# Patient Record
Sex: Female | Born: 1965 | Race: Black or African American | Hispanic: No | Marital: Married | Smoking: Never smoker
Health system: Southern US, Community
[De-identification: ages and names within clinical notes are randomized; demographics above are authoritative.]

## PROBLEM LIST (undated history)

## (undated) DIAGNOSIS — E669 Obesity, unspecified: Secondary | ICD-10-CM

## (undated) DIAGNOSIS — N83209 Unspecified ovarian cyst, unspecified side: Secondary | ICD-10-CM

## (undated) DIAGNOSIS — G4700 Insomnia, unspecified: Secondary | ICD-10-CM

## (undated) DIAGNOSIS — K922 Gastrointestinal hemorrhage, unspecified: Secondary | ICD-10-CM

## (undated) DIAGNOSIS — D649 Anemia, unspecified: Secondary | ICD-10-CM

## (undated) HISTORY — PX: TUBAL LIGATION: SHX77

## (undated) HISTORY — DX: Gastrointestinal hemorrhage, unspecified: K92.2

## (undated) HISTORY — DX: Insomnia, unspecified: G47.00

## (undated) HISTORY — DX: Unspecified ovarian cyst, unspecified side: N83.209

## (undated) HISTORY — DX: Obesity, unspecified: E66.9

## (undated) HISTORY — PX: TONSILLECTOMY: SUR1361

## (undated) HISTORY — PX: GASTRIC BYPASS: SHX52

## (undated) HISTORY — DX: Anemia, unspecified: D64.9

## (undated) HISTORY — PX: UPPER GI ENDOSCOPY: SHX6162

---

## 2004-04-28 ENCOUNTER — Ambulatory Visit: Payer: Self-pay | Admitting: Family Medicine

## 2005-02-19 ENCOUNTER — Ambulatory Visit: Payer: Self-pay

## 2006-12-24 ENCOUNTER — Ambulatory Visit: Payer: Self-pay | Admitting: Family Medicine

## 2006-12-27 ENCOUNTER — Ambulatory Visit: Payer: Self-pay | Admitting: Family Medicine

## 2007-01-31 ENCOUNTER — Ambulatory Visit: Payer: Self-pay | Admitting: Surgery

## 2007-07-10 HISTORY — PX: CHOLECYSTECTOMY: SHX55

## 2007-09-12 ENCOUNTER — Ambulatory Visit: Payer: Self-pay | Admitting: Obstetrics and Gynecology

## 2008-09-17 ENCOUNTER — Ambulatory Visit: Payer: Self-pay | Admitting: Obstetrics and Gynecology

## 2009-09-23 ENCOUNTER — Ambulatory Visit: Payer: Self-pay | Admitting: Obstetrics and Gynecology

## 2010-07-07 ENCOUNTER — Ambulatory Visit: Payer: Self-pay | Admitting: Unknown Physician Specialty

## 2010-07-14 ENCOUNTER — Ambulatory Visit: Payer: Self-pay | Admitting: Unknown Physician Specialty

## 2010-09-29 ENCOUNTER — Ambulatory Visit: Payer: Self-pay | Admitting: Obstetrics and Gynecology

## 2011-01-03 ENCOUNTER — Ambulatory Visit: Payer: Self-pay | Admitting: Family Medicine

## 2011-07-13 ENCOUNTER — Ambulatory Visit: Payer: Self-pay | Admitting: Internal Medicine

## 2011-08-17 ENCOUNTER — Ambulatory Visit: Payer: Self-pay | Admitting: Internal Medicine

## 2011-08-17 LAB — CANCER CENTER HEMOGLOBIN: HGB: 10 g/dL — ABNORMAL LOW (ref 12.0–16.0)

## 2011-09-07 ENCOUNTER — Ambulatory Visit: Payer: Self-pay | Admitting: Internal Medicine

## 2011-10-08 ENCOUNTER — Ambulatory Visit: Payer: Self-pay | Admitting: Internal Medicine

## 2011-10-26 ENCOUNTER — Ambulatory Visit: Payer: Self-pay | Admitting: Obstetrics and Gynecology

## 2011-10-31 ENCOUNTER — Ambulatory Visit: Payer: Self-pay | Admitting: Obstetrics and Gynecology

## 2011-11-09 ENCOUNTER — Ambulatory Visit: Payer: Self-pay | Admitting: Internal Medicine

## 2011-11-09 LAB — CBC CANCER CENTER
HCT: 31.3 % — ABNORMAL LOW (ref 35.0–47.0)
HGB: 9.9 g/dL — ABNORMAL LOW (ref 12.0–16.0)
Lymphocyte %: 28.5 %
MCH: 26.3 pg (ref 26.0–34.0)
MCHC: 31.8 g/dL — ABNORMAL LOW (ref 32.0–36.0)
MCV: 83 fL (ref 80–100)
Monocyte %: 10.8 %
Platelet: 219 x10 3/mm (ref 150–440)
RBC: 3.77 10*6/uL — ABNORMAL LOW (ref 3.80–5.20)
RDW: 16.1 % — ABNORMAL HIGH (ref 11.5–14.5)
WBC: 3.2 x10 3/mm — ABNORMAL LOW (ref 3.6–11.0)

## 2011-12-08 ENCOUNTER — Ambulatory Visit: Payer: Self-pay | Admitting: Internal Medicine

## 2011-12-21 LAB — CANCER CENTER HEMOGLOBIN: HGB: 11.7 g/dL — ABNORMAL LOW (ref 12.0–16.0)

## 2012-01-07 ENCOUNTER — Ambulatory Visit: Payer: Self-pay | Admitting: Internal Medicine

## 2012-02-01 LAB — CBC CANCER CENTER
Basophil #: 0 x10 3/mm (ref 0.0–0.1)
HCT: 38.3 % (ref 35.0–47.0)
Lymphocyte #: 0.9 x10 3/mm — ABNORMAL LOW (ref 1.0–3.6)
MCH: 30 pg (ref 26.0–34.0)
MCHC: 32.9 g/dL (ref 32.0–36.0)
MCV: 91 fL (ref 80–100)
Monocyte #: 0.3 x10 3/mm (ref 0.2–0.9)
Neutrophil %: 51.7 %
Platelet: 209 x10 3/mm (ref 150–440)
RBC: 4.2 10*6/uL (ref 3.80–5.20)
RDW: 16.4 % — ABNORMAL HIGH (ref 11.5–14.5)
WBC: 2.6 x10 3/mm — ABNORMAL LOW (ref 3.6–11.0)

## 2012-02-01 LAB — FERRITIN: Ferritin (ARMC): 19 ng/mL (ref 8–388)

## 2012-02-07 ENCOUNTER — Ambulatory Visit: Payer: Self-pay

## 2012-02-07 ENCOUNTER — Ambulatory Visit: Payer: Self-pay | Admitting: Internal Medicine

## 2012-03-09 ENCOUNTER — Ambulatory Visit: Payer: Self-pay | Admitting: Internal Medicine

## 2012-03-28 LAB — FERRITIN: Ferritin (ARMC): 22 ng/mL (ref 8–388)

## 2012-03-28 LAB — CANCER CENTER HEMOGLOBIN: HGB: 12.1 g/dL (ref 12.0–16.0)

## 2012-04-08 ENCOUNTER — Ambulatory Visit: Payer: Self-pay | Admitting: Internal Medicine

## 2012-04-25 LAB — FERRITIN: Ferritin (ARMC): 15 ng/mL (ref 8–388)

## 2012-04-25 LAB — CANCER CENTER HEMOGLOBIN: HGB: 12.1 g/dL (ref 12.0–16.0)

## 2012-05-09 ENCOUNTER — Ambulatory Visit: Payer: Self-pay | Admitting: Internal Medicine

## 2012-05-30 LAB — CBC CANCER CENTER
Basophil %: 0.6 %
Eosinophil #: 0.1 x10 3/mm (ref 0.0–0.7)
Eosinophil %: 3.3 %
HGB: 12.6 g/dL (ref 12.0–16.0)
Lymphocyte %: 17.9 %
MCH: 30.2 pg (ref 26.0–34.0)
MCV: 94 fL (ref 80–100)
Monocyte #: 0.4 x10 3/mm (ref 0.2–0.9)
Monocyte %: 9.8 %
Neutrophil %: 68.4 %
RBC: 4.15 10*6/uL (ref 3.80–5.20)
RDW: 13 % (ref 11.5–14.5)
WBC: 4.4 x10 3/mm (ref 3.6–11.0)

## 2012-05-30 LAB — FERRITIN: Ferritin (ARMC): 13 ng/mL (ref 8–388)

## 2012-05-30 LAB — APTT: Activated PTT: 26.4 secs (ref 23.6–35.9)

## 2012-06-08 ENCOUNTER — Ambulatory Visit: Payer: Self-pay | Admitting: Internal Medicine

## 2012-06-20 LAB — CANCER CENTER HEMOGLOBIN: HGB: 12 g/dL (ref 12.0–16.0)

## 2012-07-09 ENCOUNTER — Ambulatory Visit: Payer: Self-pay | Admitting: Internal Medicine

## 2012-07-09 HISTORY — PX: COLONOSCOPY: SHX174

## 2012-07-18 LAB — CANCER CENTER HEMOGLOBIN: HGB: 12.7 g/dL (ref 12.0–16.0)

## 2012-08-09 ENCOUNTER — Ambulatory Visit: Payer: Self-pay | Admitting: Internal Medicine

## 2012-08-15 LAB — CANCER CENTER HEMOGLOBIN: HGB: 12.5 g/dL (ref 12.0–16.0)

## 2012-09-06 ENCOUNTER — Ambulatory Visit: Payer: Self-pay | Admitting: Internal Medicine

## 2012-10-07 ENCOUNTER — Ambulatory Visit: Payer: Self-pay | Admitting: Internal Medicine

## 2012-10-10 LAB — FERRITIN: Ferritin (ARMC): 7 ng/mL — ABNORMAL LOW (ref 8–388)

## 2012-10-10 LAB — CANCER CENTER HEMOGLOBIN: HGB: 11.8 g/dL — ABNORMAL LOW (ref 12.0–16.0)

## 2012-11-06 ENCOUNTER — Ambulatory Visit: Payer: Self-pay | Admitting: Internal Medicine

## 2012-12-05 ENCOUNTER — Ambulatory Visit: Payer: Self-pay | Admitting: Obstetrics and Gynecology

## 2012-12-07 ENCOUNTER — Ambulatory Visit: Payer: Self-pay | Admitting: Internal Medicine

## 2012-12-16 ENCOUNTER — Ambulatory Visit: Payer: Self-pay | Admitting: Obstetrics and Gynecology

## 2013-01-06 ENCOUNTER — Ambulatory Visit: Payer: Self-pay | Admitting: Internal Medicine

## 2013-02-06 ENCOUNTER — Ambulatory Visit: Payer: Self-pay | Admitting: Internal Medicine

## 2013-03-05 ENCOUNTER — Ambulatory Visit: Payer: Self-pay | Admitting: Internal Medicine

## 2013-03-06 LAB — CREATININE, SERUM: Creatinine: 0.7 mg/dL (ref 0.60–1.30)

## 2013-03-06 LAB — CANCER CENTER HEMOGLOBIN: HGB: 10.8 g/dL — ABNORMAL LOW (ref 12.0–16.0)

## 2013-03-09 ENCOUNTER — Ambulatory Visit: Payer: Self-pay | Admitting: Internal Medicine

## 2013-03-16 ENCOUNTER — Other Ambulatory Visit (HOSPITAL_COMMUNITY): Payer: Self-pay | Admitting: Internal Medicine

## 2013-03-16 DIAGNOSIS — R922 Inconclusive mammogram: Secondary | ICD-10-CM

## 2013-03-20 ENCOUNTER — Inpatient Hospital Stay: Payer: Self-pay | Admitting: Internal Medicine

## 2013-03-20 LAB — FERRITIN: Ferritin (ARMC): 132 ng/mL (ref 8–388)

## 2013-03-20 LAB — COMPREHENSIVE METABOLIC PANEL
Albumin: 3 g/dL — ABNORMAL LOW (ref 3.4–5.0)
Anion Gap: 6 — ABNORMAL LOW (ref 7–16)
BUN: 20 mg/dL — ABNORMAL HIGH (ref 7–18)
Bilirubin,Total: 0.1 mg/dL — ABNORMAL LOW (ref 0.2–1.0)
Calcium, Total: 8.4 mg/dL — ABNORMAL LOW (ref 8.5–10.1)
Co2: 25 mmol/L (ref 21–32)
EGFR (African American): >60
EGFR (Non-African Amer.): >60
Glucose: 94 mg/dL (ref 65–99)
Potassium: 3.6 mmol/L (ref 3.5–5.1)
Sodium: 138 mmol/L (ref 136–145)

## 2013-03-20 LAB — CBC CANCER CENTER
Basophil #: 0 x10 3/mm (ref 0.0–0.1)
Basophil %: 0.7 %
Eosinophil #: 0.1 x10 3/mm (ref 0.0–0.7)
Eosinophil %: 2.9 %
HGB: 7.6 g/dL — ABNORMAL LOW (ref 12.0–16.0)
Lymphocyte #: 1.1 x10 3/mm (ref 1.0–3.6)
MCV: 89 fL (ref 80–100)
Monocyte %: 7.6 %
Neutrophil #: 3.3 x10 3/mm (ref 1.4–6.5)
Neutrophil %: 66.1 %
Platelet: 183 x10 3/mm (ref 150–440)
RBC: 2.56 10*6/uL — ABNORMAL LOW (ref 3.80–5.20)
RDW: 17.3 % — ABNORMAL HIGH (ref 11.5–14.5)
WBC: 4.9 x10 3/mm (ref 3.6–11.0)

## 2013-03-20 LAB — PROTIME-INR
INR: 0.9
Prothrombin Time: 12.8 secs (ref 11.5–14.7)

## 2013-03-20 LAB — IRON AND TIBC
Iron Bind.Cap.(Total): 357 ug/dL (ref 250–450)
Iron Saturation: 20 %
Iron: 72 ug/dL (ref 50–170)
Unbound Iron-Bind.Cap.: 285 ug/dL

## 2013-03-20 LAB — HEMOGLOBIN: HGB: 7.2 g/dL — ABNORMAL LOW (ref 12.0–16.0)

## 2013-03-21 LAB — OCCULT BLOOD X 1 CARD TO LAB, STOOL: Occult Blood, Feces: POSITIVE

## 2013-03-21 LAB — HEMOGLOBIN
HGB: 6.4 g/dL — ABNORMAL LOW (ref 12.0–16.0)
HGB: 8.3 g/dL — ABNORMAL LOW (ref 12.0–16.0)

## 2013-03-22 LAB — CBC WITH DIFFERENTIAL/PLATELET
Basophil #: 0 10*3/uL (ref 0.0–0.1)
Basophil %: 0.3 %
Eosinophil #: 0.1 10*3/uL (ref 0.0–0.7)
Eosinophil %: 2.1 %
HCT: 21.6 % — ABNORMAL LOW (ref 35.0–47.0)
HGB: 7.4 g/dL — ABNORMAL LOW (ref 12.0–16.0)
MCHC: 34.1 g/dL (ref 32.0–36.0)
Monocyte %: 8.2 %
Neutrophil #: 4.2 10*3/uL (ref 1.4–6.5)
RBC: 2.52 10*6/uL — ABNORMAL LOW (ref 3.80–5.20)
WBC: 5.8 10*3/uL (ref 3.6–11.0)

## 2013-03-22 LAB — BASIC METABOLIC PANEL
Anion Gap: 4 — ABNORMAL LOW (ref 7–16)
BUN: 13 mg/dL (ref 7–18)
Calcium, Total: 7.3 mg/dL — ABNORMAL LOW (ref 8.5–10.1)
Chloride: 113 mmol/L — ABNORMAL HIGH (ref 98–107)
Co2: 25 mmol/L (ref 21–32)
Creatinine: 0.66 mg/dL (ref 0.60–1.30)
EGFR (African American): >60
EGFR (Non-African Amer.): >60
Potassium: 3.6 mmol/L (ref 3.5–5.1)

## 2013-03-22 LAB — HEMOGLOBIN: HGB: 8.7 g/dL — ABNORMAL LOW (ref 12.0–16.0)

## 2013-03-23 LAB — CBC WITH DIFFERENTIAL/PLATELET
Eosinophil %: 3.3 %
HGB: 9.1 g/dL — ABNORMAL LOW (ref 12.0–16.0)
Lymphocyte %: 22 %
MCHC: 34.7 g/dL (ref 32.0–36.0)
MCV: 87 fL (ref 80–100)
Monocyte #: 0.4 x10 3/mm (ref 0.2–0.9)
Neutrophil #: 3.5 10*3/uL (ref 1.4–6.5)
Neutrophil %: 67.1 %
RBC: 3.03 10*6/uL — ABNORMAL LOW (ref 3.80–5.20)
RDW: 17.7 % — ABNORMAL HIGH (ref 11.5–14.5)

## 2013-03-23 LAB — BASIC METABOLIC PANEL
Anion Gap: 6 — ABNORMAL LOW (ref 7–16)
BUN: 5 mg/dL — ABNORMAL LOW (ref 7–18)
Calcium, Total: 7.7 mg/dL — ABNORMAL LOW (ref 8.5–10.1)
Chloride: 112 mmol/L — ABNORMAL HIGH (ref 98–107)
Co2: 24 mmol/L (ref 21–32)
EGFR (African American): >60
EGFR (Non-African Amer.): >60
Glucose: 90 mg/dL (ref 65–99)
Potassium: 3.1 mmol/L — ABNORMAL LOW (ref 3.5–5.1)

## 2013-03-23 LAB — HEMOGLOBIN: HGB: 9.4 g/dL — ABNORMAL LOW (ref 12.0–16.0)

## 2013-03-23 LAB — PREGNANCY, URINE: Pregnancy Test, Urine: NEGATIVE m[IU]/mL

## 2013-03-24 LAB — CBC WITH DIFFERENTIAL/PLATELET
Basophil #: 0 10*3/uL (ref 0.0–0.1)
Basophil %: 0.5 %
Eosinophil #: 0.2 10*3/uL (ref 0.0–0.7)
Eosinophil %: 2 %
HCT: 27.2 % — ABNORMAL LOW (ref 35.0–47.0)
Lymphocyte #: 0.8 10*3/uL — ABNORMAL LOW (ref 1.0–3.6)
Lymphocyte %: 10.6 %
MCH: 30.3 pg (ref 26.0–34.0)
MCHC: 34.5 g/dL (ref 32.0–36.0)
MCV: 88 fL (ref 80–100)
Monocyte #: 0.4 x10 3/mm (ref 0.2–0.9)
Monocyte %: 5.5 %
Neutrophil #: 6.3 10*3/uL (ref 1.4–6.5)
Neutrophil %: 81.4 %
Platelet: 189 10*3/uL (ref 150–440)
WBC: 7.7 10*3/uL (ref 3.6–11.0)

## 2013-03-26 LAB — CANCER CENTER HEMOGLOBIN: HGB: 9.9 g/dL — ABNORMAL LOW (ref 12.0–16.0)

## 2013-03-26 LAB — POTASSIUM: Potassium: 3.5 mmol/L (ref 3.5–5.1)

## 2013-03-26 LAB — MAGNESIUM: Magnesium: 2.1 mg/dL

## 2013-03-30 LAB — CANCER CENTER HEMOGLOBIN: HGB: 10.3 g/dL — ABNORMAL LOW (ref 12.0–16.0)

## 2013-03-30 LAB — POTASSIUM: Potassium: 3.9 mmol/L (ref 3.5–5.1)

## 2013-04-03 LAB — CANCER CENTER HEMOGLOBIN: HGB: 10.4 g/dL — ABNORMAL LOW (ref 12.0–16.0)

## 2013-04-03 LAB — FERRITIN: Ferritin (ARMC): 107 ng/mL (ref 8–388)

## 2013-04-08 ENCOUNTER — Ambulatory Visit: Payer: Self-pay | Admitting: Internal Medicine

## 2013-04-10 LAB — CANCER CENTER HEMOGLOBIN: HGB: 9.8 g/dL — ABNORMAL LOW (ref 12.0–16.0)

## 2013-04-10 LAB — POTASSIUM: Potassium: 3.7 mmol/L (ref 3.5–5.1)

## 2013-04-24 LAB — CANCER CENTER HEMOGLOBIN: HGB: 10.7 g/dL — ABNORMAL LOW (ref 12.0–16.0)

## 2013-04-24 LAB — POTASSIUM: Potassium: 4.1 mmol/L (ref 3.5–5.1)

## 2013-05-09 ENCOUNTER — Ambulatory Visit: Payer: Self-pay | Admitting: Internal Medicine

## 2013-05-15 LAB — FERRITIN: Ferritin (ARMC): 6 ng/mL — ABNORMAL LOW

## 2013-05-15 LAB — POTASSIUM: Potassium: 3.5 mmol/L (ref 3.5–5.1)

## 2013-05-29 LAB — POTASSIUM: Potassium: 3.5 mmol/L (ref 3.5–5.1)

## 2013-06-08 ENCOUNTER — Ambulatory Visit: Payer: Self-pay | Admitting: Internal Medicine

## 2013-06-09 ENCOUNTER — Other Ambulatory Visit: Payer: Self-pay | Admitting: Unknown Physician Specialty

## 2013-06-09 DIAGNOSIS — D509 Iron deficiency anemia, unspecified: Secondary | ICD-10-CM

## 2013-06-09 DIAGNOSIS — K639 Disease of intestine, unspecified: Secondary | ICD-10-CM

## 2013-06-12 LAB — CREATININE, SERUM
Creatinine: 0.75 mg/dL (ref 0.60–1.30)
EGFR (African American): >60

## 2013-06-12 LAB — FERRITIN: Ferritin (ARMC): 92 ng/mL (ref 8–388)

## 2013-06-12 LAB — POTASSIUM: Potassium: 4.1 mmol/L (ref 3.5–5.1)

## 2013-06-29 LAB — CANCER CENTER HEMOGLOBIN: HGB: 12 g/dL (ref 12.0–16.0)

## 2013-07-09 ENCOUNTER — Ambulatory Visit: Payer: Self-pay | Admitting: Internal Medicine

## 2013-07-09 DIAGNOSIS — K922 Gastrointestinal hemorrhage, unspecified: Secondary | ICD-10-CM

## 2013-07-09 HISTORY — PX: GASTRIC RESECTION: SHX5248

## 2013-07-09 HISTORY — DX: Gastrointestinal hemorrhage, unspecified: K92.2

## 2013-08-07 LAB — FERRITIN: FERRITIN (ARMC): 24 ng/mL (ref 8–388)

## 2013-08-07 LAB — CANCER CENTER HEMOGLOBIN: HGB: 12.7 g/dL (ref 12.0–16.0)

## 2013-08-09 ENCOUNTER — Ambulatory Visit: Payer: Self-pay | Admitting: Internal Medicine

## 2013-08-21 ENCOUNTER — Ambulatory Visit: Payer: Self-pay | Admitting: Unknown Physician Specialty

## 2013-08-21 LAB — HM COLONOSCOPY

## 2013-08-26 LAB — PATHOLOGY REPORT

## 2013-08-28 LAB — FERRITIN: FERRITIN (ARMC): 22 ng/mL (ref 8–388)

## 2013-08-28 LAB — CANCER CENTER HEMOGLOBIN: HGB: 12.5 g/dL (ref 12.0–16.0)

## 2013-09-06 ENCOUNTER — Ambulatory Visit: Payer: Self-pay | Admitting: Family Medicine

## 2013-09-06 ENCOUNTER — Ambulatory Visit: Payer: Self-pay | Admitting: Internal Medicine

## 2013-10-08 ENCOUNTER — Ambulatory Visit: Payer: Self-pay | Admitting: Internal Medicine

## 2013-10-09 LAB — FERRITIN: Ferritin (ARMC): 153 ng/mL (ref 8–388)

## 2013-10-09 LAB — CANCER CENTER HEMOGLOBIN: HGB: 12.8 g/dL (ref 12.0–16.0)

## 2013-10-16 LAB — LIPID PANEL
Cholesterol: 133 mg/dL (ref 0–200)
HDL: 59 mg/dL (ref 35–70)
LDL Cholesterol: 65 mg/dL
Triglycerides: 47 mg/dL (ref 40–160)

## 2013-10-16 LAB — HEPATIC FUNCTION PANEL
ALT: 20 U/L (ref 7–35)
AST: 22 U/L (ref 13–35)

## 2013-10-16 LAB — BASIC METABOLIC PANEL
BUN: 13 mg/dL (ref 4–21)
Creatinine: 0.8 mg/dL (ref <=1.1)
GLUCOSE: 82 mg/dL
POTASSIUM: 3.9 mmol/L (ref 3.4–5.3)
Sodium: 140 mmol/L (ref 137–147)

## 2013-11-06 ENCOUNTER — Ambulatory Visit: Payer: Self-pay | Admitting: Internal Medicine

## 2013-11-27 LAB — FERRITIN: Ferritin (ARMC): 65 ng/mL (ref 8–388)

## 2013-11-27 LAB — CANCER CENTER HEMOGLOBIN: HGB: 12.9 g/dL (ref 12.0–16.0)

## 2013-12-07 ENCOUNTER — Ambulatory Visit: Payer: Self-pay | Admitting: Internal Medicine

## 2013-12-11 ENCOUNTER — Ambulatory Visit: Payer: Self-pay | Admitting: Obstetrics and Gynecology

## 2013-12-18 ENCOUNTER — Ambulatory Visit: Payer: Self-pay | Admitting: Obstetrics and Gynecology

## 2014-01-15 ENCOUNTER — Ambulatory Visit: Payer: Self-pay | Admitting: Internal Medicine

## 2014-01-15 LAB — CANCER CENTER HEMOGLOBIN: HGB: 12.9 g/dL (ref 12.0–16.0)

## 2014-01-15 LAB — FERRITIN: Ferritin (ARMC): 61 ng/mL (ref 8–388)

## 2014-01-20 ENCOUNTER — Encounter: Payer: Self-pay | Admitting: *Deleted

## 2014-02-02 ENCOUNTER — Other Ambulatory Visit (INDEPENDENT_AMBULATORY_CARE_PROVIDER_SITE_OTHER): Payer: BC Managed Care – PPO

## 2014-02-02 ENCOUNTER — Ambulatory Visit (INDEPENDENT_AMBULATORY_CARE_PROVIDER_SITE_OTHER): Payer: BC Managed Care – PPO | Admitting: General Surgery

## 2014-02-02 ENCOUNTER — Encounter: Payer: Self-pay | Admitting: General Surgery

## 2014-02-02 ENCOUNTER — Other Ambulatory Visit: Payer: BC Managed Care – PPO

## 2014-02-02 VITALS — BP 120/80 | HR 74 | Resp 12 | Ht 64.0 in | Wt 152.0 lb

## 2014-02-02 DIAGNOSIS — N63 Unspecified lump in unspecified breast: Secondary | ICD-10-CM

## 2014-02-02 DIAGNOSIS — N6002 Solitary cyst of left breast: Secondary | ICD-10-CM

## 2014-02-02 HISTORY — PX: BREAST CYST ASPIRATION: SHX578

## 2014-02-02 NOTE — Progress Notes (Signed)
Patient ID: Laura Murray, female   DOB: 04/27/1966, 48 y.o.   MRN: [id number]  Chief Complaint  Patient presents with  . Other    New Patient evaluation of left breast mass    HPI Laura RiddleJacqueline C Bourne is a 48 y.o. female who presents for a breast evaluation. The most recent mammogram was done on 12/18/13 as well as a left breast ultrasound on 12/18/13 Patient does perform regular self breast checks and gets regular mammograms done. She was aware of any abnormalities on her on self-exam. She reports no history of trauma.  Assessment was requested by her Laura Murray M.D. Who follows the patient for anemia. The anemia is thought secondary to her previously completed the gastric bypass.     HPI  Past Medical History  Diagnosis Date  . Anemia   . Upper GI bleeding 2015    gastric ulcer    Past Surgical History  Procedure Laterality Date  . Tubal ligation    . Tonsillectomy    . Cholecystectomy  2009  . Gastric bypass  2006  . Upper gi endoscopy    . Colonoscopy  2014    Family History  Problem Relation Age of Onset  . Breast cancer Maternal Aunt     [id number]  . Colon cancer Maternal Uncle   . Lung cancer Maternal Grandfather     Social History History  Substance Use Topics  . Smoking status: Never Smoker   . Smokeless tobacco: Never Used  . Alcohol Use: No    No Known Allergies  Current Outpatient Prescriptions  Medication Sig Dispense Refill  . Vitamin D, Ergocalciferol, (DRISDOL) 50000 UNITS CAPS capsule Take 50,000 Units by mouth every 7 (seven) days.       No current facility-administered medications for this visit.    Review of Systems Review of Systems  Constitutional: Negative.   Respiratory: Negative.   Cardiovascular: Negative.     Blood pressure 120/80, pulse 74, resp. rate 12, height 5\' 4"  (1.626 m), weight 152 lb (68.947 kg), last menstrual period 12/21/2013.  Physical Exam Physical Exam  Constitutional: She is oriented to person, place,  and time. She appears well-developed and well-nourished.  Eyes: Conjunctivae are normal. No scleral icterus.  Neck: Thyromegaly present.  Cardiovascular: Regular rhythm.   Murmur heard.  Systolic murmur is present with a grade of 2/6  Pulmonary/Chest: Effort normal and breath sounds normal. Right breast exhibits no inverted nipple, no mass, no nipple discharge, no skin change and no tenderness. Left breast exhibits no inverted nipple, no mass, no nipple discharge, no skin change and no tenderness.  Lymphadenopathy:    She has no cervical adenopathy.    She has no axillary adenopathy.  Neurological: She is alert and oriented to person, place, and time.  Skin: Skin is warm and dry.    Data Reviewed Screening mammograms dated December 11, 2013 suggested a mass in the left breast. BI-RAD-0.  Focal spot compression views and ultrasound of the left breast dated December 18, 2013 showed persistent density in the lower outer quadrant of the breast with poorly defined margins. Ultrasound examination showed a 5 x 9 x 10 cm oval horizontally oriented circumscribed near anechoic masses at 3:30 o'clock position 2 cm and the nipple this was thought to account for the mammographic finding. BI-RAD-2.  Because of concern on the patient's part having been called back and the question raised by her treating hematologist she desired to have aspiration to confirm that the lesion was  indeed a cyst.  Ultrasound examination of the left breast was notable for multiple lesions. At the 2:30 o'clock position 3 cm in the nipple 2 side-by-side lesions were identified. The dominantlesion measuring 0.35 x 0.7 x 0.81 cm. The smaller cyst measured 0.15 x 0.19 x 0.29 cm. The lesion at 2:00 was abutting the pectoralis fascia and it was difficult to confirm that this was a simple cyst. Aspiration was subsequently undertaken using 1 cc of 1% plain Xylocaine with complete resolution of return of a small lime of clear fluid which was  discarded.   At the 4 o'clock position a 0.5 x 0.9 x 1.0 cm lesion 3 cm from the nipple correlated with the mammographic abnormality.  BI-RAD-2.  Assessment    Asymptomatic breast cysts, result of aspiration.     Plan    The patient will resume annual screening mammograms in summer 2016.     PCP. Dr. Unk Pinto, Merrily Pew 02/03/2014, 10:22 PM   aa

## 2014-02-03 ENCOUNTER — Encounter: Payer: Self-pay | Admitting: General Surgery

## 2014-02-03 DIAGNOSIS — N6009 Solitary cyst of unspecified breast: Secondary | ICD-10-CM | POA: Insufficient documentation

## 2014-02-06 ENCOUNTER — Ambulatory Visit: Payer: Self-pay | Admitting: Internal Medicine

## 2014-03-11 ENCOUNTER — Ambulatory Visit: Payer: Self-pay | Admitting: Internal Medicine

## 2014-03-12 LAB — APTT: ACTIVATED PTT: 28.9 s (ref 23.6–35.9)

## 2014-03-12 LAB — PLATELET FUNCTION ASSAY
COL/ADP PLT FXN SCRN: 63 s (ref 0–100)
COL/EPI PLT FXN SCRN: 75 s

## 2014-03-12 LAB — PROTIME-INR
INR: 0.9
Prothrombin Time: 12 secs (ref 11.5–14.7)

## 2014-03-12 LAB — FERRITIN: Ferritin (ARMC): 36 ng/mL (ref 8–388)

## 2014-03-12 LAB — CANCER CENTER HEMOGLOBIN: HGB: 12.3 g/dL (ref 12.0–16.0)

## 2014-04-08 ENCOUNTER — Ambulatory Visit: Payer: Self-pay | Admitting: Internal Medicine

## 2014-04-23 LAB — CBC AND DIFFERENTIAL
HCT: 37 % (ref 36–46)
HEMOGLOBIN: 12.8 g/dL (ref 12.0–16.0)
Platelets: 220 10*3/uL (ref 150–399)
WBC: 3.4 10^3/mL

## 2014-05-03 LAB — CANCER CENTER HEMOGLOBIN: HGB: 12 g/dL (ref 12.0–16.0)

## 2014-05-03 LAB — FERRITIN: FERRITIN (ARMC): 146 ng/mL (ref 8–388)

## 2014-05-09 ENCOUNTER — Ambulatory Visit: Payer: Self-pay | Admitting: Internal Medicine

## 2014-05-10 ENCOUNTER — Encounter: Payer: Self-pay | Admitting: General Surgery

## 2014-05-14 LAB — FERRITIN: FERRITIN (ARMC): 82 ng/mL (ref 8–388)

## 2014-05-14 LAB — CANCER CENTER HEMOGLOBIN: HGB: 10.7 g/dL — ABNORMAL LOW (ref 12.0–16.0)

## 2014-05-21 LAB — CANCER CENTER HEMOGLOBIN: HGB: 11.4 g/dL — AB (ref 12.0–16.0)

## 2014-05-21 LAB — FERRITIN: FERRITIN (ARMC): 508 ng/mL — AB (ref 8–388)

## 2014-06-08 ENCOUNTER — Ambulatory Visit: Payer: Self-pay | Admitting: Internal Medicine

## 2014-06-11 LAB — FERRITIN: FERRITIN (ARMC): 182 ng/mL (ref 8–388)

## 2014-06-11 LAB — CANCER CENTER HEMOGLOBIN: HGB: 11.9 g/dL — ABNORMAL LOW (ref 12.0–16.0)

## 2014-07-09 ENCOUNTER — Ambulatory Visit: Payer: Self-pay | Admitting: Internal Medicine

## 2014-08-13 ENCOUNTER — Ambulatory Visit: Payer: Self-pay | Admitting: Internal Medicine

## 2014-08-13 LAB — FERRITIN: Ferritin (ARMC): 25 ng/mL (ref 8–388)

## 2014-08-13 LAB — CANCER CENTER HEMOGLOBIN: HGB: 12.2 g/dL (ref 12.0–16.0)

## 2014-09-07 ENCOUNTER — Ambulatory Visit
Admit: 2014-09-07 | Disposition: A | Discharge status: Home / Self Care | Payer: Self-pay | Attending: Internal Medicine | Admitting: Internal Medicine

## 2014-10-08 ENCOUNTER — Ambulatory Visit
Admit: 2014-10-08 | Disposition: A | Discharge status: Home / Self Care | Payer: Self-pay | Attending: Internal Medicine | Admitting: Internal Medicine

## 2014-10-08 LAB — CANCER CENTER HEMOGLOBIN: HGB: 12.5 g/dL (ref 12.0–16.0)

## 2014-10-08 LAB — FERRITIN: FERRITIN (ARMC): 106 ng/mL

## 2014-10-29 NOTE — Consult Note (Signed)
Case discussed with Dr, Egbert GaribaldiBird and review of endo pictures.  Anastamotic erosions and ulcerations likely the problems here and the purple areas are not varices but more like erosions.  Her stools are very dark, she did have some fresh blood in anastamotic area on EGD.  Will hold off on colonoscopy and CT for now.  Agree with further transfusions if needed.  Electronic Signatures: Scot JunElliott, Annia Gomm T (MD)  (Signed on 14-Sep-14 11:42)  Authored  Last Updated: 14-Sep-14 11:42 by Scot JunElliott, Elvert Cumpton T (MD)

## 2014-10-29 NOTE — H&P (Signed)
PATIENT NAME:  Laura Murray, Jilian C MR#:  [id number] DATE OF BIRTH:  08/19/1965  HISTORY OF PRESENT ILLNESS: Ms. Laura Murray is a 49 year old patient who is known to me and who was admitted after being seen in the cancer center with signs and symptoms of GI bleed. Ms. Laura Murray has a history of iron deficiency and has been receiving intravenous iron in the past, and her iron deficiency has been attributed to menstrual blood losses and also to status post gastric bypass surgery.   Today she gave me a history that for about 8 to 10 days, her stools had been dark even though she just rarely takes an oral iron pill, and that they were really thick. Her self-description was the stool had a chocolate fudge quality, 3 bowel movements a day, and this was a change in her regular bowel habit.   Ms. Laura Murray admitted to some increased fatigue recently but she had not had or having a dizziness or orthostasis. No shortness of breath or chest pain. No palpitations. No abdominal pain. Not short of breath. See also below, her hemoglobin also was 7.6 compared to 10.8 on August 26.   REVIEW OF SYSTEMS: Denies fever, chills, or sweats. No visual disturbance. No ear or jaw pain. No headache or dizziness. No cough or wheezing. No chest or abdominal pain. No focal weakness. No numbness or tingling of extremities. No back or bone pain. No edema. No dysuria or hematuria. No hot or cold intolerance. No rash or pruritus. She remembers a bruise on the leg about a week ago but has not had other bleeding or bruising.   PAST SURGICAL AND MEDICAL HISTORY: Gastric bypass surgery, also tonsillectomy.  She has had anxiety and insomnia. [facility] The patient has been seen by GI, Dr. Mechele CollinElliott, in the past. The patient is primary care with Dr. Sherrie MustacheFisher, and gynecology is Dr. Logan BoresEvans.   ALLERGIES: No listed.   HOME MEDICATIONS: B12 injections every 8 weeks, a vitamin D3 orally, Ferrex tablets taken rarely,  calcium and vitamin D twice a day, Herbalife supplements at times. The patient has had intravenous ferriheme in the past. The last one was August 26.   FAMILY HISTORY: Noncontributory. A brother had anemia.   SOCIAL HISTORY: No alcohol or tobacco.   PHYSICAL EXAMINATION:  GENERAL: Alert, cooperative, in no distress.  HEENT: No jaundice of the sclerae. No thrush.  LYMPH: No palpable lymph nodes in the neck, supraclavicular, submandibular, axilla.  LUNGS: Clear. No wheezing or rales.  HEART: Regular.  ABDOMEN: Nontender. No palpable mass or organomegaly.  EXTREMITIES: No extremity edema.  NEUROLOGIC: Grossly nonfocal. Cranial nerves intact. There is a fading bruise on the left  thigh.   LABORATORIES: Hemoglobin was 7.6. Platelets were normal. Pro time was pending. Chemistries and stool guaiacs and iron studies are pending.   IMPRESSION AND PLAN: Signs or symptoms of gastrointestinal bleed. Not confirmed yet with stools but, with the description of stools and the hemoglobin down over 3 grams compared to August 26 even with intravenous ferriheme, no suspicion of other causes of anemia. We will guaiac the stools. We will check iron and ferritin. I have consulted Gastroenterology. Will  document the pro time. Will give normal saline and keep n.p.o. Will give Protonix. Give IV fluids, put on telemetry. We will recheck the hemoglobin later tonight and then in the morning.   Will not automatically transfuse with normal pulse, normal blood pressure, no significant symptoms and young  healthy patient, but would transfuse p.r.n. if any clinical changes.   We will also re-evaluate if additional ferriheme should be given this hospitalization. I have discussed transfusion with the patient and obtained the consent in the event that transfusion is to be given. Dr. Mechele Collin will follow up in the morning.     ____________________________ Knute Neu. Lorre Nick, MD rgg:np D: 03/20/2013 18:58:44 ET T: 03/20/2013  21:21:22 ET JOB#: [id number]  cc: Knute Neu. Lorre Nick, MD, <Dictator> Marin Roberts MD ELECTRONICALLY SIGNED 04/08/2013 11:25

## 2014-10-29 NOTE — Consult Note (Signed)
Pt CC GI bleeding.  Pt had brown stool then black stool.  Hgb up to 9.1. Will get CT to see if has cirrhosis and varices around anastamosis in case I have to do EGD for cauterization.    Electronic Signatures: Scot JunElliott, Robert T (MD)  (Signed on 15-Sep-14 12:58)  Authored  Last Updated: 15-Sep-14 12:58 by Scot JunElliott, Robert T (MD)

## 2014-10-29 NOTE — Consult Note (Signed)
CC: GI bleeding.  Pt had drop in hgb over night and went to bathroom and had syncopal spell.  Hgb 7.4, plt ct, 144,  BP low at 98/50 now.  Will likely get CT scan of abd with contrast and consider colonoscopy versus referral to med center.  Electronic Signatures: Scot JunElliott, Robert T (MD)  (Signed on 14-Sep-14 10:01)  Authored  Last Updated: 14-Sep-14 10:01 by Scot JunElliott, Robert T (MD)

## 2014-10-29 NOTE — Consult Note (Signed)
PATIENT NAME:  Laura Murray, Laura Murray MR#:  [id number] DATE OF BIRTH:  10-Jun-1966  DATE OF CONSULTATION:  03/21/2013  CONSULTING PHYSICIAN:  Scot Jun, MD  PRIMARY DOCTOR: Demetrios Isaacs. Sherrie Mustache, MD  GYNECOLOGY DOCTOR: Ricky L. Logan Bores, MD   HISTORY OF PRESENT ILLNESS: The patient is a 49 year old black female who was admitted to the hospital because of fall in hemoglobin and GI bleeding. I was asked to see her in consultation.   The patient been passing dark chocolate fudge-quality stools for the last 8 to 10 days, probably 3 bowel movements a day. She had a fall in hemoglobin. Her hemoglobin was 10.8 on August 26th, and yesterday it was 7.6. Dr. Lorre Nick decided to admit her to the hospital and consulted me.   PAST MEDICAL HISTORY:  1. Gallbladder surgery with Dr. Renda Rolls.  2. Previous endoscopy showing small dilated veins in the anastomosis in 2011. She had a colonoscopy also at [facility] that was negative at that time.  3. She also has had a tonsillectomy. 4. She has had gastric bypass surgery.   Since the bypass surgery, she has lost 100 pounds.   REVIEW OF SYSTEMS: GENERAL: She denies any fever. He has felt weak.  RESPIRATORY: No asthma, wheezing, emphysema, chronic cough or shortness of breath.  CARDIAC: No history of heart disease.  GASTROINTESTINAL: No dysphagia. No vomiting. She does move her bowels, maybe 3 times a day. She is on Herbalife smoothies, and before she started these, she was going 7 or 8 times a day, so the Herbalife smoothies have helped her diarrhea.   ALLERGIES: No known drug allergies.   MEDICATIONS:  1. B12 injection every 8 weeks.  2. Vitamin D3 3. Iron tablets, which she rarely takes.  4. Calcium and vitamin D.  5. She had an intravenous iron treatment, the last one 3 weeks ago.   FAMILY HISTORY: Brother with anemia.   PHYSICAL EXAMINATION:  GENERAL: A pleasant black female in no acute distress, lying in bed getting transfusion. Relatives  are around.  VITAL SIGNS: Blood pressure 117/74, pulse 72, temperature 98.7.  HEENT: Sclerae nonicteric. Conjunctivae slightly pale. Tongue slightly pale. The head is atraumatic.  CHEST: Clear in anterolateral fields.  HEART: Shows no murmurs or gallops I can hear.  ABDOMEN: No hepatosplenomegaly. No masses. No bruits. Bowel sounds present. No significant tenderness.  SKIN: Warm and dry.  PSYCHIATRIC: Mood and affect are appropriate.   LABORATORY DATA: When she was admitted, her hemoglobin was 7.6. It has fallen to 6.4 overnight. She had begun on transfusion. White count 4.9, platelet count 183. Total protein 6.1, albumin 3.0, total bilirubin 0.1, alkaline phosphatase 84, SGOT 25, SGPT 49, calcium of 8.4, BUN 20, creatinine 0.68. Pro time 12.8, INR 0.9. She has A positive blood with a negative antibody screen.   ASSESSMENT: Gastrointestinal bleeding with dark chocolate stools and a fall in hemoglobin of 3 units in 3 weeks. She likely has bleeding from her upper gastrointestinal tract, giving her the elevated BUN. 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 It could be that she is bleeding from one of these or some of these. She could have ulcerations at the anastomotic junction, which sometimes occur in patients with bypass surgery.   PLAN: To finish her unit of blood and do an upper endoscopy and then proceed from there. The patient will get at least 2 units of blood today.   ____________________________ Scot Jun, MD rte:OSi D: 03/21/2013 08:54:40 ET T:  03/21/2013 09:18:39 ET JOB#: [id number]  cc: Scot Junobert T. Kern Gingras, MD, <Dictator> Knute Neuobert G. Lorre NickGittin, MD Demetrios Isaacsonald E. Sherrie MustacheFisher, MD Ricky L. Logan BoresEvans, MD   Scot JunOBERT T Flemon Kelty MD ELECTRONICALLY SIGNED 04/11/2013 15:26

## 2014-10-29 NOTE — Consult Note (Signed)
CC: GI bleed from stomach.  hgb steady at 9.2, ate soft food without problems. Should be able to go home, avoid all NSAID meds, eat very soft diet, take carafate ( I gave her a prescription for it) and a PPI.  Follow up if needed.  Electronic Signatures: Scot JunElliott, Tayten Heber T (MD)  (Signed on 16-Sep-14 18:08)  Authored  Last Updated: 16-Sep-14 18:08 by Scot JunElliott, Kelie Gainey T (MD)

## 2014-10-29 NOTE — Discharge Summary (Signed)
PATIENT NAME:  Laura RiddleGRAVES, Kiffany C MR#:  [id number] DATE OF BIRTH:  1966/02/24  DATE OF ADMISSION:  03/20/2013 DATE OF DISCHARGE:  03/24/2013  FINAL DIAGNOSES: 1.  Upper gastrointestinal bleed.  2.  Hypokalemia.  3.  Underlying severe anemia due to gastrointestinal blood loss and iron deficiency.   HISTORY AND PHYSICAL:  Dictated on admission.   PROCEDURES: 1.  Packed red blood cells transfused. 2.  Endoscopy performed by Dr. Mechele CollinElliott.   HOSPITAL COURSE:  The patient was hospitalized, given IV fluids, given IV Protonix and was made n.p.o. Stools were guaiac. GI was consulted. The patient received transfusion secondary to symptomatic anemia and active bleed and later felt better with hemoglobin 9.4. EGD performed and there were scattered areas of mucosal inflammation, small amounts of scattered blood without a single obvious bleeding source, but there are also multiple areas of dilated submucosal veins around the anastomosis.   Patient diet was advanced to a mechanical soft diet. Potassium was replaced with banana, avoiding oral potassium.  Oral iron was also to be avoided. At the time of discharge, the patient was alert and cooperative and recovered from symptomatic anemia. The lungs were clear.  Heart was regular.  Abdomen was nontender.  There was no edema.  Neuro exam was grossly nonfocal.  The heart was regular.  There was no rash.  DISCHARGE MEDICATIONS:  Include Vitamin D 3000 units once a day and calcium plus vitamin D twice a day and multivitamin daily, as well as B12 injections every 8 weeks and Carafate was added at 1 gram suspension orally to take 4 times a day. The patient was also recommended for a banana twice a day to replace potassium.  Prior to discharge on the day of discharge, 1 dose of 40 mEq oral potassium was given. The patient had an appointment within 2 days to be seen in the Cancer Center for monitoring of hemoglobin and of potassium and to watch for possible requirement  for intravenous iron support p.r.n.   Follow up with Dr. Mechele CollinElliott or primary bypass surgeon was to be arranged.     ____________________________ Knute Neuobert G. Lorre NickGittin, MD rgg:ce D: 04/08/2013 11:46:40 ET T: 04/08/2013 12:14:48 ET JOB#: [id number]  cc: Knute Neuobert G. Lorre NickGittin, MD, <Dictator> Marin RobertsOBERT G Michial Disney MD ELECTRONICALLY SIGNED 05/03/2013 19:58

## 2014-11-25 ENCOUNTER — Other Ambulatory Visit: Payer: Self-pay | Admitting: Obstetrics and Gynecology

## 2014-11-25 DIAGNOSIS — Z1231 Encounter for screening mammogram for malignant neoplasm of breast: Secondary | ICD-10-CM

## 2014-12-17 ENCOUNTER — Ambulatory Visit
Admission: RE | Admit: 2014-12-17 | Discharge: 2014-12-17 | Disposition: A | Discharge status: Home / Self Care | Payer: BLUE CROSS/BLUE SHIELD | Source: Ambulatory Visit | Attending: Obstetrics and Gynecology | Admitting: Obstetrics and Gynecology

## 2014-12-17 DIAGNOSIS — Z1231 Encounter for screening mammogram for malignant neoplasm of breast: Secondary | ICD-10-CM | POA: Diagnosis present

## 2014-12-20 DIAGNOSIS — E669 Obesity, unspecified: Secondary | ICD-10-CM | POA: Insufficient documentation

## 2014-12-20 DIAGNOSIS — N83209 Unspecified ovarian cyst, unspecified side: Secondary | ICD-10-CM | POA: Insufficient documentation

## 2014-12-20 DIAGNOSIS — G4700 Insomnia, unspecified: Secondary | ICD-10-CM | POA: Insufficient documentation

## 2014-12-20 DIAGNOSIS — E559 Vitamin D deficiency, unspecified: Secondary | ICD-10-CM | POA: Insufficient documentation

## 2014-12-20 DIAGNOSIS — E668 Other obesity: Secondary | ICD-10-CM | POA: Insufficient documentation

## 2014-12-20 DIAGNOSIS — D500 Iron deficiency anemia secondary to blood loss (chronic): Secondary | ICD-10-CM | POA: Insufficient documentation

## 2014-12-20 DIAGNOSIS — K529 Noninfective gastroenteritis and colitis, unspecified: Secondary | ICD-10-CM | POA: Insufficient documentation

## 2014-12-20 DIAGNOSIS — N920 Excessive and frequent menstruation with regular cycle: Secondary | ICD-10-CM | POA: Insufficient documentation

## 2014-12-20 DIAGNOSIS — I864 Gastric varices: Secondary | ICD-10-CM | POA: Insufficient documentation

## 2014-12-20 DIAGNOSIS — E538 Deficiency of other specified B group vitamins: Secondary | ICD-10-CM | POA: Insufficient documentation

## 2014-12-20 DIAGNOSIS — Z8639 Personal history of other endocrine, nutritional and metabolic disease: Secondary | ICD-10-CM | POA: Insufficient documentation

## 2014-12-24 ENCOUNTER — Ambulatory Visit (INDEPENDENT_AMBULATORY_CARE_PROVIDER_SITE_OTHER): Payer: BLUE CROSS/BLUE SHIELD

## 2014-12-24 DIAGNOSIS — E538 Deficiency of other specified B group vitamins: Secondary | ICD-10-CM | POA: Diagnosis not present

## 2014-12-24 MED ORDER — CYANOCOBALAMIN 1000 MCG/ML IJ SOLN
1000.0000 ug | Freq: Once | INTRAMUSCULAR | Status: AC
  Administered 2014-12-24: 1000 ug via INTRAMUSCULAR

## 2014-12-24 NOTE — Progress Notes (Signed)
b12 injection at office today. Tolerated well. Will come back in 2 months per MD's order.,

## 2014-12-30 ENCOUNTER — Other Ambulatory Visit: Payer: Self-pay

## 2014-12-30 DIAGNOSIS — D509 Iron deficiency anemia, unspecified: Secondary | ICD-10-CM

## 2014-12-31 ENCOUNTER — Inpatient Hospital Stay (HOSPITAL_BASED_OUTPATIENT_CLINIC_OR_DEPARTMENT_OTHER): Payer: BLUE CROSS/BLUE SHIELD | Admitting: Family Medicine

## 2014-12-31 ENCOUNTER — Inpatient Hospital Stay: Payer: BLUE CROSS/BLUE SHIELD

## 2014-12-31 ENCOUNTER — Inpatient Hospital Stay: Payer: BLUE CROSS/BLUE SHIELD | Attending: Family Medicine

## 2014-12-31 VITALS — BP 115/75 | HR 61 | Temp 96.4°F | Wt 166.7 lb

## 2014-12-31 DIAGNOSIS — Z79899 Other long term (current) drug therapy: Secondary | ICD-10-CM

## 2014-12-31 DIAGNOSIS — D509 Iron deficiency anemia, unspecified: Secondary | ICD-10-CM | POA: Diagnosis present

## 2014-12-31 DIAGNOSIS — Z9884 Bariatric surgery status: Secondary | ICD-10-CM

## 2014-12-31 DIAGNOSIS — N920 Excessive and frequent menstruation with regular cycle: Secondary | ICD-10-CM | POA: Diagnosis not present

## 2014-12-31 LAB — HEMOGLOBIN: Hemoglobin: 12.3 g/dL (ref 12.0–16.0)

## 2014-12-31 LAB — FERRITIN: FERRITIN: 63 ng/mL (ref 11–307)

## 2014-12-31 NOTE — Progress Notes (Signed)
[facility]  Telephone:([phone]) [phone]  [phone]     Laura Murray DOB: September 21, 1965  MR#: [id number]  QKS#:[id number]  Patient Care Team: Malva Limes, MD as PCP - General (Family Medicine) Marin Roberts, MD as Referring Physician (Internal Medicine) Earline Mayotte, MD as Consulting Physician (General Surgery)  CHIEF COMPLAINT:  Chief Complaint  Patient presents with  . Follow-up   Call patient with significant history of iron deficiency anemia, previously attributed to regular menstrual loss and gastric 5 past history. Chest that has history of upper GI bleeding, lesion seen on endoscopy, last follow-up in 2 with EGD and cauterization by Dr. Landis Gandy in December 2015. Patient also has history of benign cyclic neutropenia.  INTERVAL HISTORY:  Patient is here for continued evaluation regarding IDA. She has received Feraheme in the past for symptomatic anemia. She has a history of anemia related to menstrual cycles, as well as she is status post bypass surgery, and has a known history of GI bleeding due to small ulcerated areas stomach. Her plan of Dr. Paula Compton advised to keep ferritin greater than 50. She overall feels well today and denies any complaints.  REVIEW OF SYSTEMS:   Review of Systems  Constitutional: Negative for fever, chills, weight loss, malaise/fatigue and diaphoresis.  HENT: Negative for congestion, ear discharge, ear pain, hearing loss, nosebleeds, sore throat and tinnitus.   Eyes: Negative for blurred vision, double vision, photophobia, pain, discharge and redness.  Respiratory: Negative for cough, hemoptysis, sputum production, shortness of breath, wheezing and stridor.   Cardiovascular: Negative for chest pain, palpitations, orthopnea, claudication, leg swelling and PND.  Gastrointestinal: Negative for heartburn, nausea, vomiting, abdominal pain, diarrhea, constipation, blood in stool and melena.  Genitourinary: Negative.     Musculoskeletal: Negative.   Skin: Negative.   Neurological: Negative for dizziness, tingling, focal weakness, seizures, weakness and headaches.  Endo/Heme/Allergies: Does not bruise/bleed easily.  Psychiatric/Behavioral: Negative for depression. The patient is not nervous/anxious and does not have insomnia.     As per HPI. Otherwise, a complete review of systems is negatve.  ONCOLOGY HISTORY:  No history exists.    PAST MEDICAL HISTORY: Past Medical History  Diagnosis Date  . Anemia   . Upper GI bleeding 2015    gastric ulcer  . Vitamin B12 deficiency   . Vitamin D deficiency   . Insomnia   . Ovarian cyst   . Obesity     PAST SURGICAL HISTORY: Past Surgical History  Procedure Laterality Date  . Tubal ligation    . Tonsillectomy    . Cholecystectomy  2009  . Gastric bypass  2006  . Upper gi endoscopy    . Colonoscopy  2014    FAMILY HISTORY Family History  Problem Relation Age of Onset  . Breast cancer Maternal Aunt     [id number]  . Colon cancer Maternal Uncle   . Lung cancer Maternal Grandfather   . Cancer Maternal Grandfather   . Diabetes Mother   . Hypertension Mother   . Diabetes Father   . Hypertension Father   . Crohn's disease Son   . Rheum arthritis Son   . Asthma Son   . Cancer Maternal Grandmother     GYNECOLOGIC HISTORY:  Patient's last menstrual period was 12/01/2014.     ADVANCED DIRECTIVES:    HEALTH MAINTENANCE: History  Substance Use Topics  . Smoking status: Never Smoker   . Smokeless tobacco: Never Used  . Alcohol Use: No  Colonoscopy:  PAP:  Bone density:  Lipid panel:  No Known Allergies  Current Outpatient Prescriptions  Medication Sig Dispense Refill  . amitriptyline (ELAVIL) 25 MG tablet Take 12.5 mg by mouth at bedtime as needed for sleep.    . cyanocobalamin (,VITAMIN B-12,) 1000 MCG/ML injection Inject into the muscle.    . MULTIPLE VITAMIN PO Take by mouth.    . pantoprazole (PROTONIX) 20 MG tablet Take  by mouth.    . Vitamin D, Ergocalciferol, (DRISDOL) 50000 UNITS CAPS capsule Take 50,000 Units by mouth every 7 (seven) days.    . calcium carbonate (OS-CAL) 600 MG TABS tablet Take by mouth.    . nystatin-triamcinolone ointment (MYCOLOG) Apply topically.     No current facility-administered medications for this visit.    OBJECTIVE: BP 115/75 mmHg  Pulse 61  Temp(Src) 96.4 F (35.8 C) (Tympanic)  Wt 166 lb 10.7 oz (75.6 kg)  LMP 12/01/2014   Body mass index is 28.59 kg/(m^2).    ECOG FS:0 - Asymptomatic  General: Well-developed, well-nourished, no acute distress. Eyes: Pink conjunctiva, anicteric sclera. HEENT: Normocephalic, moist mucous membranes, clear oropharnyx. Lungs: Clear to auscultation bilaterally. Heart: Regular rate and rhythm. No rubs, murmurs, or gallops. Abdomen: Soft, nontender, nondistended. No organomegaly noted, normoactive bowel sounds. Musculoskeletal: No edema, cyanosis, or clubbing. Neuro: Alert, answering all questions appropriately. Cranial nerves grossly intact. Skin: No rashes or petechiae noted. Psych: Normal affect. Lymphatics: No cervical, calvicular, axillary or inguinal LAD.   LAB RESULTS:  Appointment on 12/31/2014  Component Date Value Ref Range Status  . Hemoglobin 12/31/2014 12.3  12.0 - 16.0 g/dL Final    STUDIES: No results found.  ASSESSMENT:  IDA  PLAN:   1. IDA. Secondary to menstrual loss as well as gastric bypass surgery. Patient has had complete GI workup in regards to previous GI bleeds. She does receive B12 injections with primary care provider. Hemoglobin today is 12.3 and ferritin level is 63, patient denies any signs or symptoms. We will schedule for routine follow-up in 4 months. Patient is to contact us with any new signs or symptoms or if she feels that her iron and ferritin need to be rechecked.  Patient expressed understanding and was in agreement with this plan. She also understands that She can call clinic at any  time with any questions, concerns, or complaints.   Dr. Doylene Canning was available for consultation and review of plan of care for this patient.   Loann Quill, NP   12/31/2014 9:21 AM

## 2015-01-21 ENCOUNTER — Ambulatory Visit: Payer: BLUE CROSS/BLUE SHIELD

## 2015-02-18 ENCOUNTER — Ambulatory Visit (INDEPENDENT_AMBULATORY_CARE_PROVIDER_SITE_OTHER): Payer: BLUE CROSS/BLUE SHIELD

## 2015-02-18 DIAGNOSIS — E538 Deficiency of other specified B group vitamins: Secondary | ICD-10-CM

## 2015-02-18 MED ORDER — CYANOCOBALAMIN 1000 MCG/ML IJ SOLN
1000.0000 ug | Freq: Once | INTRAMUSCULAR | Status: AC
  Administered 2015-02-18: 1000 ug via INTRAMUSCULAR

## 2015-02-18 NOTE — Progress Notes (Signed)
Vitamin B12 injection today at office. Tolerated well. Will come back in 2 months per MD's order.

## 2015-04-22 ENCOUNTER — Ambulatory Visit: Payer: BLUE CROSS/BLUE SHIELD

## 2015-04-22 ENCOUNTER — Ambulatory Visit: Payer: BLUE CROSS/BLUE SHIELD | Admitting: Family Medicine

## 2015-05-02 ENCOUNTER — Other Ambulatory Visit: Payer: BLUE CROSS/BLUE SHIELD

## 2015-05-02 ENCOUNTER — Ambulatory Visit: Payer: BLUE CROSS/BLUE SHIELD | Admitting: Internal Medicine

## 2015-05-06 ENCOUNTER — Inpatient Hospital Stay (HOSPITAL_BASED_OUTPATIENT_CLINIC_OR_DEPARTMENT_OTHER): Payer: BLUE CROSS/BLUE SHIELD | Admitting: Internal Medicine

## 2015-05-06 ENCOUNTER — Inpatient Hospital Stay: Payer: BLUE CROSS/BLUE SHIELD | Attending: Internal Medicine

## 2015-05-06 VITALS — BP 134/80 | HR 64 | Temp 94.8°F | Ht 64.0 in | Wt 157.0 lb

## 2015-05-06 DIAGNOSIS — E669 Obesity, unspecified: Secondary | ICD-10-CM | POA: Diagnosis not present

## 2015-05-06 DIAGNOSIS — D509 Iron deficiency anemia, unspecified: Secondary | ICD-10-CM

## 2015-05-06 DIAGNOSIS — N920 Excessive and frequent menstruation with regular cycle: Secondary | ICD-10-CM

## 2015-05-06 DIAGNOSIS — E559 Vitamin D deficiency, unspecified: Secondary | ICD-10-CM | POA: Insufficient documentation

## 2015-05-06 DIAGNOSIS — Z9884 Bariatric surgery status: Secondary | ICD-10-CM | POA: Diagnosis not present

## 2015-05-06 DIAGNOSIS — Z79899 Other long term (current) drug therapy: Secondary | ICD-10-CM | POA: Diagnosis not present

## 2015-05-06 LAB — IRON AND TIBC
Iron: 58 ug/dL (ref 28–170)
SATURATION RATIOS: 18 % (ref 10.4–31.8)
TIBC: 329 ug/dL (ref 250–450)
UIBC: 271 ug/dL

## 2015-05-06 LAB — FERRITIN: FERRITIN: 68 ng/mL (ref 11–307)

## 2015-05-06 LAB — HEMOGLOBIN: Hemoglobin: 12.2 g/dL (ref 12.0–16.0)

## 2015-05-06 NOTE — Progress Notes (Signed)
[facility] OFFICE PROGRESS NOTE  Patient Care Team: Malva Limesonald E [name], MD as PCP - General (Family Medicine) Marin Robertsobert G Gittin, MD as Referring Physician (Internal Medicine) Earline MayotteJeffrey W Byrnett, MD as Consulting Physician (General Surgery)   SUMMARY OF ONCOLOGIC HISTORY:  # IDA [place] Breeding[sec to gastric Bypass 2002/ menstrual blood loss]  INTERVAL HISTORY:  A very pleasant 49 year old female patient with above history of 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 Patient's last IV iron was approximately 6 months ago.  Patient denies any fatigue. Denies any blood in stools black stools. She does not take by mouth iron because of poor tolerance. No nausea no vomiting no weight loss.   REVIEW OF SYSTEMS:  A complete 10 point review of system is done which is negative except mentioned above/history of present illness.   PAST MEDICAL HISTORY :  Past Medical History  Diagnosis Date  . Anemia   . Upper GI bleeding 2015    gastric ulcer  . Vitamin B12 deficiency   . Vitamin D deficiency   . Insomnia   . Ovarian cyst   . Obesity     PAST SURGICAL HISTORY :   Past Surgical History  Procedure Laterality Date  . Tubal ligation    . Tonsillectomy    . Cholecystectomy  2009  . Gastric bypass  2006  . Upper gi endoscopy    . Colonoscopy  2014    FAMILY HISTORY :   Family History  Problem Relation Age of Onset  . Breast cancer Maternal Aunt     [id number]  . Colon cancer Maternal Uncle   . Lung cancer Maternal Grandfather   . Cancer Maternal Grandfather   . Diabetes Mother   . Hypertension Mother   . Diabetes Father   . Hypertension Father   . Crohn's disease Son   . Rheum arthritis Son   . Asthma Son   . Cancer Maternal Grandmother     SOCIAL HISTORY:   Social History  Substance Use Topics  . Smoking status: Never Smoker   . Smokeless tobacco: Never Used  . Alcohol Use: No    ALLERGIES:  has No Known  Allergies.  MEDICATIONS:  Current Outpatient Prescriptions  Medication Sig Dispense Refill  . cyanocobalamin (,VITAMIN B-12,) 1000 MCG/ML injection Inject into the muscle.    . Vitamin D, Ergocalciferol, (DRISDOL) 50000 UNITS CAPS capsule Take 50,000 Units by mouth every 7 (seven) days.     No current facility-administered medications for this visit.    PHYSICAL EXAMINATION: ECOG PERFORMANCE STATUS: 0 - Asymptomatic  BP 134/80 mmHg  Pulse 64  Temp(Src) 94.8 F (34.9 C) (Tympanic)  Ht 5\' 4"  (1.626 m)  Wt 156 lb 15.5 oz (71.2 kg)  BMI 26.93 kg/m2  Filed Weights   05/06/15 0931 05/06/15 0937  Weight: 156 lb 15.5 oz (71.2 kg) 156 lb 15.5 oz (71.2 kg)    GENERAL: Well-nourished well-developed; Alert, no distress and comfortable.   Alone. EYES: no pallor or icterus OROPHARYNX: no thrush or ulceration; good dentition  NECK: supple, no masses felt LYMPH:  no palpable lymphadenopathy in the cervical, axillary or inguinal regions LUNGS: clear to auscultation and  No wheeze or crackles HEART/CVS: regular rate & rhythm and no murmurs; No lower extremity edema ABDOMEN:abdomen soft, non-tender and normal bowel sounds Musculoskeletal:no cyanosis of digits and no clubbing  PSYCH: alert & oriented x 3 with fluent speech NEURO: no focal motor/sensory deficits SKIN:  no rashes or significant  lesions  LABORATORY DATA:  I have reviewed the data as listed    Component Value Date/Time   NA 140 10/16/2013   NA 142 03/23/2013 0428   K 3.9 10/16/2013   K 4.1 06/29/2013 0902   CL 112* 03/23/2013 0428   CO2 24 03/23/2013 0428   GLUCOSE 90 03/23/2013 0428   BUN 13 10/16/2013   BUN 5* 03/23/2013 0428   CREATININE 0.8 10/16/2013   CREATININE 0.75 06/12/2013 0842   CALCIUM 7.7* 03/23/2013 0428   PROT 6.1* 03/20/2013 1843   ALBUMIN 3.0* 03/20/2013 1843   AST 22 10/16/2013   AST 25 03/20/2013 1843   ALT 20 10/16/2013   ALT 49 03/20/2013 1843   ALKPHOS 84 03/20/2013 1843   BILITOT 0.1*  03/20/2013 1843   GFRNONAA >60 06/12/2013 0842   GFRAA >60 06/12/2013 0842    No results found for: SPEP, UPEP  Lab Results  Component Value Date   WBC 3.4 04/23/2014   NEUTROABS 6.3 03/24/2013   HGB 12.2 05/06/2015   HCT 37 04/23/2014   MCV 88 03/24/2013   PLT 220 04/23/2014      Chemistry      Component Value Date/Time   NA 140 10/16/2013   NA 142 03/23/2013 0428   K 3.9 10/16/2013   K 4.1 06/29/2013 0902   CL 112* 03/23/2013 0428   CO2 24 03/23/2013 0428   BUN 13 10/16/2013   BUN 5* 03/23/2013 0428   CREATININE 0.8 10/16/2013   CREATININE 0.75 06/12/2013 0842   GLU 82 10/16/2013      Component Value Date/Time   CALCIUM 7.7* 03/23/2013 0428   ALKPHOS 84 03/20/2013 1843   AST 22 10/16/2013   AST 25 03/20/2013 1843   ALT 20 10/16/2013   ALT 49 03/20/2013 1843   BILITOT 0.1* 03/20/2013 1843       RADIOGRAPHIC STUDIES: I have personally reviewed the radiological images as listed and agreed with the findings in the report. No results found.   ASSESSMENT & PLAN:   # 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 Hemoglobin is 12.3 today. Ferritin pending. Discussed with the patient that if the ferritin is low; recommend IV iron. Poor tolerance to by mouth iron secondary to constipation.  # Menorrhagia- recommend reevaluation with gynecology.  # Recommend follow-up otherwise in 6 months/labs 1 week prior to next visit; IV iron infusion again if needed.  No orders of the defined types were placed in this encounter.   All questions were answered. The patient knows to call the clinic with any problems, questions or concerns. No barriers to learning was detected.  I spent 15 minutes counseling the patient face to face. The total time spent in the appointment was 30 minutes and more than 50% was on counseling and review of test results     Earna Coder, MD 05/06/2015 9:46 AM

## 2015-05-20 ENCOUNTER — Ambulatory Visit (INDEPENDENT_AMBULATORY_CARE_PROVIDER_SITE_OTHER): Payer: BLUE CROSS/BLUE SHIELD

## 2015-05-20 DIAGNOSIS — E538 Deficiency of other specified B group vitamins: Secondary | ICD-10-CM

## 2015-05-20 MED ORDER — CYANOCOBALAMIN 1000 MCG/ML IJ SOLN
1000.0000 ug | Freq: Once | INTRAMUSCULAR | Status: AC
  Administered 2015-05-20: 1000 ug via INTRAMUSCULAR

## 2015-06-10 ENCOUNTER — Ambulatory Visit (INDEPENDENT_AMBULATORY_CARE_PROVIDER_SITE_OTHER): Payer: BLUE CROSS/BLUE SHIELD | Admitting: Family Medicine

## 2015-06-10 ENCOUNTER — Encounter: Payer: Self-pay | Admitting: Family Medicine

## 2015-06-10 VITALS — BP 120/64 | HR 64 | Temp 98.6°F | Resp 16 | Ht 64.0 in | Wt 157.0 lb

## 2015-06-10 DIAGNOSIS — E538 Deficiency of other specified B group vitamins: Secondary | ICD-10-CM

## 2015-06-10 DIAGNOSIS — R5383 Other fatigue: Secondary | ICD-10-CM

## 2015-06-10 DIAGNOSIS — E559 Vitamin D deficiency, unspecified: Secondary | ICD-10-CM | POA: Diagnosis not present

## 2015-06-10 DIAGNOSIS — G4700 Insomnia, unspecified: Secondary | ICD-10-CM | POA: Diagnosis not present

## 2015-06-10 DIAGNOSIS — Z13220 Encounter for screening for lipoid disorders: Secondary | ICD-10-CM

## 2015-06-10 MED ORDER — AMITRIPTYLINE HCL 25 MG PO TABS: 25.0000 mg | ORAL_TABLET | Freq: Every day | ORAL | Status: DC

## 2015-06-10 MED ORDER — AMITRIPTYLINE HCL 25 MG PO TABS: 50.0000 mg | ORAL_TABLET | Freq: Every day | ORAL | Status: DC

## 2015-06-10 NOTE — Progress Notes (Signed)
Patient: Laura Murray Female    DOB: April 29, 1966   49 y.o.   MRN: [id number] Visit Date: 06/10/2015  Today's Provider: Mila Merry, MD   Chief Complaint  Patient presents with  . Follow-up   Subjective:    HPI  B12 deficiency She continues QOM b12 injections with history of gastric bypass surgery. She had normal hbg at hematology in October. Is tolerating injections, but feels fatigued, which she attributes to difficulty sleeping  Iron deficiency She continues regular follow up with hematology and was last seen in October with normal iron studies. She occasionally requires parenteral iron infusions.  Insomnia She states she was prescribed amitriptyline by Dr. Tommi Rumps last year which initially worked well, but has not been nearly as effective the last several months. She only takes it a couple of days each week. It does help her fall asleep, but she wakes up after 3-4 hours and can't get back to sleep.   Lab Results  Component Value Date   FERRITIN 68 05/06/2015   Lab Results  Component Value Date   HGB 12.2 05/06/2015    No Known Allergies Previous Medications   AMITRIPTYLINE (ELAVIL) 25 MG TABLET    Take 25 mg by mouth at bedtime.   CYANOCOBALAMIN (,VITAMIN B-12,) 1000 MCG/ML INJECTION    Inject into the muscle.   VITAMIN D, ERGOCALCIFEROL, (DRISDOL) 50000 UNITS CAPS CAPSULE    Take 50,000 Units by mouth every 7 (seven) days.    Review of Systems  Constitutional: Positive for fatigue. Negative for fever, chills and appetite change.  Respiratory: Negative for chest tightness and shortness of breath.   Cardiovascular: Negative for chest pain and palpitations.  Gastrointestinal: Negative for nausea, vomiting and abdominal pain.  Neurological: Negative for dizziness and weakness.  Psychiatric/Behavioral: Positive for sleep disturbance.    Social History  Substance Use Topics  . Smoking status: Never Smoker   . Smokeless tobacco: Never Used  .  Alcohol Use: No   Objective:   BP 120/64 mmHg  Pulse 64  Temp(Src) 98.6 F (37 C) (Oral)  Resp 16  Ht  (1.626 m)  Wt 157 lb (71.215 kg)  BMI 26.94 kg/m2  SpO2 99%  LMP 05/30/2015   Depression screen PHQ 2/9 06/10/2015  Decreased Interest 0  Down, Depressed, Hopeless 0  PHQ - 2 Score 0  Altered sleeping 3  Tired, decreased energy 2  Change in appetite 1  Feeling bad or failure about yourself  1  Trouble concentrating 0  Moving slowly or fidgety/restless 0  Suicidal thoughts 0  PHQ-9 Score 7  Difficult doing work/chores Not difficult at all      Physical Exam  General appearance: alert, well developed, well nourished, cooperative and in no distress Head: Normocephalic, without obvious abnormality, atraumatic Lungs: Respirations even and unlabored Extremities: No gross deformities Skin: Skin color, texture, turgor normal. No rashes seen  Psych: Appropriate mood and affect. Neurologic: Mental status: Alert, oriented to person, place, and time, thought content appropriate.     Assessment & Plan:     1. Insomnia amitriptlyine helping to fall asleep but only helping for a few hours. She does not want to take anything habit forming. Will try increase to 2 at bedtime. If not effective consider trazodone and melatonin.  - amitriptyline (ELAVIL) 25 MG tablet; Take 2 tablet (25 mg total) by mouth at bedtime.  Dispense: 1 tablet; Refill: 0  2. B12 deficiency  - Vitamin B12  3.  Avitaminosis D  - VITAMIN D 25 Hydroxy (Vit-D Deficiency, Fractures)  4. Other fatigue  - TSH - Comprehensive metabolic panel  5. Lipid screening  - Lipid panel   She states she had her flu shot at work.       Mila Merryonald Madailein Londo, MD  [facility]

## 2015-06-11 LAB — COMPREHENSIVE METABOLIC PANEL
ALT: 28 IU/L (ref 0–32)
AST: 36 IU/L (ref 0–40)
Albumin/Globulin Ratio: 1.4 (ref 1.1–2.5)
Albumin: 3.9 g/dL (ref 3.5–5.5)
Alkaline Phosphatase: 78 IU/L (ref 39–117)
BILIRUBIN TOTAL: 0.3 mg/dL (ref 0.0–1.2)
BUN/Creatinine Ratio: 14 (ref 9–23)
BUN: 11 mg/dL (ref 6–24)
CHLORIDE: 103 mmol/L (ref 97–106)
CO2: 24 mmol/L (ref 18–29)
Calcium: 9.1 mg/dL (ref 8.7–10.2)
Creatinine, Ser: 0.76 mg/dL (ref 0.57–1.00)
GFR calc Af Amer: 108 mL/min/{1.73_m2} (ref >59)
GFR calc non Af Amer: 94 mL/min/{1.73_m2} (ref >59)
GLUCOSE: 86 mg/dL (ref 65–99)
Globulin, Total: 2.7 g/dL (ref 1.5–4.5)
POTASSIUM: 4.5 mmol/L (ref 3.5–5.2)
Sodium: 141 mmol/L (ref 136–144)
TOTAL PROTEIN: 6.6 g/dL (ref 6.0–8.5)

## 2015-06-11 LAB — LIPID PANEL
CHOL/HDL RATIO: 2.1 ratio (ref 0.0–4.4)
Cholesterol, Total: 142 mg/dL (ref 100–199)
HDL: 69 mg/dL (ref >39)
LDL Calculated: 65 mg/dL (ref 0–99)
Triglycerides: 39 mg/dL (ref 0–149)
VLDL Cholesterol Cal: 8 mg/dL (ref 5–40)

## 2015-06-11 LAB — VITAMIN D 25 HYDROXY (VIT D DEFICIENCY, FRACTURES): Vit D, 25-Hydroxy: 29.8 ng/mL — ABNORMAL LOW (ref 30.0–100.0)

## 2015-06-11 LAB — TSH: TSH: 1.44 u[IU]/mL (ref 0.450–4.500)

## 2015-06-11 LAB — VITAMIN B12: VITAMIN B 12: 392 pg/mL (ref 211–946)

## 2015-07-05 ENCOUNTER — Other Ambulatory Visit: Payer: Self-pay | Admitting: Family Medicine

## 2015-07-05 NOTE — Telephone Encounter (Signed)
See refill message

## 2015-08-08 ENCOUNTER — Other Ambulatory Visit: Payer: Self-pay | Admitting: Family Medicine

## 2015-08-08 DIAGNOSIS — G4700 Insomnia, unspecified: Secondary | ICD-10-CM

## 2015-08-08 NOTE — Telephone Encounter (Signed)
Does this mean she wants to change to  once a day? If so can send in 90 with 3 refills.

## 2015-08-08 NOTE — Telephone Encounter (Signed)
Pt contacted office for refill request on the following medications: amitriptyline (ELAVIL) 25 MG tablet to [facility]. Pt stated that when she was in the office on 06/10/15 she was advised to increase the medication to 2 pills at bedtime. Pt would like the RX to be increased to that dosage. Thanks TNP

## 2015-08-12 MED ORDER — AMITRIPTYLINE HCL 50 MG PO TABS: 50.0000 mg | ORAL_TABLET | Freq: Every day | ORAL | Status: DC

## 2015-09-23 ENCOUNTER — Ambulatory Visit (INDEPENDENT_AMBULATORY_CARE_PROVIDER_SITE_OTHER): Payer: BLUE CROSS/BLUE SHIELD

## 2015-09-23 DIAGNOSIS — E538 Deficiency of other specified B group vitamins: Secondary | ICD-10-CM | POA: Diagnosis not present

## 2015-09-23 MED ORDER — CYANOCOBALAMIN 1000 MCG/ML IJ SOLN
1000.0000 ug | Freq: Once | INTRAMUSCULAR | Status: AC
  Administered 2015-09-23: 1000 ug via INTRAMUSCULAR

## 2015-10-21 ENCOUNTER — Ambulatory Visit (INDEPENDENT_AMBULATORY_CARE_PROVIDER_SITE_OTHER): Payer: BLUE CROSS/BLUE SHIELD

## 2015-10-21 DIAGNOSIS — E538 Deficiency of other specified B group vitamins: Secondary | ICD-10-CM | POA: Diagnosis not present

## 2015-10-21 MED ORDER — CYANOCOBALAMIN 1000 MCG/ML IJ SOLN
1000.0000 ug | Freq: Once | INTRAMUSCULAR | Status: AC
  Administered 2015-10-21: 1000 ug via INTRAMUSCULAR

## 2015-11-04 ENCOUNTER — Inpatient Hospital Stay: Payer: BLUE CROSS/BLUE SHIELD | Attending: Internal Medicine

## 2015-11-04 ENCOUNTER — Other Ambulatory Visit: Payer: Self-pay | Admitting: Internal Medicine

## 2015-11-04 DIAGNOSIS — Z79899 Other long term (current) drug therapy: Secondary | ICD-10-CM | POA: Diagnosis not present

## 2015-11-04 DIAGNOSIS — Z9884 Bariatric surgery status: Secondary | ICD-10-CM | POA: Insufficient documentation

## 2015-11-04 DIAGNOSIS — D509 Iron deficiency anemia, unspecified: Secondary | ICD-10-CM | POA: Diagnosis not present

## 2015-11-04 DIAGNOSIS — N920 Excessive and frequent menstruation with regular cycle: Secondary | ICD-10-CM | POA: Insufficient documentation

## 2015-11-04 LAB — FERRITIN: Ferritin: 17 ng/mL (ref 11–307)

## 2015-11-04 LAB — CBC WITH DIFFERENTIAL/PLATELET
BASOS ABS: 0 10*3/uL (ref 0–0.1)
BASOS PCT: 1 %
Eosinophils Absolute: 0.1 10*3/uL (ref 0–0.7)
Eosinophils Relative: 3 %
HEMATOCRIT: 35.4 % (ref 35.0–47.0)
Hemoglobin: 12.1 g/dL (ref 12.0–16.0)
LYMPHS PCT: 26 %
Lymphs Abs: 0.8 10*3/uL — ABNORMAL LOW (ref 1.0–3.6)
MCH: 31.5 pg (ref 26.0–34.0)
MCHC: 34.3 g/dL (ref 32.0–36.0)
MCV: 91.9 fL (ref 80.0–100.0)
MONO ABS: 0.3 10*3/uL (ref 0.2–0.9)
Monocytes Relative: 10 %
NEUTROS ABS: 1.8 10*3/uL (ref 1.4–6.5)
NEUTROS PCT: 60 %
Platelets: 211 10*3/uL (ref 150–440)
RBC: 3.86 MIL/uL (ref 3.80–5.20)
RDW: 13.8 % (ref 11.5–14.5)
WBC: 3 10*3/uL — AB (ref 3.6–11.0)

## 2015-11-10 ENCOUNTER — Telehealth: Payer: Self-pay | Admitting: *Deleted

## 2015-11-10 NOTE — Telephone Encounter (Signed)
Patient called to inquire why she needs to come on Friday for md/infusion.  I called pt back and left a vm for patient. This was a scheduled apt for MD and IV iron.  Her iron stores were lower than last labs in October.  Ferritin is 16.  Pt will require IV iron on Friday.

## 2015-11-11 ENCOUNTER — Inpatient Hospital Stay: Payer: BLUE CROSS/BLUE SHIELD | Attending: Internal Medicine | Admitting: Internal Medicine

## 2015-11-11 ENCOUNTER — Other Ambulatory Visit: Payer: Self-pay | Admitting: Internal Medicine

## 2015-11-11 ENCOUNTER — Inpatient Hospital Stay: Payer: BLUE CROSS/BLUE SHIELD

## 2015-11-11 VITALS — BP 101/60 | HR 68 | Resp 18

## 2015-11-11 VITALS — BP 121/59 | HR 57 | Temp 96.7°F | Resp 18 | Wt 149.3 lb

## 2015-11-11 DIAGNOSIS — Z79899 Other long term (current) drug therapy: Secondary | ICD-10-CM | POA: Insufficient documentation

## 2015-11-11 DIAGNOSIS — E669 Obesity, unspecified: Secondary | ICD-10-CM | POA: Insufficient documentation

## 2015-11-11 DIAGNOSIS — Z9884 Bariatric surgery status: Secondary | ICD-10-CM | POA: Diagnosis not present

## 2015-11-11 DIAGNOSIS — D509 Iron deficiency anemia, unspecified: Secondary | ICD-10-CM

## 2015-11-11 DIAGNOSIS — N83209 Unspecified ovarian cyst, unspecified side: Secondary | ICD-10-CM | POA: Insufficient documentation

## 2015-11-11 DIAGNOSIS — G4700 Insomnia, unspecified: Secondary | ICD-10-CM | POA: Insufficient documentation

## 2015-11-11 DIAGNOSIS — E559 Vitamin D deficiency, unspecified: Secondary | ICD-10-CM | POA: Diagnosis not present

## 2015-11-11 MED ORDER — SODIUM CHLORIDE 0.9 % IV SOLN
510.0000 mg | Freq: Once | INTRAVENOUS | Status: AC
  Administered 2015-11-11: 510 mg via INTRAVENOUS
  Filled 2015-11-11: qty 17

## 2015-11-11 MED ORDER — SODIUM CHLORIDE 0.9 % IV SOLN
Freq: Once | INTRAVENOUS | Status: AC
  Administered 2015-11-11: 10:00:00 via INTRAVENOUS
  Filled 2015-11-11: qty 1000

## 2015-11-11 NOTE — Progress Notes (Signed)
[facility] OFFICE PROGRESS NOTE  Patient Care Team: Malva Limes, MD as PCP - General (Family Medicine) Marin Roberts, MD as Referring Physician (Internal Medicine) Earline Mayotte, MD as Consulting Physician (General Surgery)   SUMMARY OF ONCOLOGIC HISTORY:  # IDA [place] Breeding to gastric Bypass 2002/ menstrual blood loss]; IV iron q 6 M/ B12 IM [thru PCP]  INTERVAL HISTORY:  A very pleasant 50 year old female patient with above history of 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 Patient's last IV iron was approximately 1 year ago.  Patient completes of chronic fatigue. Also complains of difficulty sleeping at night.  She continues to complain of heavy menstrual periods especially for the first 2 days. She does not take iron because of poor tolerance.   Denies any blood in stools black stools.  No nausea no vomiting no weight loss.   REVIEW OF SYSTEMS:  A complete 10 point review of system is done which is negative except mentioned above/history of present illness.   PAST MEDICAL HISTORY :  Past Medical History  Diagnosis Date  . Anemia   . Upper GI bleeding 2015    gastric ulcer  . Vitamin B12 deficiency   . Vitamin D deficiency   . Insomnia   . Ovarian cyst   . Obesity     PAST SURGICAL HISTORY :   Past Surgical History  Procedure Laterality Date  . Tubal ligation    . Tonsillectomy    . Cholecystectomy  2009  . Gastric bypass  2006  . Upper gi endoscopy    . Colonoscopy  2014    FAMILY HISTORY :   Family History  Problem Relation Age of Onset  . Breast cancer Maternal Aunt     [id number]  . Colon cancer Maternal Uncle   . Lung cancer Maternal Grandfather   . Cancer Maternal Grandfather   . Diabetes Mother   . Hypertension Mother   . Diabetes Father   . Hypertension Father   . Crohn's disease Son   . Rheum arthritis Son   . Asthma Son   . Cancer Maternal Grandmother     SOCIAL HISTORY:    Social History  Substance Use Topics  . Smoking status: Never Smoker   . Smokeless tobacco: Never Used  . Alcohol Use: No    ALLERGIES:  has No Known Allergies.  MEDICATIONS:  Current Outpatient Prescriptions  Medication Sig Dispense Refill  . amitriptyline (ELAVIL) 50 MG tablet Take 1 tablet (50 mg total) by mouth at bedtime. 90 tablet 1  . cyanocobalamin (,VITAMIN B-12,) 1000 MCG/ML injection Inject into the muscle.    . nystatin-triamcinolone ointment (MYCOLOG) Apply topically.    . Vitamin D, Ergocalciferol, (DRISDOL) 50000 UNITS CAPS capsule TAKE 1 CAPSULE WEEKLY 12 capsule 4  . ferumoxytol (FERAHEME) 510 MG/17ML SOLN injection Inject into the vein.     No current facility-administered medications for this visit.    PHYSICAL EXAMINATION: ECOG PERFORMANCE STATUS: 0 - Asymptomatic  BP 121/59 mmHg  Pulse 57  Temp(Src) 96.7 F (35.9 C) (Tympanic)  Resp 18  Wt 149 lb 4 oz (67.7 kg)  Filed Weights   11/11/15 0926  Weight: 149 lb 4 oz (67.7 kg)    GENERAL: Well-nourished well-developed; Alert, no distress and comfortable.   Alone. EYES: no pallor or icterus OROPHARYNX: no thrush or ulceration; good dentition  NECK: supple, no masses felt LYMPH:  no palpable lymphadenopathy in the cervical, axillary or inguinal regions  LUNGS: clear to auscultation and  No wheeze or crackles HEART/CVS: regular rate & rhythm and no murmurs; No lower extremity edema ABDOMEN:abdomen soft, non-tender and normal bowel sounds Musculoskeletal:no cyanosis of digits and no clubbing  PSYCH: alert & oriented x 3 with fluent speech NEURO: no focal motor/sensory deficits SKIN:  no rashes or significant lesions  LABORATORY DATA:  I have reviewed the data as listed    Component Value Date/Time   NA 141 06/10/2015 0929   NA 142 03/23/2013 0428   K 4.5 06/10/2015 0929   K 4.1 06/29/2013 0902   CL 103 06/10/2015 0929   CL 112* 03/23/2013 0428   CO2 24 06/10/2015 0929   CO2 24 03/23/2013 0428    GLUCOSE 86 06/10/2015 0929   GLUCOSE 90 03/23/2013 0428   BUN 11 06/10/2015 0929   BUN 5* 03/23/2013 0428   CREATININE 0.76 06/10/2015 0929   CREATININE 0.8 10/16/2013   CREATININE 0.75 06/12/2013 0842   CALCIUM 9.1 06/10/2015 0929   CALCIUM 7.7* 03/23/2013 0428   PROT 6.6 06/10/2015 0929   PROT 6.1* 03/20/2013 1843   ALBUMIN 3.9 06/10/2015 0929   ALBUMIN 3.0* 03/20/2013 1843   AST 36 06/10/2015 0929   AST 25 03/20/2013 1843   ALT 28 06/10/2015 0929   ALT 49 03/20/2013 1843   ALKPHOS 78 06/10/2015 0929   ALKPHOS 84 03/20/2013 1843   BILITOT 0.3 06/10/2015 0929   BILITOT 0.1* 03/20/2013 1843   GFRNONAA 94 06/10/2015 0929   GFRNONAA >60 06/12/2013 0842   GFRAA 108 06/10/2015 0929   GFRAA >60 06/12/2013 0842    No results found for: SPEP, UPEP  Lab Results  Component Value Date   WBC 3.0* 11/04/2015   NEUTROABS 1.8 11/04/2015   HGB 12.1 11/04/2015   HCT 35.4 11/04/2015   MCV 91.9 11/04/2015   PLT 211 11/04/2015      Chemistry      Component Value Date/Time   NA 141 06/10/2015 0929   NA 142 03/23/2013 0428   K 4.5 06/10/2015 0929   K 4.1 06/29/2013 0902   CL 103 06/10/2015 0929   CL 112* 03/23/2013 0428   CO2 24 06/10/2015 0929   CO2 24 03/23/2013 0428   BUN 11 06/10/2015 0929   BUN 5* 03/23/2013 0428   CREATININE 0.76 06/10/2015 0929   CREATININE 0.8 10/16/2013   CREATININE 0.75 06/12/2013 0842   GLU 82 10/16/2013      Component Value Date/Time   CALCIUM 9.1 06/10/2015 0929   CALCIUM 7.7* 03/23/2013 0428   ALKPHOS 78 06/10/2015 0929   ALKPHOS 84 03/20/2013 1843   AST 36 06/10/2015 0929   AST 25 03/20/2013 1843   ALT 28 06/10/2015 0929   ALT 49 03/20/2013 1843   BILITOT 0.3 06/10/2015 0929   BILITOT 0.1* 03/20/2013 1843        ASSESSMENT & PLAN:   # 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 Hemoglobin is 12.3 today; ferritin 17. This has trended down from 60 previously. As patient is symptomatic with fatigue/insomnia- I would  recommend IV Feraheme. Patient will need every 6 months IV Feraheme maintenance.  # Menorrhagia- she is recommended endometrial ablation/she's reluctant.  # Recommend follow-up otherwise in 6 months/labs 1 week prior to next visit; IV iron infusion again if needed.     Earna CoderGovinda R Meilin Brosh, MD 11/11/2015 10:00 AM

## 2015-11-18 ENCOUNTER — Ambulatory Visit (INDEPENDENT_AMBULATORY_CARE_PROVIDER_SITE_OTHER): Payer: BLUE CROSS/BLUE SHIELD

## 2015-11-18 ENCOUNTER — Inpatient Hospital Stay: Payer: BLUE CROSS/BLUE SHIELD

## 2015-11-18 DIAGNOSIS — E538 Deficiency of other specified B group vitamins: Secondary | ICD-10-CM | POA: Diagnosis not present

## 2015-11-18 MED ORDER — CYANOCOBALAMIN 1000 MCG/ML IJ SOLN
1000.0000 ug | Freq: Once | INTRAMUSCULAR | Status: AC
  Administered 2015-11-18: 1000 ug via INTRAMUSCULAR

## 2015-12-16 ENCOUNTER — Ambulatory Visit: Payer: Self-pay

## 2015-12-23 ENCOUNTER — Ambulatory Visit (INDEPENDENT_AMBULATORY_CARE_PROVIDER_SITE_OTHER): Payer: BLUE CROSS/BLUE SHIELD

## 2015-12-23 ENCOUNTER — Ambulatory Visit: Payer: BLUE CROSS/BLUE SHIELD | Admitting: Family Medicine

## 2015-12-23 DIAGNOSIS — E538 Deficiency of other specified B group vitamins: Secondary | ICD-10-CM

## 2015-12-23 MED ORDER — CYANOCOBALAMIN 1000 MCG/ML IJ SOLN
1000.0000 ug | Freq: Once | INTRAMUSCULAR | Status: AC
  Administered 2015-12-23: 1000 ug via INTRAMUSCULAR

## 2015-12-27 ENCOUNTER — Other Ambulatory Visit: Payer: Self-pay | Admitting: Obstetrics and Gynecology

## 2015-12-27 ENCOUNTER — Ambulatory Visit
Admission: RE | Admit: 2015-12-27 | Discharge: 2015-12-27 | Disposition: A | Discharge status: Home / Self Care | Payer: BLUE CROSS/BLUE SHIELD | Source: Ambulatory Visit | Attending: Obstetrics and Gynecology | Admitting: Obstetrics and Gynecology

## 2015-12-27 DIAGNOSIS — Z1231 Encounter for screening mammogram for malignant neoplasm of breast: Secondary | ICD-10-CM | POA: Insufficient documentation

## 2015-12-27 DIAGNOSIS — N6002 Solitary cyst of left breast: Secondary | ICD-10-CM | POA: Insufficient documentation

## 2016-01-20 ENCOUNTER — Ambulatory Visit (INDEPENDENT_AMBULATORY_CARE_PROVIDER_SITE_OTHER): Payer: BLUE CROSS/BLUE SHIELD

## 2016-01-20 DIAGNOSIS — E538 Deficiency of other specified B group vitamins: Secondary | ICD-10-CM

## 2016-01-20 MED ORDER — CYANOCOBALAMIN 1000 MCG/ML IJ SOLN
1000.0000 ug | Freq: Once | INTRAMUSCULAR | Status: AC
  Administered 2016-01-20: 1000 ug via INTRAMUSCULAR

## 2016-02-17 ENCOUNTER — Ambulatory Visit: Payer: Self-pay

## 2016-03-16 ENCOUNTER — Ambulatory Visit (INDEPENDENT_AMBULATORY_CARE_PROVIDER_SITE_OTHER): Payer: BLUE CROSS/BLUE SHIELD

## 2016-03-16 DIAGNOSIS — E538 Deficiency of other specified B group vitamins: Secondary | ICD-10-CM

## 2016-03-16 MED ORDER — CYANOCOBALAMIN 1000 MCG/ML IJ SOLN
1000.0000 ug | Freq: Once | INTRAMUSCULAR | Status: AC
  Administered 2016-03-16: 1000 ug via INTRAMUSCULAR

## 2016-04-13 ENCOUNTER — Ambulatory Visit (INDEPENDENT_AMBULATORY_CARE_PROVIDER_SITE_OTHER): Payer: BLUE CROSS/BLUE SHIELD

## 2016-04-13 DIAGNOSIS — E538 Deficiency of other specified B group vitamins: Secondary | ICD-10-CM | POA: Diagnosis not present

## 2016-04-13 MED ORDER — CYANOCOBALAMIN 1000 MCG/ML IJ SOLN
1000.0000 ug | Freq: Once | INTRAMUSCULAR | Status: AC
  Administered 2016-04-13: 1000 ug via INTRAMUSCULAR

## 2016-05-11 ENCOUNTER — Other Ambulatory Visit: Payer: Self-pay

## 2016-05-11 ENCOUNTER — Ambulatory Visit (INDEPENDENT_AMBULATORY_CARE_PROVIDER_SITE_OTHER): Payer: BLUE CROSS/BLUE SHIELD

## 2016-05-11 ENCOUNTER — Inpatient Hospital Stay: Payer: BLUE CROSS/BLUE SHIELD | Attending: Internal Medicine

## 2016-05-11 DIAGNOSIS — Z79899 Other long term (current) drug therapy: Secondary | ICD-10-CM | POA: Insufficient documentation

## 2016-05-11 DIAGNOSIS — E538 Deficiency of other specified B group vitamins: Secondary | ICD-10-CM | POA: Diagnosis not present

## 2016-05-11 DIAGNOSIS — Z9884 Bariatric surgery status: Secondary | ICD-10-CM | POA: Insufficient documentation

## 2016-05-11 DIAGNOSIS — N83209 Unspecified ovarian cyst, unspecified side: Secondary | ICD-10-CM | POA: Diagnosis not present

## 2016-05-11 DIAGNOSIS — E559 Vitamin D deficiency, unspecified: Secondary | ICD-10-CM | POA: Diagnosis not present

## 2016-05-11 DIAGNOSIS — D500 Iron deficiency anemia secondary to blood loss (chronic): Secondary | ICD-10-CM | POA: Insufficient documentation

## 2016-05-11 DIAGNOSIS — D509 Iron deficiency anemia, unspecified: Secondary | ICD-10-CM

## 2016-05-11 DIAGNOSIS — E669 Obesity, unspecified: Secondary | ICD-10-CM | POA: Diagnosis not present

## 2016-05-11 DIAGNOSIS — Z8719 Personal history of other diseases of the digestive system: Secondary | ICD-10-CM | POA: Diagnosis not present

## 2016-05-11 DIAGNOSIS — N920 Excessive and frequent menstruation with regular cycle: Secondary | ICD-10-CM | POA: Diagnosis not present

## 2016-05-11 LAB — COMPREHENSIVE METABOLIC PANEL
ALBUMIN: 3.7 g/dL (ref 3.5–5.0)
ALK PHOS: 56 U/L (ref 38–126)
ALT: 33 U/L (ref 14–54)
AST: 35 U/L (ref 15–41)
Anion gap: 5 (ref 5–15)
BILIRUBIN TOTAL: 0.2 mg/dL — AB (ref 0.3–1.2)
BUN: 13 mg/dL (ref 6–20)
CALCIUM: 8.7 mg/dL — AB (ref 8.9–10.3)
CO2: 27 mmol/L (ref 22–32)
Chloride: 106 mmol/L (ref 101–111)
Creatinine, Ser: 0.7 mg/dL (ref 0.44–1.00)
GFR calc Af Amer: >60 mL/min (ref >60)
GFR calc non Af Amer: >60 mL/min (ref >60)
GLUCOSE: 87 mg/dL (ref 65–99)
Potassium: 3.7 mmol/L (ref 3.5–5.1)
Sodium: 138 mmol/L (ref 135–145)
TOTAL PROTEIN: 6.8 g/dL (ref 6.5–8.1)

## 2016-05-11 LAB — CBC WITH DIFFERENTIAL/PLATELET
BASOS ABS: 0 10*3/uL (ref 0–0.1)
Basophils Relative: 1 %
EOS PCT: 4 %
Eosinophils Absolute: 0.1 10*3/uL (ref 0–0.7)
HCT: 35.2 % (ref 35.0–47.0)
Hemoglobin: 12 g/dL (ref 12.0–16.0)
LYMPHS ABS: 0.8 10*3/uL — AB (ref 1.0–3.6)
LYMPHS PCT: 27 %
MCH: 31.4 pg (ref 26.0–34.0)
MCHC: 34.2 g/dL (ref 32.0–36.0)
MCV: 91.6 fL (ref 80.0–100.0)
MONO ABS: 0.3 10*3/uL (ref 0.2–0.9)
Monocytes Relative: 11 %
Neutro Abs: 1.7 10*3/uL (ref 1.4–6.5)
Neutrophils Relative %: 57 %
PLATELETS: 183 10*3/uL (ref 150–440)
RBC: 3.84 MIL/uL (ref 3.80–5.20)
RDW: 13.3 % (ref 11.5–14.5)
WBC: 3 10*3/uL — ABNORMAL LOW (ref 3.6–11.0)

## 2016-05-11 LAB — IRON AND TIBC
Iron: 69 ug/dL (ref 28–170)
Saturation Ratios: 20 % (ref 10.4–31.8)
TIBC: 338 ug/dL (ref 250–450)
UIBC: 269 ug/dL

## 2016-05-11 LAB — FERRITIN: Ferritin: 60 ng/mL (ref 11–307)

## 2016-05-11 MED ORDER — CYANOCOBALAMIN 1000 MCG/ML IJ SOLN
1000.0000 ug | Freq: Once | INTRAMUSCULAR | Status: AC
  Administered 2016-05-11: 1000 ug via INTRAMUSCULAR

## 2016-05-18 ENCOUNTER — Inpatient Hospital Stay: Payer: BLUE CROSS/BLUE SHIELD

## 2016-05-18 ENCOUNTER — Inpatient Hospital Stay (HOSPITAL_BASED_OUTPATIENT_CLINIC_OR_DEPARTMENT_OTHER): Payer: BLUE CROSS/BLUE SHIELD | Admitting: Internal Medicine

## 2016-05-18 VITALS — BP 96/59 | HR 66 | Temp 97.6°F | Resp 18 | Wt 144.8 lb

## 2016-05-18 DIAGNOSIS — Z9884 Bariatric surgery status: Secondary | ICD-10-CM | POA: Diagnosis not present

## 2016-05-18 DIAGNOSIS — Z79899 Other long term (current) drug therapy: Secondary | ICD-10-CM

## 2016-05-18 DIAGNOSIS — D500 Iron deficiency anemia secondary to blood loss (chronic): Secondary | ICD-10-CM

## 2016-05-18 DIAGNOSIS — N920 Excessive and frequent menstruation with regular cycle: Secondary | ICD-10-CM | POA: Diagnosis not present

## 2016-05-18 DIAGNOSIS — D508 Other iron deficiency anemias: Secondary | ICD-10-CM

## 2016-05-18 MED ORDER — SODIUM CHLORIDE 0.9 % IV SOLN
Freq: Once | INTRAVENOUS | Status: AC
  Administered 2016-05-18: 10:00:00 via INTRAVENOUS
  Filled 2016-05-18: qty 1000

## 2016-05-18 MED ORDER — FERUMOXYTOL INJECTION 510 MG/17 ML
510.0000 mg | Freq: Once | INTRAVENOUS | Status: AC
  Administered 2016-05-18: 510 mg via INTRAVENOUS
  Filled 2016-05-18: qty 17

## 2016-05-18 NOTE — Assessment & Plan Note (Signed)
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 Hemoglobin is 12 today; ferritin -60. I would recommend IV Feraheme. Patient will need every 6 months IV Feraheme maintenance.  # Menorrhagia- she is recommended endometrial ablation/she's reluctant.  # ? raynauds's- defer to PCP.   # Recommend follow-up otherwise in 6 months/labs 1 week prior to next visit; IV iron infusion again if needed.

## 2016-05-18 NOTE — Progress Notes (Signed)
Patient is here for follow up, she is doing ok she eats bananas to help with the cramps.   Took patient blood pressure  Right arm was 94/60 pulse 68  Left arm 96/59 pulse 66 She denies any dizziness

## 2016-05-18 NOTE — Progress Notes (Signed)
[facility] OFFICE PROGRESS NOTE  Patient Care Team: Malva Limesonald E [name], MD as PCP - General (Family Medicine) Marin Robertsobert G Gittin, MD as Referring Physician (Internal Medicine) Earline MayotteJeffrey W Byrnett, MD as Consulting Physician (General Surgery)   SUMMARY OF ONCOLOGIC HISTORY:  # IDA Laura Murray[sec to gastric Bypass 2002/ menstrual blood loss]; IV iron q 6 M/ B12 IM [thru PCP]  INTERVAL HISTORY:  A very pleasant 50 year old female patient with above history of 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 Patient's last IV iron was approximately 6 months ago.   Patient denies any significant fatigue or chest pain or shortness of breath on exertion. However she continues to complain of heavy menstrual periods especially for the first 2 days. She does not take iron because of poor tolerance.   Denies any blood in stools black stools.  No nausea no vomiting no weight loss. Patient complains of numbness in her fingertips- intermittently. No association with cold.   REVIEW OF SYSTEMS:  A complete 10 point review of system is done which is negative except mentioned above/history of present illness.   PAST MEDICAL HISTORY :  Past Medical History:  Diagnosis Date  . Anemia   . Insomnia   . Obesity   . Ovarian cyst   . Upper GI bleeding 2015   gastric ulcer  . Vitamin B12 deficiency   . Vitamin D deficiency     PAST SURGICAL HISTORY :   Past Surgical History:  Procedure Laterality Date  . CHOLECYSTECTOMY  2009  . COLONOSCOPY  2014  . GASTRIC BYPASS  2006  . TONSILLECTOMY    . TUBAL LIGATION    . UPPER GI ENDOSCOPY      FAMILY HISTORY :   Family History  Problem Relation Age of Onset  . Breast cancer Maternal Aunt     [id number],2010  . Colon cancer Maternal Uncle   . Lung cancer Maternal Grandfather   . Cancer Maternal Grandfather   . Diabetes Mother   . Hypertension Mother   . Diabetes Father   . Hypertension Father   . Crohn's disease  Son   . Rheum arthritis Son   . Asthma Son   . Cancer Maternal Grandmother     SOCIAL HISTORY:   Social History  Substance Use Topics  . Smoking status: Never Smoker  . Smokeless tobacco: Never Used  . Alcohol use No    ALLERGIES:  has No Known Allergies.  MEDICATIONS:  Current Outpatient Prescriptions  Medication Sig Dispense Refill  . amitriptyline (ELAVIL) 50 MG tablet Take 1 tablet (50 mg total) by mouth at bedtime. 90 tablet 1  . cyanocobalamin (,VITAMIN B-12,) 1000 MCG/ML injection Inject into the muscle.    . ferumoxytol (FERAHEME) 510 MG/17ML SOLN injection Inject into the vein.    . Vitamin D, Ergocalciferol, (DRISDOL) 50000 UNITS CAPS capsule TAKE 1 CAPSULE WEEKLY 12 capsule 4   No current facility-administered medications for this visit.     PHYSICAL EXAMINATION: ECOG PERFORMANCE STATUS: 0 - Asymptomatic  BP (!) 96/59 (BP Location: Right Arm, Patient Position: Sitting)   Pulse 66   Temp 97.6 F (36.4 C) (Tympanic)   Resp 18   Wt 144 lb 12.8 oz (65.7 kg)   BMI 24.85 kg/m   Filed Weights   05/18/16 0907  Weight: 144 lb 12.8 oz (65.7 kg)    GENERAL: Well-nourished well-developed; Alert, no distress and comfortable.   Alone. EYES: no pallor or icterus OROPHARYNX: no thrush  or ulceration; good dentition  NECK: supple, no masses felt LYMPH:  no palpable lymphadenopathy in the cervical, axillary or inguinal regions LUNGS: clear to auscultation and  No wheeze or crackles HEART/CVS: regular rate & rhythm and no murmurs; No lower extremity edema ABDOMEN:abdomen soft, non-tender and normal bowel sounds Musculoskeletal:no cyanosis of digits and no clubbing  PSYCH: alert & oriented x 3 with fluent speech NEURO: no focal motor/sensory deficits SKIN:  no rashes or significant lesions  LABORATORY DATA:  I have reviewed the data as listed    Component Value Date/Time   NA 138 05/11/2016 1501   NA 141 06/10/2015 0929   NA 142 03/23/2013 0428   K 3.7 05/11/2016  1501   K 4.1 06/29/2013 0902   CL 106 05/11/2016 1501   CL 112 (H) 03/23/2013 0428   CO2 27 05/11/2016 1501   CO2 24 03/23/2013 0428   GLUCOSE 87 05/11/2016 1501   GLUCOSE 90 03/23/2013 0428   BUN 13 05/11/2016 1501   BUN 11 06/10/2015 0929   BUN 5 (L) 03/23/2013 0428   CREATININE 0.70 05/11/2016 1501   CREATININE 0.75 06/12/2013 0842   CALCIUM 8.7 (L) 05/11/2016 1501   CALCIUM 7.7 (L) 03/23/2013 0428   PROT 6.8 05/11/2016 1501   PROT 6.6 06/10/2015 0929   PROT 6.1 (L) 03/20/2013 1843   ALBUMIN 3.7 05/11/2016 1501   ALBUMIN 3.9 06/10/2015 0929   ALBUMIN 3.0 (L) 03/20/2013 1843   AST 35 05/11/2016 1501   AST 25 03/20/2013 1843   ALT 33 05/11/2016 1501   ALT 49 03/20/2013 1843   ALKPHOS 56 05/11/2016 1501   ALKPHOS 84 03/20/2013 1843   BILITOT 0.2 (L) 05/11/2016 1501   BILITOT 0.3 06/10/2015 0929   BILITOT 0.1 (L) 03/20/2013 1843   GFRNONAA >60 05/11/2016 1501   GFRNONAA >60 06/12/2013 0842   GFRAA >60 05/11/2016 1501   GFRAA >60 06/12/2013 0842    No results found for: SPEP, UPEP  Lab Results  Component Value Date   WBC 3.0 (L) 05/11/2016   NEUTROABS 1.7 05/11/2016   HGB 12.0 05/11/2016   HCT 35.2 05/11/2016   MCV 91.6 05/11/2016   PLT 183 05/11/2016      Chemistry      Component Value Date/Time   NA 138 05/11/2016 1501   NA 141 06/10/2015 0929   NA 142 03/23/2013 0428   K 3.7 05/11/2016 1501   K 4.1 06/29/2013 0902   CL 106 05/11/2016 1501   CL 112 (H) 03/23/2013 0428   CO2 27 05/11/2016 1501   CO2 24 03/23/2013 0428   BUN 13 05/11/2016 1501   BUN 11 06/10/2015 0929   BUN 5 (L) 03/23/2013 0428   CREATININE 0.70 05/11/2016 1501   CREATININE 0.75 06/12/2013 0842   GLU 82 10/16/2013      Component Value Date/Time   CALCIUM 8.7 (L) 05/11/2016 1501   CALCIUM 7.7 (L) 03/23/2013 0428   ALKPHOS 56 05/11/2016 1501   ALKPHOS 84 03/20/2013 1843   AST 35 05/11/2016 1501   AST 25 03/20/2013 1843   ALT 33 05/11/2016 1501   ALT 49 03/20/2013 1843   BILITOT  0.2 (L) 05/11/2016 1501   BILITOT 0.3 06/10/2015 0929   BILITOT 0.1 (L) 03/20/2013 1843        ASSESSMENT & PLAN:  Iron deficiency anemia due to chronic blood loss  # 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 Hemoglobin is 12 today; ferritin -60. I would recommend IV Feraheme. Patient will  need every 6 months IV Feraheme maintenance.  # Menorrhagia- she is recommended endometrial ablation/she's reluctant.  # ? raynauds's- defer to PCP.   # Recommend follow-up otherwise in 6 months/labs 1 week prior to next visit; IV iron infusion again if needed.     Earna Coder, MD 05/18/2016 9:31 AM

## 2016-06-08 ENCOUNTER — Ambulatory Visit (INDEPENDENT_AMBULATORY_CARE_PROVIDER_SITE_OTHER): Payer: BLUE CROSS/BLUE SHIELD

## 2016-06-08 DIAGNOSIS — E538 Deficiency of other specified B group vitamins: Secondary | ICD-10-CM | POA: Diagnosis not present

## 2016-06-08 MED ORDER — CYANOCOBALAMIN 1000 MCG/ML IJ SOLN
1000.0000 ug | Freq: Once | INTRAMUSCULAR | Status: AC
  Administered 2016-06-08: 1000 ug via INTRAMUSCULAR

## 2016-07-06 ENCOUNTER — Ambulatory Visit (INDEPENDENT_AMBULATORY_CARE_PROVIDER_SITE_OTHER): Payer: BLUE CROSS/BLUE SHIELD

## 2016-07-06 DIAGNOSIS — E538 Deficiency of other specified B group vitamins: Secondary | ICD-10-CM

## 2016-07-06 MED ORDER — CYANOCOBALAMIN 1000 MCG/ML IJ SOLN
1000.0000 ug | Freq: Once | INTRAMUSCULAR | Status: AC
  Administered 2016-07-06: 1000 ug via INTRAMUSCULAR

## 2016-08-03 ENCOUNTER — Ambulatory Visit (INDEPENDENT_AMBULATORY_CARE_PROVIDER_SITE_OTHER): Payer: BLUE CROSS/BLUE SHIELD | Admitting: Family Medicine

## 2016-08-03 ENCOUNTER — Encounter: Payer: Self-pay | Admitting: Family Medicine

## 2016-08-03 VITALS — BP 94/60 | HR 65 | Temp 98.2°F | Resp 16 | Ht 64.0 in | Wt 146.0 lb

## 2016-08-03 DIAGNOSIS — E538 Deficiency of other specified B group vitamins: Secondary | ICD-10-CM

## 2016-08-03 DIAGNOSIS — E559 Vitamin D deficiency, unspecified: Secondary | ICD-10-CM

## 2016-08-03 MED ORDER — CYANOCOBALAMIN 1000 MCG/ML IJ SOLN
1000.0000 ug | Freq: Once | INTRAMUSCULAR | Status: AC
  Administered 2016-08-03: 1000 ug via INTRAMUSCULAR

## 2016-08-03 NOTE — Progress Notes (Signed)
       Patient: Laura RiddleJacqueline C Yardley Female    DOB: July 24, 1965   51 y.o.   MRN: [id number] Visit Date: 08/03/2016  Today's Provider: Mila Merryonald Shubham Thackston, MD   Chief Complaint  Patient presents with  . Follow-up   Subjective:    HPI  Insomnia From 06/10/2015- increased amitriptyline 2 tablets at bedtime. Stats she takes these just a few times a month, they are well tolerated and remain effective with no adverse reactions.   B12 deficiency From 06/10/2015-labs checked, no changes. Is getting monthly B12 injections. Energy levels are good. Is being followed by cancer center for iron deficiency and has had a couple iron infusions over the last year.   Avitaminosis D From 06/10/2015-labs checked, no changes. Continues on weekly vitamin d and is tolerating well.  Lab Results  Component Value Date   VD25OH 29.8 (L) 06/10/2015     No Known Allergies   Current Outpatient Prescriptions:  .  amitriptyline (ELAVIL) 50 MG tablet, Take 1 tablet (50 mg total) by mouth at bedtime., Disp: 90 tablet, Rfl: 1 .  cyanocobalamin (,VITAMIN B-12,) 1000 MCG/ML injection, Inject into the muscle., Disp: , Rfl:  .  ferumoxytol (FERAHEME) 510 MG/17ML SOLN injection, Inject into the vein., Disp: , Rfl:  .  Vitamin D, Ergocalciferol, (DRISDOL) 50000 UNITS CAPS capsule, TAKE 1 CAPSULE WEEKLY, Disp: 12 capsule, Rfl: 4  Review of Systems  Constitutional: Negative for appetite change, chills, fatigue and fever.  Respiratory: Negative for chest tightness and shortness of breath.   Cardiovascular: Negative for chest pain and palpitations.  Gastrointestinal: Negative for abdominal pain, nausea and vomiting.  Neurological: Negative for dizziness and weakness.    Social History  Substance Use Topics  . Smoking status: Never Smoker  . Smokeless tobacco: Never Used  . Alcohol use No   Objective:   BP 94/60 (BP Location: Right Arm, Patient Position: Sitting, Cuff Size: Normal)   Pulse 65   Temp 98.2 F (36.8 C)  (Oral)   Resp 16   Ht 5\' 4"  (1.626 m)   Wt 146 lb (66.2 kg)   SpO2 99%   BMI 25.06 kg/m   Physical Exam   General Appearance:    Alert, cooperative, no distress  Eyes:    PERRL, conjunctiva/corneas clear, EOM's intact       Lungs:     Clear to auscultation bilaterally, respirations unlabored  Heart:    Regular rate and rhythm  Neurologic:   Awake, alert, oriented x 3. No apparent focal neurological           defect.           Assessment & Plan:      1. B12 deficiency Doing well on monthly injectins.  - Vitamin B12 - cyanocobalamin ((VITAMIN B-12)) injection 1,000 mcg; Inject 1 mL (1,000 mcg total) into the muscle once.  2. Vitamin D deficiency Doing well on weekly oral vitamin d supplements.  - VITAMIN D 25 Hydroxy (Vit-D Deficiency, Fractures)  Had flu shot at work       Mila Merryonald Oveta Idris, MD  [facility]

## 2016-08-04 LAB — VITAMIN D 25 HYDROXY (VIT D DEFICIENCY, FRACTURES): Vit D, 25-Hydroxy: 17.5 ng/mL — ABNORMAL LOW (ref 30.0–100.0)

## 2016-08-04 LAB — VITAMIN B12: Vitamin B-12: >2000 pg/mL — ABNORMAL HIGH (ref 232–1245)

## 2016-08-06 ENCOUNTER — Telehealth: Payer: Self-pay

## 2016-08-06 NOTE — Telephone Encounter (Signed)
Tried calling pt. VM was full and could not leave a message. Will try again later. Allene DillonEmily Drozdowski, CMA

## 2016-08-06 NOTE — Telephone Encounter (Signed)
-----   Message from Malva Limesonald E [name], MD sent at 08/04/2016  9:14 PM EST ----- Vitamin d levels are very low. Recommend she change to OTC vitamin D3 5,000 units every day. Check D levels in 3 months.

## 2016-08-08 NOTE — Telephone Encounter (Signed)
Tried calling pt. VM was full and could not leave a message.

## 2016-08-08 NOTE — Telephone Encounter (Signed)
Patient advised. 3 month follow up scheduled.  

## 2016-08-29 ENCOUNTER — Other Ambulatory Visit: Payer: Self-pay | Admitting: Family Medicine

## 2016-08-31 ENCOUNTER — Ambulatory Visit (INDEPENDENT_AMBULATORY_CARE_PROVIDER_SITE_OTHER): Payer: BLUE CROSS/BLUE SHIELD

## 2016-08-31 DIAGNOSIS — E538 Deficiency of other specified B group vitamins: Secondary | ICD-10-CM | POA: Diagnosis not present

## 2016-08-31 MED ORDER — CYANOCOBALAMIN 1000 MCG/ML IJ SOLN
1000.0000 ug | Freq: Once | INTRAMUSCULAR | Status: AC
  Administered 2016-08-31: 1000 ug via INTRAMUSCULAR

## 2016-09-14 ENCOUNTER — Encounter: Payer: Self-pay | Admitting: Family Medicine

## 2016-09-14 ENCOUNTER — Ambulatory Visit (INDEPENDENT_AMBULATORY_CARE_PROVIDER_SITE_OTHER): Payer: BLUE CROSS/BLUE SHIELD | Admitting: Family Medicine

## 2016-09-14 VITALS — BP 110/60 | HR 68 | Temp 99.1°F | Resp 16 | Wt 152.0 lb

## 2016-09-14 DIAGNOSIS — M6208 Separation of muscle (nontraumatic), other site: Secondary | ICD-10-CM | POA: Diagnosis not present

## 2016-09-14 NOTE — Progress Notes (Signed)
       Patient: Laura Murray Female    DOB: 1966/01/15   51 y.o.   MRN: [id number] Visit Date: 09/14/2016  Today's Provider: Mila Merryonald Nicolle Heward, MD   Chief Complaint  Patient presents with  . Hernia   Subjective:    HPI Hernia:  Patient presents today for further evaluation of an umbilical hernia. She [facility]. Patient reports the she has noticed bulging in midline of abdomen getting larger over the last year.     No Known Allergies   Current Outpatient Prescriptions:  .  amitriptyline (ELAVIL) 50 MG tablet, Take 1 tablet (50 mg total) by mouth at bedtime., Disp: 90 tablet, Rfl: 1 .  cyanocobalamin (,VITAMIN B-12,) 1000 MCG/ML injection, Inject into the muscle., Disp: , Rfl:  .  ferumoxytol (FERAHEME) 510 MG/17ML SOLN injection, Inject into the vein., Disp: , Rfl:  .  Vitamin D, Ergocalciferol, (DRISDOL) 50000 units CAPS capsule, TAKE 1 CAPSULE WEEKLY, Disp: 12 capsule, Rfl: 4  Review of Systems  Constitutional: Negative for appetite change, chills, fatigue and fever.  Respiratory: Negative for chest tightness and shortness of breath.   Cardiovascular: Negative for chest pain and palpitations.  Gastrointestinal: Negative for abdominal pain, nausea and vomiting.  Skin:       Hernia  Neurological: Negative for dizziness and weakness.    Social History  Substance Use Topics  . Smoking status: Never Smoker  . Smokeless tobacco: Never Used  . Alcohol use No   Objective:   BP 110/60 (BP Location: Left Arm, Patient Position: Sitting, Cuff Size: Normal)   Pulse 68   Temp 99.1 F (37.3 C) (Oral)   Resp 16   Wt 152 lb (68.9 kg)   SpO2 99% Comment: room air  BMI 26.09 kg/m  Vitals:   09/14/16 0812  BP: 110/60  Pulse: 68  Resp: 16  Temp: 99.1 F (37.3 C)  TempSrc: Oral  SpO2: 99%  Weight: 152 lb (68.9 kg)     Physical Exam   General Appearance:    Alert, cooperative, no distress  Eyes:    PERRL,  conjunctiva/corneas clear, EOM's intact       Lungs:     Clear to auscultation bilaterally, respirations unlabored  Heart:    Regular rate and rhythm  Neurologic:   Awake, alert, oriented x 3. No apparent focal neurological           defect.   Abd:   Diastasis recti noted which patient indicates is the bulging that she has noticed. Tiny umbilical hernia on exam.        Assessment & Plan:     1. Diastasis recti Is having no pain or discomfort. Reassured of benign nature of condition and that surgical intervention is not indicated at this point. Offered surgery referral for their opinion, but she prefers not to have surgery if it is not necessary. Encouraged continue abdominal exercises for tone.        Laura Merryonald Kimarie Coor, MD  [facility]

## 2016-09-28 ENCOUNTER — Ambulatory Visit: Payer: Self-pay

## 2016-11-02 ENCOUNTER — Ambulatory Visit: Payer: Self-pay | Admitting: Family Medicine

## 2016-11-08 ENCOUNTER — Other Ambulatory Visit: Payer: BLUE CROSS/BLUE SHIELD

## 2016-11-09 ENCOUNTER — Inpatient Hospital Stay: Payer: BLUE CROSS/BLUE SHIELD | Attending: Internal Medicine

## 2016-11-09 ENCOUNTER — Other Ambulatory Visit: Payer: Self-pay

## 2016-11-09 DIAGNOSIS — K921 Melena: Secondary | ICD-10-CM | POA: Insufficient documentation

## 2016-11-09 DIAGNOSIS — D500 Iron deficiency anemia secondary to blood loss (chronic): Secondary | ICD-10-CM | POA: Insufficient documentation

## 2016-11-09 DIAGNOSIS — N920 Excessive and frequent menstruation with regular cycle: Secondary | ICD-10-CM | POA: Insufficient documentation

## 2016-11-09 DIAGNOSIS — Z8711 Personal history of peptic ulcer disease: Secondary | ICD-10-CM | POA: Diagnosis not present

## 2016-11-09 DIAGNOSIS — N83209 Unspecified ovarian cyst, unspecified side: Secondary | ICD-10-CM | POA: Diagnosis not present

## 2016-11-09 DIAGNOSIS — G4700 Insomnia, unspecified: Secondary | ICD-10-CM | POA: Insufficient documentation

## 2016-11-09 DIAGNOSIS — D72819 Decreased white blood cell count, unspecified: Secondary | ICD-10-CM | POA: Insufficient documentation

## 2016-11-09 DIAGNOSIS — E559 Vitamin D deficiency, unspecified: Secondary | ICD-10-CM | POA: Diagnosis not present

## 2016-11-09 DIAGNOSIS — Z9884 Bariatric surgery status: Secondary | ICD-10-CM | POA: Insufficient documentation

## 2016-11-09 DIAGNOSIS — E669 Obesity, unspecified: Secondary | ICD-10-CM | POA: Diagnosis not present

## 2016-11-09 DIAGNOSIS — Z8719 Personal history of other diseases of the digestive system: Secondary | ICD-10-CM | POA: Diagnosis not present

## 2016-11-09 LAB — CBC WITH DIFFERENTIAL/PLATELET
BASOS PCT: 1 %
Basophils Absolute: 0 10*3/uL (ref 0–0.1)
EOS ABS: 0.1 10*3/uL (ref 0–0.7)
Eosinophils Relative: 3 %
HEMATOCRIT: 34.3 % — AB (ref 35.0–47.0)
HEMOGLOBIN: 11.8 g/dL — AB (ref 12.0–16.0)
Lymphocytes Relative: 30 %
Lymphs Abs: 0.8 10*3/uL — ABNORMAL LOW (ref 1.0–3.6)
MCH: 31.8 pg (ref 26.0–34.0)
MCHC: 34.4 g/dL (ref 32.0–36.0)
MCV: 92.5 fL (ref 80.0–100.0)
MONO ABS: 0.3 10*3/uL (ref 0.2–0.9)
Monocytes Relative: 10 %
NEUTROS ABS: 1.6 10*3/uL (ref 1.4–6.5)
NEUTROS PCT: 56 %
Platelets: 203 10*3/uL (ref 150–440)
RBC: 3.71 MIL/uL — ABNORMAL LOW (ref 3.80–5.20)
RDW: 12.8 % (ref 11.5–14.5)
WBC: 2.8 10*3/uL — ABNORMAL LOW (ref 3.6–11.0)

## 2016-11-09 LAB — COMPREHENSIVE METABOLIC PANEL
ALK PHOS: 72 U/L (ref 38–126)
ALT: 44 U/L (ref 14–54)
ANION GAP: 4 — AB (ref 5–15)
AST: 33 U/L (ref 15–41)
Albumin: 3.6 g/dL (ref 3.5–5.0)
BILIRUBIN TOTAL: 0.6 mg/dL (ref 0.3–1.2)
BUN: 15 mg/dL (ref 6–20)
CALCIUM: 8.7 mg/dL — AB (ref 8.9–10.3)
CO2: 25 mmol/L (ref 22–32)
Chloride: 107 mmol/L (ref 101–111)
Creatinine, Ser: 0.66 mg/dL (ref 0.44–1.00)
GLUCOSE: 93 mg/dL (ref 65–99)
POTASSIUM: 4.3 mmol/L (ref 3.5–5.1)
Sodium: 136 mmol/L (ref 135–145)
TOTAL PROTEIN: 7 g/dL (ref 6.5–8.1)

## 2016-11-09 LAB — IRON AND TIBC
IRON: 122 ug/dL (ref 28–170)
SATURATION RATIOS: 34 % — AB (ref 10.4–31.8)
TIBC: 362 ug/dL (ref 250–450)
UIBC: 240 ug/dL

## 2016-11-09 LAB — FERRITIN: FERRITIN: 30 ng/mL (ref 11–307)

## 2016-11-15 ENCOUNTER — Ambulatory Visit: Payer: BLUE CROSS/BLUE SHIELD

## 2016-11-15 ENCOUNTER — Ambulatory Visit: Payer: BLUE CROSS/BLUE SHIELD | Admitting: Internal Medicine

## 2016-11-16 ENCOUNTER — Inpatient Hospital Stay: Payer: BLUE CROSS/BLUE SHIELD

## 2016-11-16 ENCOUNTER — Inpatient Hospital Stay (HOSPITAL_BASED_OUTPATIENT_CLINIC_OR_DEPARTMENT_OTHER): Payer: BLUE CROSS/BLUE SHIELD | Admitting: Internal Medicine

## 2016-11-16 VITALS — BP 125/68 | HR 60 | Temp 97.8°F | Resp 18 | Ht 64.0 in | Wt 155.0 lb

## 2016-11-16 DIAGNOSIS — D72819 Decreased white blood cell count, unspecified: Secondary | ICD-10-CM

## 2016-11-16 DIAGNOSIS — D500 Iron deficiency anemia secondary to blood loss (chronic): Secondary | ICD-10-CM | POA: Diagnosis not present

## 2016-11-16 DIAGNOSIS — K921 Melena: Secondary | ICD-10-CM | POA: Diagnosis not present

## 2016-11-16 DIAGNOSIS — N920 Excessive and frequent menstruation with regular cycle: Secondary | ICD-10-CM

## 2016-11-16 DIAGNOSIS — Z9884 Bariatric surgery status: Secondary | ICD-10-CM | POA: Diagnosis not present

## 2016-11-16 NOTE — Progress Notes (Signed)
Patient reports bright red rectal bleeding that occurred 3 weeks ago which lasted 5 days. Pt hestitant to proceed with IV iron infusion due to financial costs. She has not been back to be evaluated by GI since last colonoscopy procedure 3 years ago.

## 2016-11-16 NOTE — Assessment & Plan Note (Addendum)
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 Hemoglobin is 11.8 today; ferritin -30; sat- 34%. I would NOT recommend IV Feraheme [reluctant sec to costs]. Recommend by mouth iron.  # Bright red blood in stools- question hemorrhoidal.; EGD colonoscopy 2015- if continues to be issue recommend follow up with PCP/GI. Pt agrees.   # Mild leukopenia white count 2.8 ANC 1.7 however lymphocyte count 0.8 chronic over 1 year at least- discussed with the patient. Monitor for now. If worse than recommend further workup.  # follow up in 6 months/ labs- few days prior/ferrahem infusion prior.

## 2016-11-16 NOTE — Progress Notes (Signed)
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   Denies any shortness of breath or chest pain. Denies any tingling or numbness. No nausea no vomiting. Patient has had an episode of blood in stools and bright red in the last few weeks. Currently resolved.  REVIEW OF SYSTEMS:  A complete 10 point review of system is done which is negative except mentioned above/history of present illness.   PAST MEDICAL HISTORY :  Past Medical History:  Diagnosis Date  . Anemia   . Insomnia   . Obesity   . Ovarian cyst   . Upper GI bleeding 2015   gastric ulcer  . Vitamin B12 deficiency   . Vitamin D deficiency     PAST SURGICAL HISTORY :   Past Surgical History:  Procedure Laterality Date  . CHOLECYSTECTOMY  2009  . COLONOSCOPY  2014  . GASTRIC BYPASS  2006  . TONSILLECTOMY    . TUBAL LIGATION    . UPPER GI ENDOSCOPY      FAMILY HISTORY :   Family History  Problem Relation Age of Onset  . Lung cancer Maternal Grandfather   . Cancer Maternal Grandfather   . Diabetes Mother   . Hypertension Mother   . Diabetes Father   . Hypertension Father   . Crohn's disease Son   . Rheum arthritis Son   . Asthma Son   . Cancer Maternal Grandmother   . Breast cancer Maternal Aunt        [id number]  . Colon cancer Maternal Uncle     SOCIAL HISTORY:   Social History  Substance Use Topics  . Smoking status: Never Smoker  . Smokeless tobacco: Never Used  . Alcohol use No    ALLERGIES:  has No  Known Allergies.  MEDICATIONS:  Current Outpatient Prescriptions  Medication Sig Dispense Refill  . amitriptyline (ELAVIL) 50 MG tablet Take 1 tablet (50 mg total) by mouth at bedtime. 90 tablet 1  . cyanocobalamin (,VITAMIN B-12,) 1000 MCG/ML injection Inject into the muscle.    . Vitamin D, Ergocalciferol, (DRISDOL) 50000 units CAPS capsule TAKE 1 CAPSULE WEEKLY 12 capsule 4  . ferumoxytol (FERAHEME) 510 MG/17ML SOLN injection Inject into the vein.     No current facility-administered medications for this visit.     PHYSICAL EXAMINATION: ECOG PERFORMANCE STATUS: 0 - Asymptomatic  BP 125/68 (BP Location: Left Arm, Patient Position: Sitting)   Pulse 60   Temp 97.8 F (36.6 C) (Tympanic)   Resp 18   Ht 5\' 4"  (1.626 m)   Wt 155 lb (70.3 kg)   BMI 26.61 kg/m   Filed Weights   11/16/16 1033  Weight: 155 lb (70.3 kg)    GENERAL: Well-nourished well-developed; Alert, no distress and comfortable.   Alone. EYES: no pallor or icterus OROPHARYNX: no thrush or ulceration; good dentition  NECK: supple, no masses felt LYMPH:  no palpable lymphadenopathy in the cervical, axillary or inguinal regions LUNGS: clear to auscultation and  No wheeze or crackles HEART/CVS: regular rate &  rhythm and no murmurs; No lower extremity edema ABDOMEN:abdomen soft, non-tender and normal bowel sounds Musculoskeletal:no cyanosis of digits and no clubbing  PSYCH: alert & oriented x 3 with fluent speech NEURO: no focal motor/sensory deficits SKIN:  no rashes or significant lesions  LABORATORY DATA:  I have reviewed the data as listed    Component Value Date/Time   NA 136 11/09/2016 1015   NA 141 06/10/2015 0929   NA 142 03/23/2013 0428   K 4.3 11/09/2016 1015   K 4.1 06/29/2013 0902   CL 107 11/09/2016 1015   CL 112 (H) 03/23/2013 0428   CO2 25 11/09/2016 1015   CO2 24 03/23/2013 0428   GLUCOSE 93 11/09/2016 1015   GLUCOSE 90 03/23/2013 0428   BUN 15 11/09/2016 1015   BUN 11 06/10/2015 0929    BUN 5 (L) 03/23/2013 0428   CREATININE 0.66 11/09/2016 1015   CREATININE 0.75 06/12/2013 0842   CALCIUM 8.7 (L) 11/09/2016 1015   CALCIUM 7.7 (L) 03/23/2013 0428   PROT 7.0 11/09/2016 1015   PROT 6.6 06/10/2015 0929   PROT 6.1 (L) 03/20/2013 1843   ALBUMIN 3.6 11/09/2016 1015   ALBUMIN 3.9 06/10/2015 0929   ALBUMIN 3.0 (L) 03/20/2013 1843   AST 33 11/09/2016 1015   AST 25 03/20/2013 1843   ALT 44 11/09/2016 1015   ALT 49 03/20/2013 1843   ALKPHOS 72 11/09/2016 1015   ALKPHOS 84 03/20/2013 1843   BILITOT 0.6 11/09/2016 1015   BILITOT 0.3 06/10/2015 0929   BILITOT 0.1 (L) 03/20/2013 1843   GFRNONAA >60 11/09/2016 1015   GFRNONAA >60 06/12/2013 0842   GFRAA >60 11/09/2016 1015   GFRAA >60 06/12/2013 0842    No results found for: SPEP, UPEP  Lab Results  Component Value Date   WBC 2.8 (L) 11/09/2016   NEUTROABS 1.6 11/09/2016   HGB 11.8 (L) 11/09/2016   HCT 34.3 (L) 11/09/2016   MCV 92.5 11/09/2016   PLT 203 11/09/2016      Chemistry      Component Value Date/Time   NA 136 11/09/2016 1015   NA 141 06/10/2015 0929   NA 142 03/23/2013 0428   K 4.3 11/09/2016 1015   K 4.1 06/29/2013 0902   CL 107 11/09/2016 1015   CL 112 (H) 03/23/2013 0428   CO2 25 11/09/2016 1015   CO2 24 03/23/2013 0428   BUN 15 11/09/2016 1015   BUN 11 06/10/2015 0929   BUN 5 (L) 03/23/2013 0428   CREATININE 0.66 11/09/2016 1015   CREATININE 0.75 06/12/2013 0842   GLU 82 10/16/2013      Component Value Date/Time   CALCIUM 8.7 (L) 11/09/2016 1015   CALCIUM 7.7 (L) 03/23/2013 0428   ALKPHOS 72 11/09/2016 1015   ALKPHOS 84 03/20/2013 1843   AST 33 11/09/2016 1015   AST 25 03/20/2013 1843   ALT 44 11/09/2016 1015   ALT 49 03/20/2013 1843   BILITOT 0.6 11/09/2016 1015   BILITOT 0.3 06/10/2015 0929   BILITOT 0.1 (L) 03/20/2013 1843        ASSESSMENT & PLAN:  Iron deficiency anemia due to chronic blood loss # 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 Hemoglobin is 11.8  today; ferritin -30; sat- 34%. I would NOT recommend IV Feraheme [reluctant sec to costs]. Recommend by mouth iron.  # Bright red blood in stools- question hemorrhoidal.; EGD colonoscopy 2015- if continues to be issue recommend follow up with PCP/GI. Pt agrees.   # Mild  leukopenia white count 2.8 ANC 1.7 however lymphocyte count 0.8 chronic over 1 year at least- discussed with the patient. Monitor for now. If worse than recommend further workup.  # follow up in 6 months/ labs- few days prior/ferrahem infusion prior.     Earna CoderGovinda R Chez Bulnes, MD 11/20/2016 5:20 PM

## 2017-01-08 ENCOUNTER — Telehealth: Payer: Self-pay | Admitting: Emergency Medicine

## 2017-01-08 NOTE — Telephone Encounter (Signed)
Error

## 2017-01-10 ENCOUNTER — Ambulatory Visit (INDEPENDENT_AMBULATORY_CARE_PROVIDER_SITE_OTHER): Payer: BLUE CROSS/BLUE SHIELD

## 2017-01-10 DIAGNOSIS — E538 Deficiency of other specified B group vitamins: Secondary | ICD-10-CM | POA: Diagnosis not present

## 2017-01-10 MED ORDER — CYANOCOBALAMIN 1000 MCG/ML IJ SOLN
1000.0000 ug | Freq: Once | INTRAMUSCULAR | Status: AC
  Administered 2017-01-10: 1000 ug via INTRAMUSCULAR

## 2017-01-11 ENCOUNTER — Ambulatory Visit: Payer: Self-pay

## 2017-01-11 NOTE — Progress Notes (Signed)
Unable to close chart due to wrong type of appt scheduled.

## 2017-01-18 ENCOUNTER — Other Ambulatory Visit: Payer: Self-pay | Admitting: Obstetrics and Gynecology

## 2017-01-18 DIAGNOSIS — Z1231 Encounter for screening mammogram for malignant neoplasm of breast: Secondary | ICD-10-CM

## 2017-01-18 LAB — RESULTS CONSOLE HPV: CHL HPV: NEGATIVE

## 2017-01-18 LAB — HM PAP SMEAR: HM Pap smear: NEGATIVE

## 2017-01-29 ENCOUNTER — Encounter: Payer: Self-pay | Admitting: General Surgery

## 2017-01-29 ENCOUNTER — Encounter: Payer: Self-pay | Admitting: *Deleted

## 2017-02-08 ENCOUNTER — Ambulatory Visit
Admission: RE | Admit: 2017-02-08 | Discharge: 2017-02-08 | Disposition: A | Discharge status: Home / Self Care | Payer: BLUE CROSS/BLUE SHIELD | Source: Ambulatory Visit | Attending: Obstetrics and Gynecology | Admitting: Obstetrics and Gynecology

## 2017-02-08 ENCOUNTER — Ambulatory Visit (INDEPENDENT_AMBULATORY_CARE_PROVIDER_SITE_OTHER): Payer: BLUE CROSS/BLUE SHIELD | Admitting: *Deleted

## 2017-02-08 DIAGNOSIS — Z1231 Encounter for screening mammogram for malignant neoplasm of breast: Secondary | ICD-10-CM | POA: Insufficient documentation

## 2017-02-08 DIAGNOSIS — E538 Deficiency of other specified B group vitamins: Secondary | ICD-10-CM

## 2017-02-08 MED ORDER — CYANOCOBALAMIN 1000 MCG/ML IJ SOLN
1000.0000 ug | Freq: Once | INTRAMUSCULAR | Status: AC
  Administered 2017-02-08: 1000 ug via INTRAMUSCULAR

## 2017-02-08 NOTE — Patient Instructions (Signed)
Return in 1 month for B12 injection

## 2017-02-20 ENCOUNTER — Ambulatory Visit: Payer: Self-pay | Admitting: General Surgery

## 2017-03-13 ENCOUNTER — Encounter: Payer: Self-pay | Admitting: General Surgery

## 2017-03-13 ENCOUNTER — Encounter: Payer: Self-pay | Admitting: *Deleted

## 2017-03-13 ENCOUNTER — Ambulatory Visit (INDEPENDENT_AMBULATORY_CARE_PROVIDER_SITE_OTHER): Payer: BLUE CROSS/BLUE SHIELD | Admitting: General Surgery

## 2017-03-13 VITALS — BP 98/58 | HR 62 | Resp 12 | Ht 64.0 in | Wt 148.0 lb

## 2017-03-13 DIAGNOSIS — M6208 Separation of muscle (nontraumatic), other site: Secondary | ICD-10-CM

## 2017-03-13 NOTE — Patient Instructions (Signed)
Diastasis Recti Diastasis recti is when the muscles of the abdomen (rectus abdominis muscles) become thin and separate. The result is a wider space between the right and left abdomen (abdominal) muscles. This wider space between the muscles may cause a bulge in the middle of your abdomen. You may notice this bulge when you are straining or when you sit up from a lying down position. Diastasis recti can affect men and women. It is most common among pregnant women, infants, people who are obese, and people who have had abdominal surgery. Exercise or surgical treatment may help correct it. What are the causes? Common causes of this condition include:  Pregnancy. The growing uterus puts pressure on the abdominal muscles, which causes the muscles to separate.  Obesity. Excess fat puts pressure on abdominal muscles.  Weightlifting.  Some abdomen exercises.  Advanced age.  Genetics.  Prior abdominal surgery.  What increases the risk? This condition is more likely to develop in:  Women.  Newborns, especially newborns who are born early (prematurely).  What are the signs or symptoms? Common symptoms of this condition include:  A bulge in the middle of the abdomen. You will notice it most when you sit up or strain.  Pain in the low back, pelvis, or hips.  Constipation.  Inability to control when you urinate (urinary incontinence).  Bloating.  Poor posture.  How is this diagnosed? This condition is diagnosed with a physical exam. Your health care provider will ask you to lie flat on your back and do a crunch or half sit-up. If you have diastasis recti, a vertical bulge will appear between your abdominal muscles in the center of your abdomen. Your health care provider will measure the gap between your muscles with one of the following:  A medical device used to measure the space between two objects (caliper).  A tape measure.  CT scan.  Ultrasound.  Finger spaces. Your health  care provider will measure the space using their fingers.  How is this treated? If your muscle separation is not too large, you may not need treatment. However, if you are a woman who plans to become pregnant again, you should treat this condition before your next pregnancy. Treatment may include:  Physical therapy to strengthen and tighten your abdominal muscles.  Lifestyle changes such as weight loss and exercise.  Over-the-counter pain medicines as needed.  Surgery to correct the separation.  Follow these instructions at home: Activity  Return to your normal activities as told by your health care provider. Ask your health care provider what activities are safe for you.  When lifting weights or doing exercises using your abdominal muscles or the muscles in the center of your body that give stability (core muscles), make sure you are doing your exercises and movements correctly. Proper form can help to prevent the condition from happening again. General instructions  If you are overweight, ask your health care provider for help with weight loss. Losing even a small amount of weight can help to improve your diastasis recti.  Take over-the-counter or prescription medicines only as told by your health care provider.  Do not strain. Straining can make the separation worse. Examples of straining include: ? Pushing hard to have a bowel movement, such as due to constipation. ? Lifting heavy objects, including children. ? Standing up and sitting down.  Take steps to prevent constipation: ? Drink enough fluid to keep your urine clear or pale yellow. ? Take over-the-counter or prescription medicines only as   directed. ? Eat foods that are high in fiber, such as fresh fruits and vegetables, whole grains, and beans. ? Limit foods that are high in fat and processed sugars, such as fried and sweet foods. Contact a health care provider if:  You notice a new bulge in your abdomen. Get help  right away if:  You experience severe discomfort in your abdomen.  You develop severe abdominal pain along with nausea, vomiting, or fever. Summary  Diastasis recti is when the abdomen (abdominal) muscles become thin and separate. Your abdomen will stick out because the space between your right and left abdomen muscles has widened.  The most common symptom is a bulge in your abdomen. You will notice it most when you sit up or are straining.  This condition is diagnosed during a physical exam.  If the abdomen separation is not too big, you may choose not to have treatment. Otherwise, you may need to undergo physical therapy or surgery. This information is not intended to replace advice given to you by your health care provider. Make sure you discuss any questions you have with your health care provider. Document Released: 08/20/2016 Document Revised: 08/20/2016 Document Reviewed: 08/20/2016 Elsevier Interactive Patient Education  2018 [employer] Inc[employer]  

## 2017-03-13 NOTE — Progress Notes (Signed)
Patient ID: Laura Murray, female   DOB: [birth year], 51 y.o.   MRN: [id number]  Chief Complaint  Patient presents with  . Hernia    HPI Laura RiddleJacqueline C Napoleon is a 51 y.o. female.  Patient here today for an evaluation of a possible hernia referred by Dr Feliberto GottronSchermerhorn.  She states that she has noticed it since her gastric bypass surgery 2006.  It does not seem to be causing any abdominal pain. She does feel it is getting larger over the past year. She has started going to the gym and she feels she notices it more. No nausea, vomiting, constipation or diarrhea noted. [facility]. 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  HPI  Past Medical History:  Diagnosis Date  . Anemia   . Insomnia   . Obesity   . Ovarian cyst   . Upper GI bleeding 2015   gastric ulcer  . Vitamin B12 deficiency   . Vitamin D deficiency     Past Surgical History:  Procedure Laterality Date  . BREAST CYST ASPIRATION Left 02/02/2014   Dr Lemar LivingsByrnett  . CHOLECYSTECTOMY  2009  . COLONOSCOPY  2014  . GASTRIC BYPASS  2006, 2015   2015 revision  . TONSILLECTOMY    . TUBAL LIGATION    . UPPER GI ENDOSCOPY      Family History  Problem Relation Age of Onset  . Lung cancer Maternal Grandfather   . Cancer Maternal Grandfather   . Diabetes Mother   . Hypertension Mother   . Diabetes Father   . Hypertension Father   . Crohn's disease Son   . Rheum arthritis Son   . Asthma Son   . Cancer Maternal Grandmother   . Breast cancer Maternal Aunt        [id number]  . Colon cancer Maternal Uncle     Social History Social History  Substance Use Topics  . Smoking status: Never Smoker  . Smokeless tobacco: Never Used  . Alcohol use No    No Known Allergies  Current Outpatient Prescriptions  Medication Sig Dispense Refill  . amitriptyline (ELAVIL) 50 MG tablet Take 1 tablet (50 mg total) by mouth at bedtime. 90 tablet 1  . cyanocobalamin (,VITAMIN B-12,) 1000 MCG/ML  injection Inject into the muscle.    . Vitamin D, Ergocalciferol, (DRISDOL) 50000 units CAPS capsule TAKE 1 CAPSULE WEEKLY 12 capsule 4  . ferumoxytol (FERAHEME) 510 MG/17ML SOLN injection Inject into the vein.     No current facility-administered medications for this visit.     Review of Systems Review of Systems  Constitutional: Negative.   Respiratory: Negative.   Cardiovascular: Negative.   Gastrointestinal: Negative for constipation and diarrhea.    Blood pressure (!) 98/58, pulse 62, resp. rate 12, height 5\' 4"  (1.626 m), weight 148 lb (67.1 kg), last menstrual period 03/04/2017.  Physical Exam Physical Exam  Constitutional: She is oriented to person, place, and time. She appears well-developed and well-nourished.  HENT:  Mouth/Throat: Oropharynx is clear and moist.  Eyes: Conjunctivae are normal. No scleral icterus.  Neck: Neck supple.  Cardiovascular: Normal rate, regular rhythm and normal heart sounds.   Pulmonary/Chest: Effort normal and breath sounds normal.  Abdominal: Soft. Normal appearance. There is no tenderness. No hernia.    Multiple old port sites. Diastasis recti present.  Lymphadenopathy:    She has no cervical adenopathy.  Neurological: She is alert and oriented to person, place, and time.  Skin: Skin is warm and dry.  Psychiatric: Her behavior is normal.      Assessment    Diastases recti without evidence of epigastric hernia.    Plan    No indication for surgical intervention. No contraindication to any exercise program.    Follow up as needed. The patient is aware to call back for any questions or new concerns.    HPI, Physical Exam, Assessment and Plan have been scribed under the direction and in the presence of Earline Mayotte, MD. Dorathy Daft, RN  I have completed the exam and reviewed the above documentation for accuracy and completeness.  I agree with the above.  Museum/gallery conservator has been used and any errors in dictation or  transcription are unintentional.  Donnalee Curry, M.D., F.A.C.S.    Earline Mayotte 03/14/2017, 6:43 AM

## 2017-03-14 DIAGNOSIS — M6208 Separation of muscle (nontraumatic), other site: Secondary | ICD-10-CM | POA: Insufficient documentation

## 2017-03-15 ENCOUNTER — Ambulatory Visit (INDEPENDENT_AMBULATORY_CARE_PROVIDER_SITE_OTHER): Payer: BLUE CROSS/BLUE SHIELD

## 2017-03-15 DIAGNOSIS — E538 Deficiency of other specified B group vitamins: Secondary | ICD-10-CM

## 2017-03-15 MED ORDER — CYANOCOBALAMIN 1000 MCG/ML IJ SOLN
1000.0000 ug | Freq: Once | INTRAMUSCULAR | Status: AC
  Administered 2017-03-15: 1000 ug via INTRAMUSCULAR

## 2017-04-12 ENCOUNTER — Ambulatory Visit (INDEPENDENT_AMBULATORY_CARE_PROVIDER_SITE_OTHER): Payer: BLUE CROSS/BLUE SHIELD | Admitting: Emergency Medicine

## 2017-04-12 DIAGNOSIS — E538 Deficiency of other specified B group vitamins: Secondary | ICD-10-CM | POA: Diagnosis not present

## 2017-04-12 MED ORDER — CYANOCOBALAMIN 1000 MCG/ML IJ SOLN
1000.0000 ug | Freq: Once | INTRAMUSCULAR | Status: AC
  Administered 2017-04-12: 1000 ug via INTRAMUSCULAR

## 2017-05-15 ENCOUNTER — Telehealth: Payer: Self-pay | Admitting: *Deleted

## 2017-05-15 ENCOUNTER — Inpatient Hospital Stay: Payer: BLUE CROSS/BLUE SHIELD | Attending: Internal Medicine

## 2017-05-15 DIAGNOSIS — N920 Excessive and frequent menstruation with regular cycle: Secondary | ICD-10-CM | POA: Insufficient documentation

## 2017-05-15 DIAGNOSIS — Z79899 Other long term (current) drug therapy: Secondary | ICD-10-CM | POA: Diagnosis not present

## 2017-05-15 DIAGNOSIS — N83209 Unspecified ovarian cyst, unspecified side: Secondary | ICD-10-CM | POA: Diagnosis not present

## 2017-05-15 DIAGNOSIS — E669 Obesity, unspecified: Secondary | ICD-10-CM | POA: Insufficient documentation

## 2017-05-15 DIAGNOSIS — E559 Vitamin D deficiency, unspecified: Secondary | ICD-10-CM | POA: Diagnosis not present

## 2017-05-15 DIAGNOSIS — D500 Iron deficiency anemia secondary to blood loss (chronic): Secondary | ICD-10-CM

## 2017-05-15 DIAGNOSIS — K921 Melena: Secondary | ICD-10-CM | POA: Insufficient documentation

## 2017-05-15 DIAGNOSIS — G4700 Insomnia, unspecified: Secondary | ICD-10-CM | POA: Diagnosis not present

## 2017-05-15 DIAGNOSIS — Z9884 Bariatric surgery status: Secondary | ICD-10-CM | POA: Insufficient documentation

## 2017-05-15 DIAGNOSIS — Z8711 Personal history of peptic ulcer disease: Secondary | ICD-10-CM | POA: Diagnosis not present

## 2017-05-15 DIAGNOSIS — Z8719 Personal history of other diseases of the digestive system: Secondary | ICD-10-CM | POA: Diagnosis not present

## 2017-05-15 DIAGNOSIS — D72819 Decreased white blood cell count, unspecified: Secondary | ICD-10-CM | POA: Insufficient documentation

## 2017-05-15 LAB — CBC WITH DIFFERENTIAL/PLATELET
BASOS ABS: 0 10*3/uL (ref 0–0.1)
Basophils Relative: 1 %
EOS PCT: 6 %
Eosinophils Absolute: 0.2 10*3/uL (ref 0–0.7)
HCT: 36.4 % (ref 35.0–47.0)
Hemoglobin: 12 g/dL (ref 12.0–16.0)
LYMPHS PCT: 31 %
Lymphs Abs: 1 10*3/uL (ref 1.0–3.6)
MCH: 30.8 pg (ref 26.0–34.0)
MCHC: 33 g/dL (ref 32.0–36.0)
MCV: 93.4 fL (ref 80.0–100.0)
MONO ABS: 0.3 10*3/uL (ref 0.2–0.9)
MONOS PCT: 11 %
Neutro Abs: 1.6 10*3/uL (ref 1.4–6.5)
Neutrophils Relative %: 51 %
PLATELETS: 218 10*3/uL (ref 150–440)
RBC: 3.9 MIL/uL (ref 3.80–5.20)
RDW: 13.8 % (ref 11.5–14.5)
WBC: 3.2 10*3/uL — ABNORMAL LOW (ref 3.6–11.0)

## 2017-05-15 LAB — COMPREHENSIVE METABOLIC PANEL
ALBUMIN: 3.7 g/dL (ref 3.5–5.0)
ALT: 37 U/L (ref 14–54)
AST: 31 U/L (ref 15–41)
Alkaline Phosphatase: 81 U/L (ref 38–126)
Anion gap: 5 (ref 5–15)
BILIRUBIN TOTAL: 0.5 mg/dL (ref 0.3–1.2)
BUN: 15 mg/dL (ref 6–20)
CO2: 24 mmol/L (ref 22–32)
Calcium: 8.6 mg/dL — ABNORMAL LOW (ref 8.9–10.3)
Chloride: 105 mmol/L (ref 101–111)
Creatinine, Ser: 0.71 mg/dL (ref 0.44–1.00)
GFR calc Af Amer: >60 mL/min (ref >60)
GFR calc non Af Amer: >60 mL/min (ref >60)
GLUCOSE: 98 mg/dL (ref 65–99)
POTASSIUM: 3.9 mmol/L (ref 3.5–5.1)
Sodium: 134 mmol/L — ABNORMAL LOW (ref 135–145)
TOTAL PROTEIN: 7.4 g/dL (ref 6.5–8.1)

## 2017-05-15 LAB — IRON AND TIBC
Iron: 71 ug/dL (ref 28–170)
SATURATION RATIOS: 16 % (ref 10.4–31.8)
TIBC: 451 ug/dL — ABNORMAL HIGH (ref 250–450)
UIBC: 380 ug/dL

## 2017-05-15 LAB — FERRITIN: Ferritin: 11 ng/mL (ref 11–307)

## 2017-05-15 NOTE — Telephone Encounter (Signed)
Yes, this is fine-to r/s her apts to next month.

## 2017-05-15 NOTE — Telephone Encounter (Signed)
-----   Message from Synetta Laura Murray sent at 05/15/2017 11:03 AM EST ----- Pt had lab today she is to come on 11/9 but she has another appt with mom due to stroke can she come next month if they are good.

## 2017-05-17 ENCOUNTER — Inpatient Hospital Stay: Payer: BLUE CROSS/BLUE SHIELD

## 2017-05-17 ENCOUNTER — Inpatient Hospital Stay: Payer: BLUE CROSS/BLUE SHIELD | Admitting: Internal Medicine

## 2017-05-24 ENCOUNTER — Ambulatory Visit (INDEPENDENT_AMBULATORY_CARE_PROVIDER_SITE_OTHER): Payer: BLUE CROSS/BLUE SHIELD

## 2017-05-24 DIAGNOSIS — E538 Deficiency of other specified B group vitamins: Secondary | ICD-10-CM | POA: Diagnosis not present

## 2017-05-24 MED ORDER — CYANOCOBALAMIN 1000 MCG/ML IJ SOLN
1000.0000 ug | Freq: Once | INTRAMUSCULAR | Status: AC
  Administered 2017-05-24: 1000 ug via INTRAMUSCULAR

## 2017-06-21 ENCOUNTER — Ambulatory Visit (INDEPENDENT_AMBULATORY_CARE_PROVIDER_SITE_OTHER): Payer: BLUE CROSS/BLUE SHIELD

## 2017-06-21 DIAGNOSIS — E538 Deficiency of other specified B group vitamins: Secondary | ICD-10-CM

## 2017-06-21 MED ORDER — CYANOCOBALAMIN 1000 MCG/ML IJ SOLN
1000.0000 ug | Freq: Once | INTRAMUSCULAR | Status: AC
  Administered 2017-06-21: 1000 ug via INTRAMUSCULAR

## 2017-06-28 ENCOUNTER — Inpatient Hospital Stay: Payer: BLUE CROSS/BLUE SHIELD | Attending: Internal Medicine | Admitting: Internal Medicine

## 2017-06-28 ENCOUNTER — Inpatient Hospital Stay: Payer: BLUE CROSS/BLUE SHIELD

## 2017-06-28 VITALS — BP 121/75 | HR 62 | Temp 97.3°F | Resp 16 | Wt 149.4 lb

## 2017-06-28 DIAGNOSIS — N83209 Unspecified ovarian cyst, unspecified side: Secondary | ICD-10-CM | POA: Diagnosis not present

## 2017-06-28 DIAGNOSIS — Z79899 Other long term (current) drug therapy: Secondary | ICD-10-CM | POA: Insufficient documentation

## 2017-06-28 DIAGNOSIS — G4700 Insomnia, unspecified: Secondary | ICD-10-CM | POA: Diagnosis not present

## 2017-06-28 DIAGNOSIS — E559 Vitamin D deficiency, unspecified: Secondary | ICD-10-CM | POA: Diagnosis not present

## 2017-06-28 DIAGNOSIS — R5383 Other fatigue: Secondary | ICD-10-CM | POA: Diagnosis not present

## 2017-06-28 DIAGNOSIS — N920 Excessive and frequent menstruation with regular cycle: Secondary | ICD-10-CM | POA: Diagnosis not present

## 2017-06-28 DIAGNOSIS — Z8711 Personal history of peptic ulcer disease: Secondary | ICD-10-CM | POA: Diagnosis not present

## 2017-06-28 DIAGNOSIS — Z9884 Bariatric surgery status: Secondary | ICD-10-CM

## 2017-06-28 DIAGNOSIS — E669 Obesity, unspecified: Secondary | ICD-10-CM | POA: Insufficient documentation

## 2017-06-28 DIAGNOSIS — D72819 Decreased white blood cell count, unspecified: Secondary | ICD-10-CM | POA: Insufficient documentation

## 2017-06-28 DIAGNOSIS — D500 Iron deficiency anemia secondary to blood loss (chronic): Secondary | ICD-10-CM | POA: Diagnosis not present

## 2017-06-28 DIAGNOSIS — D509 Iron deficiency anemia, unspecified: Secondary | ICD-10-CM

## 2017-06-28 MED ORDER — SODIUM CHLORIDE 0.9 % IV SOLN
Freq: Once | INTRAVENOUS | Status: AC
  Administered 2017-06-28: 12:00:00 via INTRAVENOUS
  Filled 2017-06-28: qty 1000

## 2017-06-28 MED ORDER — SODIUM CHLORIDE 0.9 % IV SOLN
510.0000 mg | Freq: Once | INTRAVENOUS | Status: AC
  Administered 2017-06-28: 510 mg via INTRAVENOUS
  Filled 2017-06-28: qty 17

## 2017-06-28 NOTE — Assessment & Plan Note (Addendum)
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 Hemoglobin is 12.5 today; ferritin -11; sat- 16%.  Proceed with IV Feraheme because of fatigue.  # Fatigue- ? Sec to low Iron vs other.  Trial of IV Feraheme.  # Mild leukopenia white count 2.8 ANC 1.7 however lymphocyte count 0.8 chronic/intermittent over 1 year at least- discussed with the patient.  Would not recommend a bone marrow biopsy.  Monitor for now. If worse than recommend further workup.  #Elevated B12-on monthly IM injections; question spacing out every 2-3 months.  Defer to PCP  #Low vitamin D-continue 50,000 units of vitamin D3  # follow up in 6 months/ labs- few days prior/ferrahem infusion prior.IV Ferrahem today.

## 2017-06-28 NOTE — Progress Notes (Signed)
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   Patient continues to complain of fatigue.  Denies any significant vaginal bleeding at this time.  Complains of difficulty sleeping at night.  She takes a sleeping pill as needed.  Denies any shortness of breath or chest pain. Denies any tingling or numbness. No nausea no vomiting.   REVIEW OF SYSTEMS:  A complete 10 point review of system is done which is negative except mentioned above/history of present illness.   PAST MEDICAL HISTORY :  Past Medical History:  Diagnosis Date  . Anemia   . Insomnia   . Obesity   . Ovarian cyst   . Upper GI bleeding 2015   gastric ulcer  . Vitamin B12 deficiency   . Vitamin D deficiency     PAST SURGICAL HISTORY :   Past Surgical History:  Procedure Laterality Date  . BREAST CYST ASPIRATION Left 02/02/2014   Dr Bary Castilla  . CHOLECYSTECTOMY  2009  . COLONOSCOPY  2014  . GASTRIC BYPASS  2006, 2015   2015 revision  . TONSILLECTOMY    . TUBAL LIGATION    . UPPER GI ENDOSCOPY      FAMILY HISTORY :   Family History  Problem Relation Age of Onset  . Lung cancer Maternal Grandfather   . Cancer Maternal Grandfather   . Diabetes Mother   . Hypertension Mother   . Diabetes Father   . Hypertension Father   . Crohn's disease Son   . Rheum arthritis Son   . Asthma Son   . Cancer Maternal Grandmother   . Breast cancer  Maternal Aunt        [id number]  . Colon cancer Maternal Uncle     SOCIAL HISTORY:   Social History   Tobacco Use  . Smoking status: Never Smoker  . Smokeless tobacco: Never Used  Substance Use Topics  . Alcohol use: No  . Drug use: No    ALLERGIES:  has No Known Allergies.  MEDICATIONS:  Current Outpatient Medications  Medication Sig Dispense Refill  . amitriptyline (ELAVIL) 50 MG tablet Take 1 tablet (50 mg total) by mouth at bedtime. 90 tablet 1  . cyanocobalamin (,VITAMIN B-12,) 1000 MCG/ML injection Inject into the muscle.    . ferumoxytol (FERAHEME) 510 MG/17ML SOLN injection Inject into the vein.    . Vitamin D, Ergocalciferol, (DRISDOL) 50000 units CAPS capsule TAKE 1 CAPSULE WEEKLY 12 capsule 4   No current facility-administered medications for this visit.    Facility-Administered Medications Ordered in Other Visits  Medication Dose Route Frequency Provider Last Rate Last Dose  . 0.9 %  sodium chloride infusion   Intravenous Once Cammie Sickle, MD   Stopped at 06/28/17 1205    PHYSICAL EXAMINATION: ECOG PERFORMANCE STATUS: 0 - Asymptomatic  BP 121/75 (BP Location: Left Arm, Patient Position: Sitting)   Pulse 62   Temp (!) 97.3 F (36.3 C)   Resp 16   Wt  149 lb 6.4 oz (67.8 kg)   BMI 25.64 kg/m   Filed Weights   06/28/17 1033  Weight: 149 lb 6.4 oz (67.8 kg)    GENERAL: Well-nourished well-developed; Alert, no distress and comfortable.  Accompanied by family. EYES: no pallor or icterus OROPHARYNX: no thrush or ulceration; good dentition  NECK: supple, no masses felt LYMPH:  no palpable lymphadenopathy in the cervical, axillary or inguinal regions LUNGS: clear to auscultation and  No wheeze or crackles HEART/CVS: regular rate & rhythm and no murmurs; No lower extremity edema ABDOMEN:abdomen soft, non-tender and normal bowel sounds Musculoskeletal:no cyanosis of digits and no clubbing  PSYCH: alert & oriented x 3 with fluent speech NEURO: no  focal motor/sensory deficits SKIN:  no rashes or significant lesions  LABORATORY DATA:  I have reviewed the data as listed    Component Value Date/Time   NA 134 (L) 05/15/2017 1051   NA 141 06/10/2015 0929   NA 142 03/23/2013 0428   K 3.9 05/15/2017 1051   K 4.1 06/29/2013 0902   CL 105 05/15/2017 1051   CL 112 (H) 03/23/2013 0428   CO2 24 05/15/2017 1051   CO2 24 03/23/2013 0428   GLUCOSE 98 05/15/2017 1051   GLUCOSE 90 03/23/2013 0428   BUN 15 05/15/2017 1051   BUN 11 06/10/2015 0929   BUN 5 (L) 03/23/2013 0428   CREATININE 0.71 05/15/2017 1051   CREATININE 0.75 06/12/2013 0842   CALCIUM 8.6 (L) 05/15/2017 1051   CALCIUM 7.7 (L) 03/23/2013 0428   PROT 7.4 05/15/2017 1051   PROT 6.6 06/10/2015 0929   PROT 6.1 (L) 03/20/2013 1843   ALBUMIN 3.7 05/15/2017 1051   ALBUMIN 3.9 06/10/2015 0929   ALBUMIN 3.0 (L) 03/20/2013 1843   AST 31 05/15/2017 1051   AST 25 03/20/2013 1843   ALT 37 05/15/2017 1051   ALT 49 03/20/2013 1843   ALKPHOS 81 05/15/2017 1051   ALKPHOS 84 03/20/2013 1843   BILITOT 0.5 05/15/2017 1051   BILITOT 0.3 06/10/2015 0929   BILITOT 0.1 (L) 03/20/2013 1843   GFRNONAA >60 05/15/2017 1051   GFRNONAA >60 06/12/2013 0842   GFRAA >60 05/15/2017 1051   GFRAA >60 06/12/2013 0842    No results found for: SPEP, UPEP  Lab Results  Component Value Date   WBC 3.2 (L) 05/15/2017   NEUTROABS 1.6 05/15/2017   HGB 12.0 05/15/2017   HCT 36.4 05/15/2017   MCV 93.4 05/15/2017   PLT 218 05/15/2017      Chemistry      Component Value Date/Time   NA 134 (L) 05/15/2017 1051   NA 141 06/10/2015 0929   NA 142 03/23/2013 0428   K 3.9 05/15/2017 1051   K 4.1 06/29/2013 0902   CL 105 05/15/2017 1051   CL 112 (H) 03/23/2013 0428   CO2 24 05/15/2017 1051   CO2 24 03/23/2013 0428   BUN 15 05/15/2017 1051   BUN 11 06/10/2015 0929   BUN 5 (L) 03/23/2013 0428   CREATININE 0.71 05/15/2017 1051   CREATININE 0.75 06/12/2013 0842   GLU 82 10/16/2013      Component  Value Date/Time   CALCIUM 8.6 (L) 05/15/2017 1051   CALCIUM 7.7 (L) 03/23/2013 0428   ALKPHOS 81 05/15/2017 1051   ALKPHOS 84 03/20/2013 1843   AST 31 05/15/2017 1051   AST 25 03/20/2013 1843   ALT 37 05/15/2017 1051   ALT 49 03/20/2013 1843   BILITOT 0.5 05/15/2017 1051   BILITOT 0.3  06/10/2015 0929   BILITOT 0.1 (L) 03/20/2013 1843        ASSESSMENT & PLAN:  Iron deficiency anemia due to chronic blood loss # 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 Hemoglobin is 12.5 today; ferritin -11; sat- 16%.  Proceed with IV Feraheme because of fatigue.  # Fatigue- ? Sec to low Iron vs other.  Trial of IV Feraheme.  # Mild leukopenia white count 2.8 ANC 1.7 however lymphocyte count 0.8 chronic/intermittent over 1 year at least- discussed with the patient.  Would not recommend a bone marrow biopsy.  Monitor for now. If worse than recommend further workup.  #Elevated B12-on monthly IM injections; question spacing out every 2-3 months.  Defer to PCP  #Low vitamin D-continue 50,000 units of vitamin D3  # follow up in 6 months/ labs- few days prior/ferrahem infusion prior.IV Ferrahem today.      Cammie Sickle, MD 06/28/2017 11:59 AM

## 2017-07-18 ENCOUNTER — Telehealth: Payer: Self-pay | Admitting: Family Medicine

## 2017-07-18 NOTE — Telephone Encounter (Signed)
Patient states that she had blood work done at the cancer center and they told her that her B12 level was too high to get B12 injections monthly.  She would like for you to review her lab work and let her know how often she will need to get B12 injections.  If we call her today we will need to call her at work at [phone] and if we call tomorrow call [phone].

## 2017-07-18 NOTE — Telephone Encounter (Signed)
Patient was advised. Patient scheduled appt for 07/26/2017.

## 2017-07-18 NOTE — Telephone Encounter (Signed)
Please advise 

## 2017-07-18 NOTE — Telephone Encounter (Signed)
Is due for follow up and will recheck then. Should have levels checked about 2 weeks after B12 injection.

## 2017-07-26 ENCOUNTER — Ambulatory Visit: Payer: Self-pay

## 2017-07-26 ENCOUNTER — Ambulatory Visit: Payer: Self-pay | Admitting: Family Medicine

## 2017-09-18 ENCOUNTER — Encounter: Payer: Self-pay | Admitting: Family Medicine

## 2017-09-18 ENCOUNTER — Ambulatory Visit (INDEPENDENT_AMBULATORY_CARE_PROVIDER_SITE_OTHER): Payer: BLUE CROSS/BLUE SHIELD | Admitting: Family Medicine

## 2017-09-18 VITALS — BP 110/70 | HR 55 | Temp 98.3°F | Resp 16 | Wt 149.0 lb

## 2017-09-18 DIAGNOSIS — N760 Acute vaginitis: Secondary | ICD-10-CM | POA: Diagnosis not present

## 2017-09-18 DIAGNOSIS — D500 Iron deficiency anemia secondary to blood loss (chronic): Secondary | ICD-10-CM | POA: Diagnosis not present

## 2017-09-18 DIAGNOSIS — E538 Deficiency of other specified B group vitamins: Secondary | ICD-10-CM

## 2017-09-18 DIAGNOSIS — E559 Vitamin D deficiency, unspecified: Secondary | ICD-10-CM | POA: Diagnosis not present

## 2017-09-18 MED ORDER — TERCONAZOLE 0.4 % VA CREA: 1.0000 | TOPICAL_CREAM | Freq: Every day | VAGINAL | 0 refills | Status: DC

## 2017-09-18 NOTE — Progress Notes (Addendum)
       Patient: Laura Murray Female    DOB: [birth year]   52 y.o.   MRN: [id number] Visit Date: 09/18/2017  Today's Provider: Mila Merryonald [name], MD   Chief Complaint  Patient presents with  . Follow-up   Subjective:    HPI  B12 deficiency From 08/03/2016-labs checked, no changes. Since office visit: patient had labs done through cancer center. At that time B12 levels were >2000. Her last B12 injection was in December. She does have history of gastric bypass, but is not taking any B12 supplements. She states she feels well with no trouble with fatigue.   Vitamin D deficiency From 08/03/2016-labs checked, showing level was low. Recommended changing to otc Vitamin D 5,000 units qd.  Also patient states she has had vaginal itching for 2 days. No discharge or rash.   No Known Allergies   Current Outpatient Medications:  .  amitriptyline (ELAVIL) 50 MG tablet, Take 1 tablet (50 mg total) by mouth at bedtime., Disp: 90 tablet, Rfl: 1 .  cyanocobalamin (,VITAMIN B-12,) 1000 MCG/ML injection, Inject into the muscle., Disp: , Rfl:  .  ferumoxytol (FERAHEME) 510 MG/17ML SOLN injection, Inject into the vein., Disp: , Rfl:  .  Vitamin D, Ergocalciferol, (DRISDOL) 50000 units CAPS capsule, TAKE 1 CAPSULE WEEKLY, Disp: 12 capsule, Rfl: 4  Review of Systems  Constitutional: Negative for appetite change, chills, fatigue and fever.  Respiratory: Negative for chest tightness and shortness of breath.   Cardiovascular: Negative for chest pain and palpitations.  Gastrointestinal: Negative for abdominal pain, nausea and vomiting.  Neurological: Negative for dizziness and weakness.    Social History   Tobacco Use  . Smoking status: Never Smoker  . Smokeless tobacco: Never Used  Substance Use Topics  . Alcohol use: No   Objective:   BP 110/70 (BP Location: Right Arm, Patient Position: Sitting, Cuff Size: Normal)   Pulse (!) 55   Temp 98.3 F (36.8 C) (Oral)   Resp 16   SpO2 99%    Vitals:   09/18/17 0832  BP: 110/70  Pulse: (!) 55  Resp: 16  Temp: 98.3 F (36.8 C)  TempSrc: Oral  SpO2: 99%     Physical Exam   General Appearance:    Alert, cooperative, no distress  Eyes:    PERRL, conjunctiva/corneas clear, EOM's intact       Lungs:     Clear to auscultation bilaterally, respirations unlabored  Heart:    Regular rate and rhythm  Neurologic:   Awake, alert, oriented x 3. No apparent focal neurological           defect.           Assessment & Plan:     .1. B12 deficiency Off parenteral B12 for the last 3 months. - Vitamin B12  2. Vitamin D deficiency  - VITAMIN D 25 Hydroxy (Vit-D Deficiency, Fractures)  3. Iron deficiency anemia due to chronic blood loss Continue routine follow up hematology  4. Acute vaginitis Terazole 7       Mila Merryonald [name], MD  [facility]

## 2017-09-19 ENCOUNTER — Other Ambulatory Visit: Payer: Self-pay

## 2017-09-19 DIAGNOSIS — N760 Acute vaginitis: Secondary | ICD-10-CM

## 2017-09-19 LAB — VITAMIN D 25 HYDROXY (VIT D DEFICIENCY, FRACTURES): Vit D, 25-Hydroxy: 34.2 ng/mL (ref 30.0–100.0)

## 2017-09-19 LAB — VITAMIN B12: Vitamin B-12: 325 pg/mL (ref 232–1245)

## 2017-09-19 MED ORDER — TERCONAZOLE 0.4 % VA CREA: 1.0000 | TOPICAL_CREAM | Freq: Every day | VAGINAL | 0 refills | Status: DC

## 2017-09-19 NOTE — Telephone Encounter (Signed)
Patient called stating prescription was sent into wrong pharmacy. [facility]ent rx to [facility]

## 2017-09-20 ENCOUNTER — Telehealth: Payer: Self-pay

## 2017-09-20 NOTE — Telephone Encounter (Signed)
LMOVM for pt to return call 

## 2017-09-20 NOTE — Telephone Encounter (Signed)
Follow up o.v. And labs in 6 months. (looks like she is due for CPE too)

## 2017-09-20 NOTE — Telephone Encounter (Signed)
Patient advised.KW 

## 2017-09-20 NOTE — Telephone Encounter (Signed)
-----   Message from Malva Limesonald E [name], MD sent at 09/19/2017 10:03 AM EDT ----- Vitamin b12 level is a little low. She should take daily vitamin b12 supplement, 1000mg . A dissolvable tablet would work best due to history of gastric bypass. She doesn't need shots anymore. Vitamin d levels are OK. Continue weekly vitamin D supplement.s follow up to check once a day.

## 2017-09-20 NOTE — Telephone Encounter (Signed)
Patient advised of results and verbally voiced understanding. Please clarify how soon patient needs to follow up.

## 2017-10-23 ENCOUNTER — Other Ambulatory Visit: Payer: Self-pay | Admitting: Family Medicine

## 2017-12-27 ENCOUNTER — Other Ambulatory Visit: Payer: Self-pay

## 2017-12-27 ENCOUNTER — Inpatient Hospital Stay: Payer: BLUE CROSS/BLUE SHIELD | Attending: Internal Medicine

## 2017-12-27 DIAGNOSIS — D500 Iron deficiency anemia secondary to blood loss (chronic): Secondary | ICD-10-CM | POA: Diagnosis present

## 2017-12-27 DIAGNOSIS — N920 Excessive and frequent menstruation with regular cycle: Secondary | ICD-10-CM | POA: Insufficient documentation

## 2017-12-27 LAB — COMPREHENSIVE METABOLIC PANEL
ALBUMIN: 3.5 g/dL (ref 3.5–5.0)
ALK PHOS: 51 U/L (ref 38–126)
ALT: 23 U/L (ref 14–54)
ANION GAP: 3 — AB (ref 5–15)
AST: 24 U/L (ref 15–41)
BILIRUBIN TOTAL: 0.4 mg/dL (ref 0.3–1.2)
BUN: 15 mg/dL (ref 6–20)
CO2: 25 mmol/L (ref 22–32)
Calcium: 8.7 mg/dL — ABNORMAL LOW (ref 8.9–10.3)
Chloride: 109 mmol/L (ref 101–111)
Creatinine, Ser: 0.71 mg/dL (ref 0.44–1.00)
GFR calc Af Amer: >60 mL/min (ref >60)
GFR calc non Af Amer: >60 mL/min (ref >60)
GLUCOSE: 93 mg/dL (ref 65–99)
Potassium: 4 mmol/L (ref 3.5–5.1)
Sodium: 137 mmol/L (ref 135–145)
TOTAL PROTEIN: 6.6 g/dL (ref 6.5–8.1)

## 2017-12-27 LAB — CBC WITH DIFFERENTIAL/PLATELET
Basophils Absolute: 0 10*3/uL (ref 0–0.1)
Basophils Relative: 0 %
Eosinophils Absolute: 0.1 10*3/uL (ref 0–0.7)
Eosinophils Relative: 2 %
HEMATOCRIT: 36.5 % (ref 35.0–47.0)
HEMOGLOBIN: 12.3 g/dL (ref 12.0–16.0)
Lymphocytes Relative: 21 %
Lymphs Abs: 0.8 10*3/uL — ABNORMAL LOW (ref 1.0–3.6)
MCH: 32.2 pg (ref 26.0–34.0)
MCHC: 33.8 g/dL (ref 32.0–36.0)
MCV: 95.2 fL (ref 80.0–100.0)
Monocytes Absolute: 0.3 10*3/uL (ref 0.2–0.9)
Monocytes Relative: 8 %
NEUTROS ABS: 2.6 10*3/uL (ref 1.4–6.5)
Neutrophils Relative %: 69 %
Platelets: 202 10*3/uL (ref 150–440)
RBC: 3.83 MIL/uL (ref 3.80–5.20)
RDW: 13.4 % (ref 11.5–14.5)
WBC: 3.7 10*3/uL (ref 3.6–11.0)

## 2017-12-27 LAB — IRON AND TIBC
IRON: 102 ug/dL (ref 28–170)
Saturation Ratios: 31 % (ref 10.4–31.8)
TIBC: 332 ug/dL (ref 250–450)
UIBC: 230 ug/dL

## 2017-12-27 LAB — FERRITIN: FERRITIN: 32 ng/mL (ref 11–307)

## 2018-01-03 ENCOUNTER — Ambulatory Visit: Payer: BLUE CROSS/BLUE SHIELD | Admitting: Internal Medicine

## 2018-01-03 ENCOUNTER — Ambulatory Visit: Payer: BLUE CROSS/BLUE SHIELD

## 2018-01-06 ENCOUNTER — Inpatient Hospital Stay: Payer: BLUE CROSS/BLUE SHIELD

## 2018-01-06 ENCOUNTER — Encounter: Payer: Self-pay | Admitting: Internal Medicine

## 2018-01-06 ENCOUNTER — Inpatient Hospital Stay: Payer: BLUE CROSS/BLUE SHIELD | Attending: Internal Medicine | Admitting: Internal Medicine

## 2018-01-06 VITALS — BP 123/65 | HR 74 | Temp 98.1°F | Resp 16 | Wt 134.6 lb

## 2018-01-06 DIAGNOSIS — G4700 Insomnia, unspecified: Secondary | ICD-10-CM | POA: Insufficient documentation

## 2018-01-06 DIAGNOSIS — Z79899 Other long term (current) drug therapy: Secondary | ICD-10-CM | POA: Insufficient documentation

## 2018-01-06 DIAGNOSIS — D500 Iron deficiency anemia secondary to blood loss (chronic): Secondary | ICD-10-CM | POA: Insufficient documentation

## 2018-01-06 DIAGNOSIS — E538 Deficiency of other specified B group vitamins: Secondary | ICD-10-CM | POA: Diagnosis not present

## 2018-01-06 DIAGNOSIS — K922 Gastrointestinal hemorrhage, unspecified: Secondary | ICD-10-CM | POA: Insufficient documentation

## 2018-01-06 DIAGNOSIS — Z9884 Bariatric surgery status: Secondary | ICD-10-CM | POA: Diagnosis not present

## 2018-01-06 NOTE — Progress Notes (Signed)
[facility] OFFICE PROGRESS NOTE  Patient Care Team: Malva Limes, MD as PCP - General (Family Medicine) Marin Roberts, MD as Referring Physician (Internal Medicine) Lemar Livings Merrily Pew, MD as Consulting Physician (General Surgery) Schermerhorn, Ihor Austin, MD as Referring Physician (Obstetrics and Gynecology)   SUMMARY OF ONCOLOGIC HISTORY:  # IDA [place] Breeding to gastric Bypass 2002/ menstrual blood loss]; IV iron q 6 M/ B12 IM [thru PCP]  INTERVAL HISTORY:  A very pleasant 52 year-old female patient with above history of 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   Patient denies any fatigue.  Denies any nausea vomiting.  Denies abdominal pain.  No weight loss.  Review of Systems  Constitutional: Negative for chills, diaphoresis, fever, malaise/fatigue and weight loss.  HENT: Negative for nosebleeds and sore throat.   Eyes: Negative for double vision.  Respiratory: Negative for cough, hemoptysis, sputum production, shortness of breath and wheezing.   Cardiovascular: Negative for chest pain, palpitations, orthopnea and leg swelling.  Gastrointestinal: Negative for abdominal pain, blood in stool, constipation, diarrhea, heartburn, melena, nausea and vomiting.  Genitourinary: Negative for dysuria, frequency and urgency.  Musculoskeletal: Negative for back pain and joint pain.  Skin: Negative.  Negative for itching and rash.  Neurological: Negative for dizziness, tingling, focal weakness, weakness and headaches.  Endo/Heme/Allergies: Does not bruise/bleed easily.  Psychiatric/Behavioral: Negative for depression. The patient is not nervous/anxious and does not have insomnia.    Marland Kitchen   PAST MEDICAL HISTORY :  Past Medical History:  Diagnosis Date  . Insomnia   . Obesity   . Ovarian cyst   . Upper GI bleeding 2015   gastric ulcer    PAST SURGICAL HISTORY :   Past Surgical History:  Procedure Laterality Date  . BREAST CYST  ASPIRATION Left 02/02/2014   Dr Lemar Livings  . CHOLECYSTECTOMY  2009  . COLONOSCOPY  2014  . GASTRIC BYPASS  2006, 2015   2015 revision  . TONSILLECTOMY    . TUBAL LIGATION    . UPPER GI ENDOSCOPY      FAMILY HISTORY :   Family History  Problem Relation Age of Onset  . Lung cancer Maternal Grandfather   . Cancer Maternal Grandfather   . Diabetes Mother   . Hypertension Mother   . Diabetes Father   . Hypertension Father   . Crohn's disease Son   . Rheum arthritis Son   . Asthma Son   . Cancer Maternal Grandmother   . Breast cancer Maternal Aunt        [id number]  . Colon cancer Maternal Uncle     SOCIAL HISTORY:   Social History   Tobacco Use  . Smoking status: Never Smoker  . Smokeless tobacco: Never Used  Substance Use Topics  . Alcohol use: No  . Drug use: No    ALLERGIES:  has No Known Allergies.  MEDICATIONS:  Current Outpatient Medications  Medication Sig Dispense Refill  . amitriptyline (ELAVIL) 50 MG tablet Take 1 tablet (50 mg total) by mouth at bedtime. 90 tablet 1  . cyanocobalamin (,VITAMIN B-12,) 1000 MCG/ML injection Inject into the muscle.    . ferumoxytol (FERAHEME) 510 MG/17ML SOLN injection Inject into the vein.    . Vitamin D, Ergocalciferol, (DRISDOL) 50000 units CAPS capsule TAKE 1 CAPSULE WEEKLY 12 capsule 4   No current facility-administered medications for this visit.     PHYSICAL EXAMINATION: ECOG PERFORMANCE STATUS: 0 - Asymptomatic  BP 123/65 (BP Location: Left  Arm, Patient Position: Sitting)   Pulse 74   Temp 98.1 F (36.7 C) (Tympanic)   Resp 16   Wt 134 lb 9.6 oz (61.1 kg)   BMI 23.10 kg/m   Filed Weights   01/06/18 1322  Weight: 134 lb 9.6 oz (61.1 kg)    GENERAL: Well-nourished well-developed; Alert, no distress and comfortable.  She is alone.  EYES: no pallor or icterus OROPHARYNX: no thrush or ulceration; NECK: supple; no lymph nodes felt. LYMPH:  no palpable lymphadenopathy in the axillary or inguinal  regions LUNGS: Decreased breath sounds auscultation bilaterally. No wheeze or crackles HEART/CVS: regular rate & rhythm and no murmurs; No lower extremity edema ABDOMEN:abdomen soft, non-tender and normal bowel sounds. No hepatomegaly or splenomegaly.  Musculoskeletal:no cyanosis of digits and no clubbing  PSYCH: alert & oriented x 3 with fluent speech NEURO: no focal motor/sensory deficits SKIN:  no rashes or significant lesions  LABORATORY DATA:  I have reviewed the data as listed    Component Value Date/Time   NA 137 12/27/2017 1105   NA 141 06/10/2015 0929   NA 142 03/23/2013 0428   K 4.0 12/27/2017 1105   K 4.1 06/29/2013 0902   CL 109 12/27/2017 1105   CL 112 (H) 03/23/2013 0428   CO2 25 12/27/2017 1105   CO2 24 03/23/2013 0428   GLUCOSE 93 12/27/2017 1105   GLUCOSE 90 03/23/2013 0428   BUN 15 12/27/2017 1105   BUN 11 06/10/2015 0929   BUN 5 (L) 03/23/2013 0428   CREATININE 0.71 12/27/2017 1105   CREATININE 0.75 06/12/2013 0842   CALCIUM 8.7 (L) 12/27/2017 1105   CALCIUM 7.7 (L) 03/23/2013 0428   PROT 6.6 12/27/2017 1105   PROT 6.6 06/10/2015 0929   PROT 6.1 (L) 03/20/2013 1843   ALBUMIN 3.5 12/27/2017 1105   ALBUMIN 3.9 06/10/2015 0929   ALBUMIN 3.0 (L) 03/20/2013 1843   AST 24 12/27/2017 1105   AST 25 03/20/2013 1843   ALT 23 12/27/2017 1105   ALT 49 03/20/2013 1843   ALKPHOS 51 12/27/2017 1105   ALKPHOS 84 03/20/2013 1843   BILITOT 0.4 12/27/2017 1105   BILITOT 0.3 06/10/2015 0929   BILITOT 0.1 (L) 03/20/2013 1843   GFRNONAA >60 12/27/2017 1105   GFRNONAA >60 06/12/2013 0842   GFRAA >60 12/27/2017 1105   GFRAA >60 06/12/2013 0842    No results found for: SPEP, UPEP  Lab Results  Component Value Date   WBC 3.7 12/27/2017   NEUTROABS 2.6 12/27/2017   HGB 12.3 12/27/2017   HCT 36.5 12/27/2017   MCV 95.2 12/27/2017   PLT 202 12/27/2017      Chemistry      Component Value Date/Time   NA 137 12/27/2017 1105   NA 141 06/10/2015 0929   NA 142  03/23/2013 0428   K 4.0 12/27/2017 1105   K 4.1 06/29/2013 0902   CL 109 12/27/2017 1105   CL 112 (H) 03/23/2013 0428   CO2 25 12/27/2017 1105   CO2 24 03/23/2013 0428   BUN 15 12/27/2017 1105   BUN 11 06/10/2015 0929   BUN 5 (L) 03/23/2013 0428   CREATININE 0.71 12/27/2017 1105   CREATININE 0.75 06/12/2013 0842   GLU 82 10/16/2013      Component Value Date/Time   CALCIUM 8.7 (L) 12/27/2017 1105   CALCIUM 7.7 (L) 03/23/2013 0428   ALKPHOS 51 12/27/2017 1105   ALKPHOS 84 03/20/2013 1843   AST 24 12/27/2017 1105   AST 25 03/20/2013  1843   ALT 23 12/27/2017 1105   ALT 49 03/20/2013 1843   BILITOT 0.4 12/27/2017 1105   BILITOT 0.3 06/10/2015 0929   BILITOT 0.1 (L) 03/20/2013 1843        ASSESSMENT & PLAN:  Iron deficiency anemia due to chronic blood loss # IDA- secondary to gastric bypass/; Hemoglobin is 12.5 today; ferritin -31 sat- 31%; STABLE. HOLD IV iron today.  #Hypocalcemia- new- Ca- 8.6; continue Low vitamin D-continue 50,000 units of vitamin D3  # follow up in 6 months/ labs- few days prior/ferrahem infusion prior.IV Ferrahem today.      Earna CoderGovinda R Koden Hunzeker, MD 01/06/2018 1:50 PM

## 2018-01-06 NOTE — Assessment & Plan Note (Addendum)
#   IDA- secondary to gastric bypass/; Hemoglobin is 12.5 today; ferritin -31 sat- 31%; STABLE. HOLD IV iron today.  #Hypocalcemia- new- Ca- 8.6; continue Low vitamin D-continue 50,000 units of vitamin D3  # follow up in 6 months/ labs- few days prior/ferrahem infusion prior.IV Ferrahem today.

## 2018-01-21 ENCOUNTER — Other Ambulatory Visit: Payer: Self-pay | Admitting: Obstetrics and Gynecology

## 2018-01-21 DIAGNOSIS — Z1231 Encounter for screening mammogram for malignant neoplasm of breast: Secondary | ICD-10-CM

## 2018-02-06 HISTORY — PX: BREAST CYST ASPIRATION: SHX578

## 2018-02-14 ENCOUNTER — Other Ambulatory Visit: Payer: Self-pay | Admitting: Obstetrics and Gynecology

## 2018-02-14 ENCOUNTER — Ambulatory Visit
Admission: RE | Admit: 2018-02-14 | Discharge: 2018-02-14 | Disposition: A | Discharge status: Home / Self Care | Payer: BLUE CROSS/BLUE SHIELD | Source: Ambulatory Visit | Attending: Obstetrics and Gynecology | Admitting: Obstetrics and Gynecology

## 2018-02-14 DIAGNOSIS — N6320 Unspecified lump in the left breast, unspecified quadrant: Secondary | ICD-10-CM

## 2018-02-14 DIAGNOSIS — N6310 Unspecified lump in the right breast, unspecified quadrant: Secondary | ICD-10-CM | POA: Diagnosis present

## 2018-02-14 DIAGNOSIS — Z1231 Encounter for screening mammogram for malignant neoplasm of breast: Secondary | ICD-10-CM | POA: Insufficient documentation

## 2018-02-17 ENCOUNTER — Other Ambulatory Visit: Payer: Self-pay | Admitting: Obstetrics and Gynecology

## 2018-02-17 DIAGNOSIS — N6001 Solitary cyst of right breast: Secondary | ICD-10-CM

## 2018-02-17 DIAGNOSIS — R928 Other abnormal and inconclusive findings on diagnostic imaging of breast: Secondary | ICD-10-CM

## 2018-02-19 ENCOUNTER — Ambulatory Visit
Admission: RE | Admit: 2018-02-19 | Discharge: 2018-02-19 | Disposition: A | Discharge status: Home / Self Care | Payer: BLUE CROSS/BLUE SHIELD | Source: Ambulatory Visit | Attending: Obstetrics and Gynecology | Admitting: Obstetrics and Gynecology

## 2018-02-19 ENCOUNTER — Other Ambulatory Visit: Payer: Self-pay | Admitting: Obstetrics and Gynecology

## 2018-02-19 DIAGNOSIS — N6001 Solitary cyst of right breast: Secondary | ICD-10-CM

## 2018-02-19 DIAGNOSIS — R928 Other abnormal and inconclusive findings on diagnostic imaging of breast: Secondary | ICD-10-CM | POA: Diagnosis present

## 2018-03-19 ENCOUNTER — Other Ambulatory Visit: Payer: Self-pay

## 2018-03-19 ENCOUNTER — Encounter
Admission: RE | Admit: 2018-03-19 | Discharge: 2018-03-19 | Disposition: A | Discharge status: Home / Self Care | Payer: BLUE CROSS/BLUE SHIELD | Source: Ambulatory Visit | Attending: Surgery | Admitting: Surgery

## 2018-03-19 DIAGNOSIS — Z01818 Encounter for other preprocedural examination: Secondary | ICD-10-CM | POA: Diagnosis not present

## 2018-03-19 NOTE — Patient Instructions (Signed)
Your procedure is scheduled on: Thursday 03/27/18  Report to DAY SURGERY DEPARTMENT LOCATED ON 2ND FLOOR MEDICAL MALL ENTRANCE. To find out your arrival time please call [phone] between 1PM - 3PM on Wednesday 03/26/18  Remember: Instructions that are not followed completely may result in serious medical risk, up to and including death, or upon the discretion of your surgeon and anesthesiologist your surgery may need to be rescheduled.     _X__ 1. Do not eat food after midnight the night before your procedure.                 No gum chewing or hard candies. You may drink clear liquids up to 2 hours                 before you are scheduled to arrive for your surgery- DO not drink clear                 liquids within 2 hours of the start of your surgery.                 Clear Liquids include:  water, apple juice without pulp, clear carbohydrate                 drink such as Clearfast or Gatorade, Black Coffee or Tea (Do not add                 anything to coffee or tea).  __X__2.  On the morning of surgery brush your teeth with toothpaste and water, you may rinse your mouth with mouthwash if you wish.  Do not swallow any toothpaste of mouthwash.     _X__ 3.  No Alcohol for 24 hours before or after surgery.   _X__ 4.  Do Not Smoke or use e-cigarettes For 24 Hours Prior to Your Surgery.                 Do not use any chewable tobacco products for at least 6 hours prior to                 surgery.  ____  5.  Bring all medications with you on the day of surgery if instructed.   __X__  6.  Notify your doctor if there is any change in your medical condition      (cold, fever, infections).     Do not wear jewelry, make-up, hairpins, clips or nail polish. Do not wear lotions, powders, or perfumes. NO DEODORANT Do not shave 48 hours prior to surgery. Men may shave face and neck. Do not bring valuables to the hospital.    [facility] is not responsible for any belongings or  valuables.  Contacts, dentures/partials or body piercings may not be worn into surgery. Bring a case for your contacts, glasses or hearing aids, a denture cup will be supplied. Leave your suitcase in the car. After surgery it may be brought to your room. For patients admitted to the hospital, discharge time is determined by your treatment team.   Patients discharged the day of surgery will not be allowed to drive home.   Please read over the following fact sheets that you were given:   MRSA Information  __X__ Take these medicines the morning of surgery with A SIP OF WATER:     1. acetaminophen (TYLENOL) 500 MG tablet  2.   3.   4.  5.  6.    __X__ Use CHG Soap as  directed  __X__ May take Tylenol if needed for pain or discomfort.   __X__ Stop all herbal supplements, fish oil or vitamin E until after surgery.

## 2018-03-26 MED ORDER — CEFAZOLIN SODIUM-DEXTROSE 2-4 GM/100ML-% IV SOLN
2.0000 g | Freq: Once | INTRAVENOUS | Status: AC
  Administered 2018-03-27: 2 g via INTRAVENOUS

## 2018-03-27 ENCOUNTER — Ambulatory Visit: Payer: BLUE CROSS/BLUE SHIELD | Admitting: Anesthesiology

## 2018-03-27 ENCOUNTER — Encounter
Admission: RE | Disposition: A | Discharge status: Home / Self Care | Payer: Self-pay | Source: Ambulatory Visit | Attending: Surgery

## 2018-03-27 ENCOUNTER — Ambulatory Visit
Admission: RE | Admit: 2018-03-27 | Discharge: 2018-03-27 | Disposition: A | Discharge status: Home / Self Care | Payer: BLUE CROSS/BLUE SHIELD | Source: Ambulatory Visit | Attending: Surgery | Admitting: Surgery

## 2018-03-27 DIAGNOSIS — Z9884 Bariatric surgery status: Secondary | ICD-10-CM | POA: Insufficient documentation

## 2018-03-27 DIAGNOSIS — M7521 Bicipital tendinitis, right shoulder: Secondary | ICD-10-CM | POA: Diagnosis not present

## 2018-03-27 DIAGNOSIS — M25811 Other specified joint disorders, right shoulder: Secondary | ICD-10-CM | POA: Insufficient documentation

## 2018-03-27 DIAGNOSIS — M75111 Incomplete rotator cuff tear or rupture of right shoulder, not specified as traumatic: Secondary | ICD-10-CM | POA: Insufficient documentation

## 2018-03-27 DIAGNOSIS — Z79899 Other long term (current) drug therapy: Secondary | ICD-10-CM | POA: Insufficient documentation

## 2018-03-27 DIAGNOSIS — K219 Gastro-esophageal reflux disease without esophagitis: Secondary | ICD-10-CM | POA: Insufficient documentation

## 2018-03-27 DIAGNOSIS — M65811 Other synovitis and tenosynovitis, right shoulder: Secondary | ICD-10-CM | POA: Diagnosis not present

## 2018-03-27 DIAGNOSIS — M25511 Pain in right shoulder: Secondary | ICD-10-CM | POA: Diagnosis present

## 2018-03-27 HISTORY — PX: SHOULDER ARTHROSCOPY WITH OPEN ROTATOR CUFF REPAIR: SHX6092

## 2018-03-27 LAB — POCT PREGNANCY, URINE: Preg Test, Ur: NEGATIVE

## 2018-03-27 SURGERY — ARTHROSCOPY, SHOULDER WITH REPAIR, ROTATOR CUFF, OPEN
Anesthesia: Regional | Laterality: Right

## 2018-03-27 MED ORDER — BUPIVACAINE HCL (PF) 0.25 % IJ SOLN
INTRAMUSCULAR | Status: DC | PRN
  Administered 2018-03-27: 10 mL

## 2018-03-27 MED ORDER — CEFAZOLIN SODIUM-DEXTROSE 2-4 GM/100ML-% IV SOLN
INTRAVENOUS | Status: AC
  Filled 2018-03-27: qty 100

## 2018-03-27 MED ORDER — FENTANYL CITRATE (PF) 100 MCG/2ML IJ SOLN
INTRAMUSCULAR | Status: AC
  Filled 2018-03-27: qty 2

## 2018-03-27 MED ORDER — ONDANSETRON HCL 4 MG/2ML IJ SOLN
INTRAMUSCULAR | Status: AC
  Filled 2018-03-27: qty 2

## 2018-03-27 MED ORDER — MIDAZOLAM HCL 2 MG/2ML IJ SOLN
INTRAMUSCULAR | Status: AC
  Administered 2018-03-27: 2 mg via INTRAVENOUS
  Filled 2018-03-27: qty 2

## 2018-03-27 MED ORDER — FAMOTIDINE 20 MG PO TABS
ORAL_TABLET | ORAL | Status: AC
  Administered 2018-03-27: 20 mg via ORAL
  Filled 2018-03-27: qty 1

## 2018-03-27 MED ORDER — ONDANSETRON HCL 4 MG/2ML IJ SOLN: 4.0000 mg | Freq: Four times a day (QID) | INTRAMUSCULAR | Status: DC | PRN

## 2018-03-27 MED ORDER — EPHEDRINE SULFATE 50 MG/ML IJ SOLN
INTRAMUSCULAR | Status: DC | PRN
  Administered 2018-03-27: 10 mg via INTRAVENOUS

## 2018-03-27 MED ORDER — SUGAMMADEX SODIUM 200 MG/2ML IV SOLN
INTRAVENOUS | Status: AC
  Filled 2018-03-27: qty 2

## 2018-03-27 MED ORDER — EPINEPHRINE 30 MG/30ML IJ SOLN
INTRAMUSCULAR | Status: AC
  Filled 2018-03-27: qty 1

## 2018-03-27 MED ORDER — MEPERIDINE HCL 50 MG/ML IJ SOLN: 6.2500 mg | INTRAMUSCULAR | Status: DC | PRN

## 2018-03-27 MED ORDER — ACETAMINOPHEN 160 MG/5ML PO SOLN
325.0000 mg | ORAL | Status: DC | PRN
  Filled 2018-03-27: qty 20.3

## 2018-03-27 MED ORDER — FENTANYL CITRATE (PF) 100 MCG/2ML IJ SOLN
INTRAMUSCULAR | Status: AC
  Administered 2018-03-27: 100 ug via INTRAVENOUS
  Filled 2018-03-27: qty 2

## 2018-03-27 MED ORDER — SUGAMMADEX SODIUM 200 MG/2ML IV SOLN
INTRAVENOUS | Status: DC | PRN
  Administered 2018-03-27: 150 mg via INTRAVENOUS

## 2018-03-27 MED ORDER — METOCLOPRAMIDE HCL 5 MG/ML IJ SOLN: 5.0000 mg | Freq: Three times a day (TID) | INTRAMUSCULAR | Status: DC | PRN

## 2018-03-27 MED ORDER — FENTANYL CITRATE (PF) 100 MCG/2ML IJ SOLN
INTRAMUSCULAR | Status: DC | PRN
  Administered 2018-03-27 (×2): 50 ug via INTRAVENOUS

## 2018-03-27 MED ORDER — LACTATED RINGERS IV SOLN
INTRAVENOUS | Status: DC
  Administered 2018-03-27: 11:00:00 via INTRAVENOUS

## 2018-03-27 MED ORDER — LIDOCAINE HCL (PF) 1 % IJ SOLN
INTRAMUSCULAR | Status: AC
  Filled 2018-03-27: qty 5

## 2018-03-27 MED ORDER — FENTANYL CITRATE (PF) 100 MCG/2ML IJ SOLN: 25.0000 ug | INTRAMUSCULAR | Status: DC | PRN

## 2018-03-27 MED ORDER — ACETAMINOPHEN 325 MG PO TABS: 325.0000 mg | ORAL_TABLET | ORAL | Status: DC | PRN

## 2018-03-27 MED ORDER — PROPOFOL 10 MG/ML IV BOLUS
INTRAVENOUS | Status: AC
  Filled 2018-03-27: qty 20

## 2018-03-27 MED ORDER — MIDAZOLAM HCL 2 MG/2ML IJ SOLN
2.0000 mg | Freq: Once | INTRAMUSCULAR | Status: AC
  Administered 2018-03-27: 2 mg via INTRAVENOUS

## 2018-03-27 MED ORDER — PROPOFOL 10 MG/ML IV BOLUS
INTRAVENOUS | Status: DC | PRN
  Administered 2018-03-27: 150 mg via INTRAVENOUS

## 2018-03-27 MED ORDER — HYDROCODONE-ACETAMINOPHEN 7.5-325 MG PO TABS
1.0000 | ORAL_TABLET | Freq: Once | ORAL | Status: DC | PRN
  Filled 2018-03-27: qty 1

## 2018-03-27 MED ORDER — METOCLOPRAMIDE HCL 10 MG PO TABS: 5.0000 mg | ORAL_TABLET | Freq: Three times a day (TID) | ORAL | Status: DC | PRN

## 2018-03-27 MED ORDER — BUPIVACAINE-EPINEPHRINE (PF) 0.5% -1:200000 IJ SOLN
INTRAMUSCULAR | Status: AC
  Filled 2018-03-27: qty 30

## 2018-03-27 MED ORDER — DEXAMETHASONE SODIUM PHOSPHATE 10 MG/ML IJ SOLN
INTRAMUSCULAR | Status: DC | PRN
  Administered 2018-03-27: 6 mg via INTRAVENOUS

## 2018-03-27 MED ORDER — OXYCODONE HCL 5 MG PO TABS: 5.0000 mg | ORAL_TABLET | ORAL | Status: DC | PRN

## 2018-03-27 MED ORDER — BUPIVACAINE LIPOSOME 1.3 % IJ SUSP
INTRAMUSCULAR | Status: DC | PRN
  Administered 2018-03-27: 20 mL

## 2018-03-27 MED ORDER — ROCURONIUM BROMIDE 100 MG/10ML IV SOLN
INTRAVENOUS | Status: DC | PRN
  Administered 2018-03-27: 50 mg via INTRAVENOUS

## 2018-03-27 MED ORDER — BUPIVACAINE LIPOSOME 1.3 % IJ SUSP
INTRAMUSCULAR | Status: AC
  Filled 2018-03-27: qty 20

## 2018-03-27 MED ORDER — PROMETHAZINE HCL 25 MG/ML IJ SOLN: 6.2500 mg | INTRAMUSCULAR | Status: DC | PRN

## 2018-03-27 MED ORDER — BUPIVACAINE HCL (PF) 0.5 % IJ SOLN
INTRAMUSCULAR | Status: AC
  Filled 2018-03-27: qty 10

## 2018-03-27 MED ORDER — OXYCODONE HCL 5 MG PO TABS: 5.0000 mg | ORAL_TABLET | ORAL | 0 refills | Status: DC | PRN

## 2018-03-27 MED ORDER — BUPIVACAINE-EPINEPHRINE 0.5% -1:200000 IJ SOLN
INTRAMUSCULAR | Status: DC | PRN
  Administered 2018-03-27: 30 mL

## 2018-03-27 MED ORDER — LIDOCAINE HCL (CARDIAC) PF 100 MG/5ML IV SOSY
PREFILLED_SYRINGE | INTRAVENOUS | Status: DC | PRN
  Administered 2018-03-27: 100 mg via INTRAVENOUS

## 2018-03-27 MED ORDER — POTASSIUM CHLORIDE IN NACL 20-0.9 MEQ/L-% IV SOLN
INTRAVENOUS | Status: DC
  Filled 2018-03-27: qty 1000

## 2018-03-27 MED ORDER — FAMOTIDINE 20 MG PO TABS
20.0000 mg | ORAL_TABLET | Freq: Once | ORAL | Status: AC
  Administered 2018-03-27: 20 mg via ORAL

## 2018-03-27 MED ORDER — FENTANYL CITRATE (PF) 100 MCG/2ML IJ SOLN
100.0000 ug | Freq: Once | INTRAMUSCULAR | Status: AC
Start: 2018-03-27 — End: 2018-03-27
  Administered 2018-03-27: 100 ug via INTRAVENOUS

## 2018-03-27 MED ORDER — ONDANSETRON HCL 4 MG/2ML IJ SOLN
INTRAMUSCULAR | Status: DC | PRN
  Administered 2018-03-27: 4 mg via INTRAVENOUS

## 2018-03-27 MED ORDER — ONDANSETRON HCL 4 MG PO TABS: 4.0000 mg | ORAL_TABLET | Freq: Four times a day (QID) | ORAL | Status: DC | PRN

## 2018-03-27 SURGICAL SUPPLY — 44 items
ANCHOR JUGGERKNOT WTAP NDL 2.9 (Anchor) ×6 IMPLANT
ANCHOR SUT QUATTRO KNTLS 4.5 (Anchor) ×3 IMPLANT
BIT DRILL JUGRKNT W/NDL BIT2.9 (DRILL) ×1 IMPLANT
BLADE FULL RADIUS 3.5 (BLADE) ×3 IMPLANT
BUR ACROMIONIZER 4.0 (BURR) ×3 IMPLANT
CANNULA SHAVER 8MMX76MM (CANNULA) ×3 IMPLANT
CHLORAPREP W/TINT 26ML (MISCELLANEOUS) ×3 IMPLANT
COVER MAYO STAND STRL (DRAPES) ×3 IMPLANT
DRAPE IMP U-DRAPE 54X76 (DRAPES) ×6 IMPLANT
DRILL JUGGERKNOT W/NDL BIT 2.9 (DRILL) ×3
ELECT REM PT RETURN 9FT ADLT (ELECTROSURGICAL) ×3
ELECTRODE REM PT RTRN 9FT ADLT (ELECTROSURGICAL) ×1 IMPLANT
GAUZE PETRO XEROFOAM 1X8 (MISCELLANEOUS) ×3 IMPLANT
GAUZE SPONGE 4X4 12PLY STRL (GAUZE/BANDAGES/DRESSINGS) ×3 IMPLANT
GLOVE BIO SURGEON STRL SZ7.5 (GLOVE) ×6 IMPLANT
GLOVE BIO SURGEON STRL SZ8 (GLOVE) ×6 IMPLANT
GLOVE BIOGEL PI IND STRL 8 (GLOVE) ×1 IMPLANT
GLOVE BIOGEL PI INDICATOR 8 (GLOVE) ×2
GLOVE INDICATOR 8.0 STRL GRN (GLOVE) ×3 IMPLANT
GOWN STRL REUS W/ TWL LRG LVL3 (GOWN DISPOSABLE) ×1 IMPLANT
GOWN STRL REUS W/ TWL XL LVL3 (GOWN DISPOSABLE) ×1 IMPLANT
GOWN STRL REUS W/TWL LRG LVL3 (GOWN DISPOSABLE) ×2
GOWN STRL REUS W/TWL XL LVL3 (GOWN DISPOSABLE) ×2
GRASPER SUT 15 45D LOW PRO (SUTURE) IMPLANT
IV LACTATED RINGER IRRG 3000ML (IV SOLUTION) ×4
IV LR IRRIG 3000ML ARTHROMATIC (IV SOLUTION) ×2 IMPLANT
MANIFOLD NEPTUNE II (INSTRUMENTS) ×3 IMPLANT
MASK FACE SPIDER DISP (MASK) ×3 IMPLANT
MAT ABSORB  FLUID 56X50 GRAY (MISCELLANEOUS) ×2
MAT ABSORB FLUID 56X50 GRAY (MISCELLANEOUS) ×1 IMPLANT
PACK ARTHROSCOPY SHOULDER (MISCELLANEOUS) ×3 IMPLANT
SLING ARM LRG DEEP (SOFTGOODS) IMPLANT
SLING ULTRA II LG (MISCELLANEOUS) IMPLANT
SLING ULTRA II M (MISCELLANEOUS) ×3 IMPLANT
STAPLER SKIN PROX 35W (STAPLE) ×3 IMPLANT
STRAP SAFETY 5IN WIDE (MISCELLANEOUS) ×3 IMPLANT
SUT ETHIBOND 0 MO6 C/R (SUTURE) ×3 IMPLANT
SUT VIC AB 2-0 CT1 27 (SUTURE) ×4
SUT VIC AB 2-0 CT1 TAPERPNT 27 (SUTURE) ×2 IMPLANT
TAPE MICROFOAM 4IN (TAPE) ×3 IMPLANT
TUBING ARTHRO INFLOW-ONLY STRL (TUBING) ×3 IMPLANT
TUBING CONNECTING 10 (TUBING) ×2 IMPLANT
TUBING CONNECTING 10' (TUBING) ×1
WAND HAND CNTRL MULTIVAC 90 (MISCELLANEOUS) ×3 IMPLANT

## 2018-03-27 NOTE — Anesthesia Procedure Notes (Signed)
Anesthesia Regional Block: Interscalene brachial plexus block   Pre-Anesthetic Checklist: ,, timeout performed, Correct Patient, Correct Site, Correct Laterality, Correct Procedure, Correct Position, site marked, Risks and benefits discussed,  Surgical consent,  Pre-op evaluation,  At surgeon's request and post-op pain management  Laterality: Right  Prep: chloraprep       Needles:  Injection technique: Single-shot  Needle Type: Echogenic Needle     Needle Length: 10cm  Needle Gauge: 21     Additional Needles:   Procedures: Doppler guided,,,, ultrasound used (permanent image in chart),,,,  Narrative:  Start time: 03/27/2018 10:08 AM End time: 03/27/2018 10:13 AM Injection made incrementally with aspirations every 5 mL.  Performed by: Personally  Anesthesiologist: Christia ReadingHowell, Laura Kijowski T, MD  Additional Notes: Tolerated well w/ O2/sedation. Good anesthetic spread

## 2018-03-27 NOTE — Transfer of Care (Signed)
Immediate Anesthesia Transfer of Care Note  Patient: Laura Murray  Procedure(s) Performed: SHOULDER ARTHROSCOPY WITH OPEN ROTATOR CUFF REPAIR (Right )  Patient Location: PACU  Anesthesia Type:GA combined with regional for post-op pain  Level of Consciousness: sedated  Airway & Oxygen Therapy: Patient Spontanous Breathing and Patient connected to face mask oxygen  Post-op Assessment: Report given to RN and Post -op Vital signs reviewed and stable  Post vital signs: Reviewed  Last Vitals:  Vitals Value Taken Time  BP 124/79 03/27/2018 12:43 PM  Temp 36.9 C 03/27/2018 12:43 PM  Pulse    Resp 18 03/27/2018 12:43 PM  SpO2 100 % 03/27/2018 12:43 PM    Last Pain:  Vitals:   03/27/18 1024  TempSrc: Tympanic  PainSc: 3          Complications: No apparent anesthesia complications

## 2018-03-27 NOTE — Anesthesia Procedure Notes (Signed)
Procedure Name: Intubation Date/Time: 03/27/2018 11:11 AM Performed by: Philbert Riser, CRNA Pre-anesthesia Checklist: Patient identified, Emergency Drugs available, Suction available, Patient being monitored and Timeout performed Patient Re-evaluated:Patient Re-evaluated prior to induction Oxygen Delivery Method: Circle system utilized and Simple face mask Preoxygenation: Pre-oxygenation with 100% oxygen Induction Type: IV induction Ventilation: Mask ventilation without difficulty Laryngoscope Size: Mac and 3 Grade View: Grade II Tube type: Oral Tube size: 7.0 mm Number of attempts: 1 Airway Equipment and Method: Stylet Placement Confirmation: ETT inserted through vocal cords under direct vision,  positive ETCO2 and breath sounds checked- equal and bilateral Secured at: 20 cm Tube secured with: Tape

## 2018-03-27 NOTE — H&P (Signed)
Paper H&P to be scanned into permanent record. H&P reviewed and patient re-examined. No changes. 

## 2018-03-27 NOTE — Anesthesia Post-op Follow-up Note (Signed)
Anesthesia QCDR form completed.        

## 2018-03-27 NOTE — Anesthesia Postprocedure Evaluation (Signed)
Anesthesia Post Note  Patient: Laura Murray  Procedure(s) Performed: SHOULDER ARTHROSCOPY WITH OPEN ROTATOR CUFF REPAIR (Right )  Patient location during evaluation: PACU Anesthesia Type: Regional and General Level of consciousness: awake and alert Pain management: pain level controlled (Excellent block function) Vital Signs Assessment: post-procedure vital signs reviewed and stable Respiratory status: spontaneous breathing, nonlabored ventilation and respiratory function stable Cardiovascular status: blood pressure returned to baseline and stable Postop Assessment: no apparent nausea or vomiting Anesthetic complications: no     Last Vitals:  Vitals:   03/27/18 1313 03/27/18 1330  BP: (!) 155/82 (!) 144/76  Pulse: 67 65  Resp: 18 18  Temp:  37.2 C  SpO2: 100% 100%    Last Pain:  Vitals:   03/27/18 1330  TempSrc:   PainSc: 0-No pain                 Christia ReadingScott T Treina Arscott

## 2018-03-27 NOTE — Op Note (Signed)
03/27/2018  12:47 PM  Patient:   Laura Murray  Pre-Op Diagnosis:   Nontraumatic incomplete tear of right rotator cuff, right shoulder.  Post-Op Diagnosis:   Impingement/tendinopathy with nontraumatic partial-thickness rotator cuff tear and biceps tendinopathy, right shoulder.  Procedure:   Limited arthroscopic debridement, arthroscopic subacromial decompression, mini-open rotator cuff repair, and mini-open biceps tenodesis, right shoulder.  Anesthesia:   General endotracheal with interscalene block utilizing Exparel placed preoperatively by the anesthesiologist.  Surgeon:   Maryagnes AmosJ. Jeffrey Poggi, MD  Assistant:   Horris LatinoLance McGhee, PA-C  Findings:   As above.  There was a near full-thickness tear involving the anterior insertional fibers of the supraspinatus tendon measuring approximately 0.6 x 1 cm.  The remainder of the rotator cuff was in excellent condition.  There was "lip sticking" on the biceps tendon without partial or full-thickness tearing.  There was some mild fraying of the anterior superior aspect of the labrum without frank detachment from the glenoid.  There also was moderate synovitis in the anterosuperior joint region.  The articular surfaces of both the glenoid and humerus were in excellent condition.  Complications:   None  Fluids:   500 cc  Estimated blood loss:   5 cc  Tourniquet time:   None  Drains:   None  Closure:   Staples      Brief clinical note:   The patient is a 52 year old female with a history of right shoulder pain. The patient's symptoms have progressed despite medications, activity modification, etc. The patient's history and examination are consistent with impingement/tendinopathy with a rotator cuff tear. These findings were confirmed by MRI scan. The patient presents at this time for definitive management of these shoulder symptoms.  Procedure:   The patient underwent placement of an interscalene block using Exparel by the anesthesiologist in the  preoperative holding area before being brought into the operating room and lain in the supine position. The patient then underwent general endotracheal intubation and anesthesia before being repositioned in the beach chair position using the beach chair positioner. The right shoulder and upper extremity were prepped with ChloraPrep solution before being draped sterilely. Preoperative antibiotics were administered. A timeout was performed to confirm the proper surgical site before the expected portal sites and incision site were injected with 0.5% Sensorcaine with epinephrine. A posterior portal was created and the glenohumeral joint thoroughly inspected with the findings as described above. An anterior portal was created using an outside-in technique. The labrum and rotator cuff were further probed, again confirming the above-noted findings. The areas of labral fraying were debrided using the full-radius resector, as were the areas of synovitis. The frayed portion of the rotator cuff tear also was debrided using the full-radius resector. The ArthroCare wand was inserted and used to release the biceps tendon from its labral anchor. This device also was used to obtain hemostasis as well as to "anneal" the labrum superiorly and anteriorly. The instruments were removed from the joint after suctioning the excess fluid.  The camera was repositioned through the posterior portal into the subacromial space. A separate lateral portal was created using an outside-in technique. The 3.5 mm full-radius resector was introduced and used to perform a subtotal bursectomy. The ArthroCare wand was then inserted and used to remove the periosteal tissue off the undersurface of the anterior third of the acromion as well as to recess the coracoacromial ligament from its attachment along the anterior and lateral margins of the acromion. The 4.0 mm acromionizing bur was introduced and  used to complete the decompression by removing the  undersurface of the anterior third of the acromion. The full radius resector was reintroduced to remove any residual bony debris before the ArthroCare wand was reintroduced to obtain hemostasis. The instruments were then removed from the subacromial space after suctioning the excess fluid.  An approximately 4-5 cm incision was made over the anterolateral aspect of the shoulder beginning at the anterolateral corner of the acromion and extending distally in line with the bicipital groove. This incision was carried down through the subcutaneous tissues to expose the deltoid fascia. The raphae between the anterior and middle thirds was identified and this plane developed to provide access into the subacromial space. Additional bursal tissues were debrided sharply using Metzenbaum scissors. The rotator cuff tear was readily identified. The margins were debrided sharply with a #15 blade and the exposed greater tuberosity roughened with a rongeur. The tear was repaired using a single Biomet 2.9 mm JuggerKnot anchor. These sutures were then brought back laterally and secured using one [place] QuatroLink anchor to create a two-layer closure. An apparent watertight closure was obtained.  The bicipital groove was identified by palpation and opened for 1-1.5 cm. The biceps tendon stump was retrieved through this defect. The floor of the bicipital groove was roughened with a curet before another Biomet 2.9 mm JuggerKnot anchor was inserted. Both sets of sutures were passed through the biceps tendon and tied securely to effect the tenodesis. The bicipital sheath was reapproximated using two #0 Ethibond interrupted sutures, incorporating the biceps tendon to further reinforce the tenodesis.  The wound was copiously irrigated with sterile saline solution before the deltoid raphae was reapproximated using 2-0 Vicryl interrupted sutures. The subcutaneous tissues were closed in two layers using 2-0 Vicryl interrupted sutures  before the skin was closed using staples. The portal sites also were closed using staples. A sterile bulky dressing was applied to the shoulder before the arm was placed into a shoulder immobilizer. The patient was then awakened, extubated, and returned to the recovery room in satisfactory condition after tolerating the procedure well.

## 2018-03-27 NOTE — Discharge Instructions (Addendum)
Interscalene Nerve Block with Exparel ° °1.  For your surgery you have received an Interscalene Nerve Block with Exparel. °2. Nerve Blocks affect many types of nerves, including nerves that control movement, pain and normal sensation.  You may experience feelings such as numbness, tingling, heaviness, weakness or the inability to move your arm or the feeling or sensation that your arm has "fallen asleep". °3. A nerve block with Exparel can last up to 5 days.  Usually the weakness wears off first.  The tingling and heaviness usually wear off next.  Finally you may start to notice pain.  Keep in mind that this may occur in any order.  Once a nerve block starts to wear off it is usually completely gone within 60 minutes. °4. ISNB may cause mild shortness of breath, a hoarse voice, blurry vision, unequal pupils, or drooping of the face on the same side as the nerve block.  These symptoms will usually resolve with the numbness.  Very rarely the procedure itself can cause mild seizures. °5. If needed, your surgeon will give you a prescription for pain medication.  It will take about 60 minutes for the oral pain medication to become fully effective.  So, it is recommended that you start taking this medication before the nerve block first begins to wear off, or when you first begin to feel discomfort. °6. Take your pain medication only as prescribed.  Pain medication can cause sedation and decrease your breathing if you take more than you need for the level of pain that you have. °7. Nausea is a common side effect of many pain medications.  You may want to eat something before taking your pain medicine to prevent nausea. °8. After an Interscalene nerve block, you cannot feel pain, pressure or extremes in temperature in the effected arm.  Because your arm is numb it is at an increased risk for injury.  To decrease the possibility of injury, please practice the following: ° °a. While you are awake change the position of  your arm frequently to prevent too much pressure on any one area for prolonged periods of time. °b.  If you have a cast or tight dressing, check the color or your fingers every couple of hours.  Call your surgeon with the appearance of any discoloration (white or blue). °c. If you are given a sling to wear before you go home, please wear it  at all times until the block has completely worn off.  Do not get up at night without your sling. °d. Please contact [facility] or your surgeon if you do not begin to regain sensation after 7 days from the surgery.  Anesthesia may be contacted by calling the Same Day Surgery Department, Mon. through Fri., 6 am to 4 pm at [phone].   °e. If you experience any other problems or concerns, please contact your surgeon's office. °If you experience severe or prolonged shortness of breath go to the nearest emergency department. ° ° °Orthopedic discharge instructions: °Keep dressing dry and intact.  °May shower after dressing changed on post-op day #4 (Monday).  °Cover staples with Band-Aids after drying off. °Apply ice frequently to shoulder. °Take oxycodone as prescribed when needed.  °May supplement with ES Tylenol if necessary. °Keep shoulder immobilizer on at all times except may remove for bathing purposes. °Follow-up in 10-14 days or as scheduled. ° °AMBULATORY SURGERY  °DISCHARGE INSTRUCTIONS ° ° °1) The drugs that you were given will stay in your system until tomorrow   so for the next 24 hours you should not: ° °A) Drive an automobile °B) Make any legal decisions °C) Drink any alcoholic beverage ° ° °2) You may resume regular meals tomorrow.  Today it is better to start with liquids and gradually work up to solid foods. ° °You may eat anything you prefer, but it is better to start with liquids, then soup and crackers, and gradually work up to solid foods. ° ° °3) Please notify your doctor immediately if you have any unusual bleeding, trouble breathing, redness and pain  at the surgery site, drainage, fever, or pain not relieved by medication. ° ° ° °4) Additional Instructions: ° ° ° ° ° ° ° °Please contact your physician with any problems or Same Day Surgery at [phone], Monday through Friday 6 am to 4 pm, or [place] [facility] [place] Main number at [phone]. °

## 2018-03-27 NOTE — Anesthesia Preprocedure Evaluation (Addendum)
Anesthesia Evaluation  Patient identified by MRN, date of birth, ID band Patient awake    Reviewed: Allergy & Precautions, H&P , NPO status , reviewed documented beta blocker date and time   Airway Mallampati: II  TM Distance: >3 FB Neck ROM: full    Dental  (+) Teeth Intact, Caps   Pulmonary    Pulmonary exam normal        Cardiovascular Normal cardiovascular exam     Neuro/Psych  Neuromuscular disease    GI/Hepatic GERD  Medicated and Controlled,  Endo/Other    Renal/GU      Musculoskeletal   Abdominal   Peds  Hematology  (+) anemia ,   Anesthesia Other Findings Past Medical History: No date: Anemia No date: Insomnia No date: Obesity No date: Ovarian cyst 2015: Upper GI bleeding     Comment:  gastric ulcer Past Surgical History: 02/02/2014: BREAST CYST ASPIRATION; Left     Comment:  Dr Lemar LivingsByrnett 2009: CHOLECYSTECTOMY 2014: COLONOSCOPY 2006, 2015: GASTRIC BYPASS     Comment:  2015 revision 2015: GASTRIC RESECTION No date: TONSILLECTOMY No date: TUBAL LIGATION No date: UPPER GI ENDOSCOPY   Reproductive/Obstetrics                            Anesthesia Physical Anesthesia Plan  ASA: II  Anesthesia Plan: General ETT   Post-op Pain Management:  Regional for Post-op pain   Induction: Intravenous  PONV Risk Score and Plan: Ondansetron, Treatment may vary due to age or medical condition, Midazolam, Dexamethasone and Metaclopromide  Airway Management Planned: Oral ETT  Additional Equipment:   Intra-op Plan:   Post-operative Plan: Extubation in OR  Informed Consent: I have reviewed the patients History and Physical, chart, labs and discussed the procedure including the risks, benefits and alternatives for the proposed anesthesia with the patient or authorized representative who has indicated his/her understanding and acceptance.   Dental Advisory Given  Plan Discussed  with: CRNA  Anesthesia Plan Comments:         Anesthesia Quick Evaluation

## 2018-03-28 ENCOUNTER — Encounter: Payer: Self-pay | Admitting: Surgery

## 2018-06-02 ENCOUNTER — Other Ambulatory Visit: Payer: Self-pay | Admitting: Obstetrics and Gynecology

## 2018-06-02 DIAGNOSIS — N6311 Unspecified lump in the right breast, upper outer quadrant: Secondary | ICD-10-CM

## 2018-06-13 ENCOUNTER — Ambulatory Visit
Admission: RE | Admit: 2018-06-13 | Discharge: 2018-06-13 | Disposition: A | Discharge status: Home / Self Care | Payer: BLUE CROSS/BLUE SHIELD | Source: Ambulatory Visit | Attending: Obstetrics and Gynecology | Admitting: Obstetrics and Gynecology

## 2018-06-13 DIAGNOSIS — N6311 Unspecified lump in the right breast, upper outer quadrant: Secondary | ICD-10-CM

## 2018-06-17 ENCOUNTER — Other Ambulatory Visit: Payer: Self-pay

## 2018-06-20 ENCOUNTER — Inpatient Hospital Stay: Payer: BLUE CROSS/BLUE SHIELD | Attending: Internal Medicine

## 2018-06-20 DIAGNOSIS — D500 Iron deficiency anemia secondary to blood loss (chronic): Secondary | ICD-10-CM | POA: Insufficient documentation

## 2018-06-20 DIAGNOSIS — Z79899 Other long term (current) drug therapy: Secondary | ICD-10-CM | POA: Diagnosis not present

## 2018-06-20 DIAGNOSIS — Z9884 Bariatric surgery status: Secondary | ICD-10-CM | POA: Diagnosis not present

## 2018-06-20 DIAGNOSIS — N920 Excessive and frequent menstruation with regular cycle: Secondary | ICD-10-CM | POA: Diagnosis not present

## 2018-06-20 LAB — COMPREHENSIVE METABOLIC PANEL
ALT: 21 U/L (ref 0–44)
ANION GAP: 7 (ref 5–15)
AST: 23 U/L (ref 15–41)
Albumin: 3.8 g/dL (ref 3.5–5.0)
Alkaline Phosphatase: 64 U/L (ref 38–126)
BUN: 16 mg/dL (ref 6–20)
CALCIUM: 8.5 mg/dL — AB (ref 8.9–10.3)
CO2: 25 mmol/L (ref 22–32)
Chloride: 107 mmol/L (ref 98–111)
Creatinine, Ser: 0.97 mg/dL (ref 0.44–1.00)
GFR calc non Af Amer: >60 mL/min (ref >60)
Glucose, Bld: 104 mg/dL — ABNORMAL HIGH (ref 70–99)
Potassium: 3.9 mmol/L (ref 3.5–5.1)
SODIUM: 139 mmol/L (ref 135–145)
Total Bilirubin: 0.5 mg/dL (ref 0.3–1.2)
Total Protein: 7.2 g/dL (ref 6.5–8.1)

## 2018-06-20 LAB — CBC WITH DIFFERENTIAL/PLATELET
Abs Immature Granulocytes: 0.01 10*3/uL (ref 0.00–0.07)
BASOS ABS: 0 10*3/uL (ref 0.0–0.1)
Basophils Relative: 0 %
EOS ABS: 0.1 10*3/uL (ref 0.0–0.5)
Eosinophils Relative: 2 %
HEMATOCRIT: 34.4 % — AB (ref 36.0–46.0)
Hemoglobin: 11.4 g/dL — ABNORMAL LOW (ref 12.0–15.0)
IMMATURE GRANULOCYTES: 0 %
LYMPHS ABS: 1.1 10*3/uL (ref 0.7–4.0)
LYMPHS PCT: 24 %
MCH: 30.8 pg (ref 26.0–34.0)
MCHC: 33.1 g/dL (ref 30.0–36.0)
MCV: 93 fL (ref 80.0–100.0)
Monocytes Absolute: 0.4 10*3/uL (ref 0.1–1.0)
Monocytes Relative: 7 %
NEUTROS PCT: 67 %
NRBC: 0 % (ref 0.0–0.2)
Neutro Abs: 3.2 10*3/uL (ref 1.7–7.7)
Platelets: 241 10*3/uL (ref 150–400)
RBC: 3.7 MIL/uL — ABNORMAL LOW (ref 3.87–5.11)
RDW: 12.4 % (ref 11.5–15.5)
WBC: 4.7 10*3/uL (ref 4.0–10.5)

## 2018-06-20 LAB — IRON AND TIBC
IRON: 83 ug/dL (ref 28–170)
SATURATION RATIOS: 19 % (ref 10.4–31.8)
TIBC: 441 ug/dL (ref 250–450)
UIBC: 358 ug/dL

## 2018-06-20 LAB — FERRITIN: Ferritin: 21 ng/mL (ref 11–307)

## 2018-06-27 ENCOUNTER — Inpatient Hospital Stay: Payer: BLUE CROSS/BLUE SHIELD

## 2018-06-27 ENCOUNTER — Encounter: Payer: Self-pay | Admitting: Internal Medicine

## 2018-06-27 ENCOUNTER — Inpatient Hospital Stay (HOSPITAL_BASED_OUTPATIENT_CLINIC_OR_DEPARTMENT_OTHER): Payer: BLUE CROSS/BLUE SHIELD | Admitting: Internal Medicine

## 2018-06-27 VITALS — BP 132/70 | HR 61 | Temp 96.7°F | Resp 16 | Wt 144.0 lb

## 2018-06-27 DIAGNOSIS — D500 Iron deficiency anemia secondary to blood loss (chronic): Secondary | ICD-10-CM

## 2018-06-27 DIAGNOSIS — N920 Excessive and frequent menstruation with regular cycle: Secondary | ICD-10-CM

## 2018-06-27 DIAGNOSIS — Z79899 Other long term (current) drug therapy: Secondary | ICD-10-CM

## 2018-06-27 DIAGNOSIS — Z9884 Bariatric surgery status: Secondary | ICD-10-CM | POA: Diagnosis not present

## 2018-06-27 NOTE — Patient Instructions (Addendum)
#  Recommend calcium pill 500 once a day. #Recommend talking to PCP regarding referral to a rheumatologist regarding raynauds.

## 2018-06-27 NOTE — Assessment & Plan Note (Addendum)
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 HOLD IV iron today.  #Mild hypercalcemia-continue vitamin D supplementation.  Today calcium 8.5.  #Raynard's phenomena-unclear etiology.  Recommend evaluation with PCP/referral to rheumatology.  # DISPOSITION: # NO ferrahem # Follow up in 6 months/labs- cbc; b12 levels/ferritin/iron studies/possible ferrahem-Dr.B  Cc;Dr.[name].

## 2018-06-27 NOTE — Progress Notes (Signed)
[facility] OFFICE PROGRESS NOTE  Patient Care Team: Malva LimesFisher, Donald E, MD as PCP - General (Family Medicine) Marin RobertsGittin, Robert G, MD as Referring Physician (Internal Medicine) Lemar LivingsByrnett, Merrily PewJeffrey W, MD as Consulting Physician (General Surgery) Schermerhorn, Ihor Austinhomas J, MD as Referring Physician (Obstetrics and Gynecology)   SUMMARY OF ONCOLOGIC HISTORY:  # IDA [place][sec to gastric Bypass 2002/ menstrual blood loss]; IV iron q 6 M/ B12 IM [thru PCP]  INTERVAL HISTORY:  A very pleasant 52 year-old female patient with above history of 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   Patient complains of mild fatigue.  Otherwise not any worse.  No nausea no vomiting abdominal pain.  No weight loss.  Patient complains of episodes of whitish discoloration of her fingertips/with burning pain.  Cold.  The last few months.  Denies any joint pains.  Review of Systems  Constitutional: Negative for chills, diaphoresis, fever, malaise/fatigue and weight loss.  HENT: Negative for nosebleeds and sore throat.   Eyes: Negative for double vision.  Respiratory: Negative for cough, hemoptysis, sputum production, shortness of breath and wheezing.   Cardiovascular: Negative for chest pain, palpitations, orthopnea and leg swelling.  Gastrointestinal: Negative for abdominal pain, blood in stool, constipation, diarrhea, heartburn, melena, nausea and vomiting.  Genitourinary: Negative for dysuria, frequency and urgency.  Musculoskeletal: Negative for back pain and joint pain.  Skin: Negative.  Negative for itching and rash.  Neurological: Negative for dizziness, tingling, focal weakness, weakness and headaches.  Endo/Heme/Allergies: Does not bruise/bleed easily.  Psychiatric/Behavioral: Negative for depression. The patient is not nervous/anxious and does not have insomnia.    Marland Kitchen.   PAST MEDICAL HISTORY :  Past Medical History:  Diagnosis Date  . Anemia   .  Insomnia   . Obesity   . Ovarian cyst   . Upper GI bleeding 2015   gastric ulcer    PAST SURGICAL HISTORY :   Past Surgical History:  Procedure Laterality Date  . BREAST CYST ASPIRATION Left 02/02/2014   Dr Lemar LivingsByrnett  . BREAST CYST ASPIRATION Right 02/2018  . CHOLECYSTECTOMY  2009  . COLONOSCOPY  2014  . GASTRIC BYPASS  2006, 2015   2015 revision  . GASTRIC RESECTION  2015  . SHOULDER ARTHROSCOPY WITH OPEN ROTATOR CUFF REPAIR Right 03/27/2018   Procedure: SHOULDER ARTHROSCOPY WITH OPEN ROTATOR CUFF REPAIR;  Surgeon: Christena FlakePoggi, John J, MD;  Location: [facility] ORS;  Service: Orthopedics;  Laterality: Right;  . TONSILLECTOMY    . TUBAL LIGATION    . UPPER GI ENDOSCOPY      FAMILY HISTORY :   Family History  Problem Relation Age of Onset  . Lung cancer Maternal Grandfather   . Cancer Maternal Grandfather   . Diabetes Mother   . Hypertension Mother   . Diabetes Father   . Hypertension Father   . Crohn's disease Son   . Rheum arthritis Son   . Asthma Son   . Cancer Maternal Grandmother   . Breast cancer Maternal Aunt        [id number]  . Colon cancer Maternal Uncle     SOCIAL HISTORY:   Social History   Tobacco Use  . Smoking status: Never Smoker  . Smokeless tobacco: Never Used  Substance Use Topics  . Alcohol use: No  . Drug use: No    ALLERGIES:  is allergic to nsaids.  MEDICATIONS:  Current Outpatient Medications  Medication Sig Dispense Refill  . acetaminophen (TYLENOL) 500 MG tablet Take 1,000  mg by mouth every 4 (four) hours as needed for moderate pain.    Marland Kitchen. amitriptyline (ELAVIL) 50 MG tablet Take 50 mg by mouth at bedtime as needed for sleep.    . CELLULOSE PO Take 1-3 tablets by mouth daily.    . ferumoxytol (FERAHEME) 510 MG/17ML SOLN injection Inject into the vein.    . Magnesium 400 MG TABS Take 400 mg by mouth daily.    Marland Kitchen. oxyCODONE (ROXICODONE) 5 MG immediate release tablet Take 1-2 tablets (5-10 mg total) by mouth every 4 (four) hours as needed. 50 tablet  0  . Probiotic CAPS Take 1 capsule by mouth daily.    . Vitamin D, Ergocalciferol, (DRISDOL) 50000 units CAPS capsule TAKE 1 CAPSULE WEEKLY (Patient taking differently: Take 50,000 Units by mouth every 7 (seven) days. ) 12 capsule 4   No current facility-administered medications for this visit.     PHYSICAL EXAMINATION: ECOG PERFORMANCE STATUS: 0 - Asymptomatic  BP 132/70 (BP Location: Left Arm, Patient Position: Sitting, Cuff Size: Normal)   Pulse 61   Temp (!) 96.7 F (35.9 C) (Tympanic)   Resp 16   Wt 144 lb (65.3 kg)   BMI 24.72 kg/m   Filed Weights   06/27/18 1317  Weight: 144 lb (65.3 kg)    Physical Exam  Constitutional: She is oriented to person, place, and time and well-developed, well-nourished, and in no distress.  HENT:  Head: Normocephalic and atraumatic.  Mouth/Throat: Oropharynx is clear and moist. No oropharyngeal exudate.  Eyes: Pupils are equal, round, and reactive to light.  Neck: Normal range of motion. Neck supple.  Cardiovascular: Normal rate and regular rhythm.  Pulmonary/Chest: No respiratory distress. She has no wheezes.  Abdominal: Soft. Bowel sounds are normal. She exhibits no distension and no mass. There is no abdominal tenderness. There is no rebound and no guarding.  Musculoskeletal: Normal range of motion.        General: No tenderness or edema.  Neurological: She is alert and oriented to person, place, and time.  Skin: Skin is warm.  Psychiatric: Affect normal.    LABORATORY DATA:  I have reviewed the data as listed    Component Value Date/Time   NA 139 06/20/2018 1509   NA 141 06/10/2015 0929   NA 142 03/23/2013 0428   K 3.9 06/20/2018 1509   K 4.1 06/29/2013 0902   CL 107 06/20/2018 1509   CL 112 (H) 03/23/2013 0428   CO2 25 06/20/2018 1509   CO2 24 03/23/2013 0428   GLUCOSE 104 (H) 06/20/2018 1509   GLUCOSE 90 03/23/2013 0428   BUN 16 06/20/2018 1509   BUN 11 06/10/2015 0929   BUN 5 (L) 03/23/2013 0428   CREATININE 0.97  06/20/2018 1509   CREATININE 0.75 06/12/2013 0842   CALCIUM 8.5 (L) 06/20/2018 1509   CALCIUM 7.7 (L) 03/23/2013 0428   PROT 7.2 06/20/2018 1509   PROT 6.6 06/10/2015 0929   PROT 6.1 (L) 03/20/2013 1843   ALBUMIN 3.8 06/20/2018 1509   ALBUMIN 3.9 06/10/2015 0929   ALBUMIN 3.0 (L) 03/20/2013 1843   AST 23 06/20/2018 1509   AST 25 03/20/2013 1843   ALT 21 06/20/2018 1509   ALT 49 03/20/2013 1843   ALKPHOS 64 06/20/2018 1509   ALKPHOS 84 03/20/2013 1843   BILITOT 0.5 06/20/2018 1509   BILITOT 0.3 06/10/2015 0929   BILITOT 0.1 (L) 03/20/2013 1843   GFRNONAA >60 06/20/2018 1509   GFRNONAA >60 06/12/2013 0842   GFRAA >60  06/20/2018 1509   GFRAA >60 06/12/2013 0842    No results found for: SPEP, UPEP  Lab Results  Component Value Date   WBC 4.7 06/20/2018   NEUTROABS 3.2 06/20/2018   HGB 11.4 (L) 06/20/2018   HCT 34.4 (L) 06/20/2018   MCV 93.0 06/20/2018   PLT 241 06/20/2018      Chemistry      Component Value Date/Time   NA 139 06/20/2018 1509   NA 141 06/10/2015 0929   NA 142 03/23/2013 0428   K 3.9 06/20/2018 1509   K 4.1 06/29/2013 0902   CL 107 06/20/2018 1509   CL 112 (H) 03/23/2013 0428   CO2 25 06/20/2018 1509   CO2 24 03/23/2013 0428   BUN 16 06/20/2018 1509   BUN 11 06/10/2015 0929   BUN 5 (L) 03/23/2013 0428   CREATININE 0.97 06/20/2018 1509   CREATININE 0.75 06/12/2013 0842   GLU 82 10/16/2013      Component Value Date/Time   CALCIUM 8.5 (L) 06/20/2018 1509   CALCIUM 7.7 (L) 03/23/2013 0428   ALKPHOS 64 06/20/2018 1509   ALKPHOS 84 03/20/2013 1843   AST 23 06/20/2018 1509   AST 25 03/20/2013 1843   ALT 21 06/20/2018 1509   ALT 49 03/20/2013 1843   BILITOT 0.5 06/20/2018 1509   BILITOT 0.3 06/10/2015 0929   BILITOT 0.1 (L) 03/20/2013 1843        ASSESSMENT & PLAN:  Iron deficiency anemia due to chronic blood loss # 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 HOLD IV iron today.  #Mild  hypercalcemia-continue vitamin D supplementation.  Today calcium 8.5.  #Raynard's phenomena-unclear etiology.  Recommend evaluation with PCP/referral to rheumatology.  # DISPOSITION: # NO ferrahem # Follow up in 6 months/labs- cbc; b12 levels/ferritin/iron studies/possible ferrahem-Dr.B  Cc;Dr.[name].      Earna Coder, MD 06/29/2018 5:39 PM

## 2018-07-04 ENCOUNTER — Ambulatory Visit
Admission: RE | Admit: 2018-07-04 | Discharge: 2018-07-04 | Disposition: A | Discharge status: Home / Self Care | Payer: BLUE CROSS/BLUE SHIELD | Source: Ambulatory Visit | Attending: Surgery | Admitting: Surgery

## 2018-07-04 ENCOUNTER — Other Ambulatory Visit: Payer: Self-pay

## 2018-07-04 NOTE — Patient Instructions (Signed)
Your procedure is scheduled on: Tuesday 07/08/18   Report to DAY SURGERY DEPARTMENT LOCATED ON 2ND FLOOR MEDICAL MALL ENTRANCE. To find out your arrival time please call [phone] between 1PM - 3PM on Monday 07/07/18.   Remember: Instructions that are not followed completely may result in serious medical risk, up to and including death, or upon the discretion of your surgeon and anesthesiologist your surgery may need to be rescheduled.      _X__ 1. Do not eat food after midnight the night before your procedure.                 No gum chewing or hard candies. You may drink clear liquids up to 2 hours                 before you are scheduled to arrive for your surgery- DO NOT drink clear                 liquids within 2 hours of the start of your surgery.                 Clear Liquids include:  water, apple juice without pulp, clear carbohydrate                 drink such as Clearfast or Gatorade, Black Coffee or Tea (Do not add                 anything to coffee or tea).  __X__2.  On the morning of surgery brush your teeth with toothpaste and water, you may rinse your mouth with mouthwash if you wish.  Do not swallow any toothpaste or mouthwash.     _X__ 3.  No Alcohol for 24 hours before or after surgery.   _X__ 4.  Do Not Smoke or use e-cigarettes For 24 Hours Prior to Your Surgery.                 Do not use any chewable tobacco products for at least 6 hours prior to                 surgery.   __X__5.  Notify your doctor if there is any change in your medical condition      (cold, fever, infections).      Do not wear jewelry, make-up, hairpins, clips or nail polish. Do not wear lotions, powders, or perfumes. Do not wear deodorant. Do not shave 48 hours prior to surgery. Men may shave face and neck. Do not bring valuables to the hospital.     [facility] is not responsible for any belongings or valuables.   Contacts, dentures/partials or body piercings may not be worn  into surgery. Bring a case for your contacts, glasses or hearing aids, a denture cup will be supplied.    Patients discharged the day of surgery will not be allowed to drive home.     __X__ Take these medicines the morning of surgery with A SIP OF WATER:     1. acetaminophen (TYLENOL) 500 MG tablet    __X__ Stop Anti-inflammatories 7 days before surgery such as Advil, Ibuprofen, Motrin, BC or Goodies Powder, Naprosyn, Naproxen, Aleve, Aspirin, Meloxicam. May take Tylenol if needed for pain or discomfort.     __X__ Stop the following supplements today:  Magnesium 400 MG TABS, Probiotic CAPS, CELLULOSE PO, Calcium-Magnesium-Vitamin D (CALCIUM 500 PO)

## 2018-07-07 MED ORDER — CEFAZOLIN SODIUM-DEXTROSE 2-4 GM/100ML-% IV SOLN: 2.0000 g | Freq: Once | INTRAVENOUS | Status: DC

## 2018-07-08 ENCOUNTER — Ambulatory Visit: Payer: BLUE CROSS/BLUE SHIELD | Admitting: Anesthesiology

## 2018-07-08 ENCOUNTER — Encounter
Admission: RE | Disposition: A | Discharge status: Home / Self Care | Payer: Self-pay | Source: Ambulatory Visit | Attending: Surgery

## 2018-07-08 ENCOUNTER — Encounter: Payer: Self-pay | Admitting: *Deleted

## 2018-07-08 ENCOUNTER — Other Ambulatory Visit: Payer: Self-pay

## 2018-07-08 ENCOUNTER — Ambulatory Visit
Admission: RE | Admit: 2018-07-08 | Discharge: 2018-07-08 | Disposition: A | Discharge status: Home / Self Care | Payer: BLUE CROSS/BLUE SHIELD | Source: Ambulatory Visit | Attending: Surgery | Admitting: Surgery

## 2018-07-08 DIAGNOSIS — M7501 Adhesive capsulitis of right shoulder: Secondary | ICD-10-CM | POA: Diagnosis present

## 2018-07-08 DIAGNOSIS — Z9884 Bariatric surgery status: Secondary | ICD-10-CM | POA: Diagnosis not present

## 2018-07-08 DIAGNOSIS — Z79899 Other long term (current) drug therapy: Secondary | ICD-10-CM | POA: Diagnosis not present

## 2018-07-08 HISTORY — PX: SHOULDER CLOSED REDUCTION: SHX1051

## 2018-07-08 HISTORY — PX: STERIOD INJECTION: SHX5046

## 2018-07-08 LAB — POCT PREGNANCY, URINE: Preg Test, Ur: NEGATIVE

## 2018-07-08 SURGERY — MANIPULATION, JOINT, SHOULDER, WITH ANESTHESIA
Anesthesia: General | Site: Shoulder | Laterality: Right

## 2018-07-08 MED ORDER — LACTATED RINGERS IV SOLN
INTRAVENOUS | Status: DC
  Administered 2018-07-08: 12:00:00 via INTRAVENOUS

## 2018-07-08 MED ORDER — BUPIVACAINE-EPINEPHRINE (PF) 0.25% -1:200000 IJ SOLN
INTRAMUSCULAR | Status: DC | PRN
  Administered 2018-07-08: 10 mL

## 2018-07-08 MED ORDER — OXYCODONE HCL 5 MG PO TABS: 5.0000 mg | ORAL_TABLET | ORAL | Status: DC | PRN

## 2018-07-08 MED ORDER — PROPOFOL 10 MG/ML IV BOLUS
INTRAVENOUS | Status: DC | PRN
  Administered 2018-07-08: 10 mg via INTRAVENOUS
  Administered 2018-07-08: 100 mg via INTRAVENOUS

## 2018-07-08 MED ORDER — METOCLOPRAMIDE HCL 10 MG PO TABS: 5.0000 mg | ORAL_TABLET | Freq: Three times a day (TID) | ORAL | Status: DC | PRN

## 2018-07-08 MED ORDER — LIDOCAINE HCL (CARDIAC) PF 100 MG/5ML IV SOSY
PREFILLED_SYRINGE | INTRAVENOUS | Status: DC | PRN
  Administered 2018-07-08: 60 mg via INTRAVENOUS

## 2018-07-08 MED ORDER — MIDAZOLAM HCL 2 MG/2ML IJ SOLN
INTRAMUSCULAR | Status: DC | PRN
  Administered 2018-07-08: 2 mg via INTRAVENOUS

## 2018-07-08 MED ORDER — ONDANSETRON HCL 4 MG PO TABS: 4.0000 mg | ORAL_TABLET | Freq: Four times a day (QID) | ORAL | Status: DC | PRN

## 2018-07-08 MED ORDER — POTASSIUM CHLORIDE IN NACL 20-0.9 MEQ/L-% IV SOLN
INTRAVENOUS | Status: DC
  Filled 2018-07-08 (×3): qty 1000

## 2018-07-08 MED ORDER — PROPOFOL 10 MG/ML IV BOLUS
INTRAVENOUS | Status: AC
  Filled 2018-07-08: qty 20

## 2018-07-08 MED ORDER — ONDANSETRON HCL 4 MG/2ML IJ SOLN: 4.0000 mg | Freq: Four times a day (QID) | INTRAMUSCULAR | Status: DC | PRN

## 2018-07-08 MED ORDER — FENTANYL CITRATE (PF) 100 MCG/2ML IJ SOLN: 25.0000 ug | INTRAMUSCULAR | Status: DC | PRN

## 2018-07-08 MED ORDER — FENTANYL CITRATE (PF) 100 MCG/2ML IJ SOLN
INTRAMUSCULAR | Status: DC | PRN
  Administered 2018-07-08: 50 ug via INTRAVENOUS

## 2018-07-08 MED ORDER — FENTANYL CITRATE (PF) 100 MCG/2ML IJ SOLN
INTRAMUSCULAR | Status: AC
  Filled 2018-07-08: qty 2

## 2018-07-08 MED ORDER — TRIAMCINOLONE ACETONIDE 40 MG/ML IJ SUSP
INTRAMUSCULAR | Status: DC | PRN
  Administered 2018-07-08: 40 mg

## 2018-07-08 MED ORDER — MIDAZOLAM HCL 2 MG/2ML IJ SOLN
INTRAMUSCULAR | Status: AC
  Filled 2018-07-08: qty 2

## 2018-07-08 MED ORDER — LIDOCAINE HCL (PF) 2 % IJ SOLN
INTRAMUSCULAR | Status: AC
  Filled 2018-07-08: qty 10

## 2018-07-08 MED ORDER — METOCLOPRAMIDE HCL 5 MG/ML IJ SOLN: 5.0000 mg | Freq: Three times a day (TID) | INTRAMUSCULAR | Status: DC | PRN

## 2018-07-08 SURGICAL SUPPLY — 10 items
BNDG ADH 2 X3.75 FABRIC TAN LF (GAUZE/BANDAGES/DRESSINGS) ×2 IMPLANT
GAUZE SPONGE 4X4 12PLY STRL (GAUZE/BANDAGES/DRESSINGS) ×2 IMPLANT
KIT TURNOVER KIT A (KITS) ×2 IMPLANT
NDL HYPO 21X1.5 SAFETY (NEEDLE) ×1 IMPLANT
NEEDLE HYPO 21X1.5 SAFETY (NEEDLE) ×2 IMPLANT
PAD ALCOHOL SWAB (MISCELLANEOUS) ×4 IMPLANT
SLING ARM M TX990204 (SOFTGOODS) ×2 IMPLANT
SOL PREP PVP 2OZ (MISCELLANEOUS) ×2
SOLUTION PREP PVP 2OZ (MISCELLANEOUS) ×1 IMPLANT
SYR 10ML LL (SYRINGE) ×2 IMPLANT

## 2018-07-08 NOTE — Anesthesia Post-op Follow-up Note (Signed)
Anesthesia QCDR form completed.        

## 2018-07-08 NOTE — H&P (Signed)
Paper H&P to be scanned into permanent record. H&P reviewed and patient re-examined. No changes. 

## 2018-07-08 NOTE — Discharge Instructions (Addendum)
Orthopedic discharge instructions: Use sling as needed for comfort for first 24 to 48 hours, then discontinue. May shower tomorrow morning. Apply ice to affected area frequently. Take pain medication as prescribed or ES Tylenol when needed.  Keep physical therapy appointment on Thursday as scheduled. Return for follow-up in 10-14 days or as scheduled.  AMBULATORY SURGERY  DISCHARGE INSTRUCTIONS   1) The drugs that you were given will stay in your system until tomorrow so for the next 24 hours you should not:  A) Drive an automobile B) Make any legal decisions C) Drink any alcoholic beverage   2) You may resume regular meals tomorrow.  Today it is better to start with liquids and gradually work up to solid foods.  You may eat anything you prefer, but it is better to start with liquids, then soup and crackers, and gradually work up to solid foods.   3) Please notify your doctor immediately if you have any unusual bleeding, trouble breathing, redness and pain at the surgery site, drainage, fever, or pain not relieved by medication.    4) Additional Instructions:        Please contact your physician with any problems or Same Day Surgery at [phone], Monday through Friday 6 am to 4 pm, or [place] at [facility] Main number at [phone].

## 2018-07-08 NOTE — Anesthesia Procedure Notes (Signed)
Date/Time: 07/08/2018 12:26 PM Performed by: Ginger CarneMichelet, Bentlee Drier, CRNA Pre-anesthesia Checklist: Patient identified, Emergency Drugs available, Suction available, Patient being monitored and Timeout performed Patient Re-evaluated:Patient Re-evaluated prior to induction Oxygen Delivery Method: Circle system utilized Preoxygenation: Pre-oxygenation with 100% oxygen Induction Type: IV induction

## 2018-07-08 NOTE — Op Note (Signed)
07/08/2018  12:36 PM  Patient:   Laura Murray  Pre-Op Diagnosis:   Secondary adhesive capsulitis, right shoulder.  Post-Op Diagnosis:   Same  Procedure:   Manipulation under anesthesia with steroid injection, right shoulder.  Surgeon:   Maryagnes AmosJ. Jeffrey Poggi, MD  Assistant:   None  Anesthesia:   IV sedation  Findings:   As above. Prior to manipulation, the right shoulder could be forward flexed to 155 and abducted to 150. At 90 of abduction, the shoulder could be externally rotated to 75 and internally rotated to 55. Following manipulation, the shoulder could be forward flexed to 175, abducted to 170 and, at 90 of abduction, externally rotated to 95 and internally rotated to 70.  Complications:   None  EBL:   0 cc  Fluids:   150 cc crystalloid  TT:   None  Drains:   None  Closure:   None  Brief Clinical Note:   The patient is a 52 year old female who is now 3.5 months status post a right shoulder arthroscopy with debridement, decompression, mini-open rotator cuff repair, and mini-open biceps tenodesis. Despite extensive physical therapy, the patient continues to have difficulty regaining shoulder range of motion. The patient's history and examination are consistent with adhesive capsulitis. The patient presents at this time for a manipulation under anesthesia with steroid injection of the right shoulder.  Procedure:   The patient was brought into the operating room and lain in the supine position. After adequate IV sedation was achieved, a timeout was performed to verify the correct surgical site. The right shoulder was gently manipulated in both abduction and external rotation, as well as adduction and internal rotation. Several palpable and audible pops were heard as the scar tissue released, permitting full range of motion of the shoulder. The glenohumeral joint was injected sterilely using 1 cc of Kenalog-40 and 9 cc of 0.25% Sensorcaine with epinephrine before the  patient was placed into a sling. The patient was then awakened and returned to the recovery room in satisfactory condition after tolerating the procedure well.

## 2018-07-08 NOTE — Anesthesia Preprocedure Evaluation (Signed)
Anesthesia Evaluation  Patient identified by MRN, date of birth, ID band Patient awake    Reviewed: Allergy & Precautions, H&P , NPO status , Patient's Chart, lab work & pertinent test results  Airway Mallampati: II  TM Distance: >3 FB     Dental  (+) Teeth Intact   Pulmonary neg pulmonary ROS, neg COPD,           Cardiovascular (-) angina(-) Past MI and (-) Cardiac Stents negative cardio ROS  (-) dysrhythmias      Neuro/Psych negative neurological ROS  negative psych ROS   GI/Hepatic negative GI ROS, Neg liver ROS,   Endo/Other  negative endocrine ROS  Renal/GU negative Renal ROS  negative genitourinary   Musculoskeletal   Abdominal   Peds  Hematology  (+) Blood dyscrasia, anemia ,   Anesthesia Other Findings Past Medical History: No date: Anemia No date: Insomnia No date: Obesity No date: Ovarian cyst 2015: Upper GI bleeding     Comment:  gastric ulcer  Past Surgical History: 02/02/2014: BREAST CYST ASPIRATION; Left     Comment:  Dr Lemar LivingsByrnett 02/2018: BREAST CYST ASPIRATION; Right 2009: CHOLECYSTECTOMY 2014: COLONOSCOPY 2006, 2015: GASTRIC BYPASS     Comment:  2015 revision 2015: GASTRIC RESECTION 03/27/2018: SHOULDER ARTHROSCOPY WITH OPEN ROTATOR CUFF REPAIR; Right     Comment:  Procedure: SHOULDER ARTHROSCOPY WITH OPEN ROTATOR CUFF               REPAIR;  Surgeon: Christena FlakePoggi, John J, MD;  Location: [facility] ORS;              Service: Orthopedics;  Laterality: Right; No date: TONSILLECTOMY No date: TUBAL LIGATION No date: UPPER GI ENDOSCOPY  BMI    Body Mass Index:  24.89 kg/m      Reproductive/Obstetrics negative OB ROS                             Anesthesia Physical Anesthesia Plan  ASA: I  Anesthesia Plan: General   Post-op Pain Management:    Induction:   PONV Risk Score and Plan:   Airway Management Planned: Natural Airway and Mask  Additional Equipment:    Intra-op Plan:   Post-operative Plan:   Informed Consent: I have reviewed the patients History and Physical, chart, labs and discussed the procedure including the risks, benefits and alternatives for the proposed anesthesia with the patient or authorized representative who has indicated his/her understanding and acceptance.   Dental Advisory Given  Plan Discussed with: Anesthesiologist, CRNA and Surgeon  Anesthesia Plan Comments:         Anesthesia Quick Evaluation

## 2018-07-08 NOTE — Anesthesia Postprocedure Evaluation (Signed)
Anesthesia Post Note  Patient: Laura Murray  Procedure(s) Performed: CLOSED MANIPULATION SHOULDER (Right Shoulder) STEROID INJECTION OF RIGHT SHOULDER (Right Shoulder)  Patient location during evaluation: PACU Anesthesia Type: General Level of consciousness: awake and alert Pain management: pain level controlled Vital Signs Assessment: post-procedure vital signs reviewed and stable Respiratory status: spontaneous breathing, nonlabored ventilation, respiratory function stable and patient connected to nasal cannula oxygen Cardiovascular status: blood pressure returned to baseline and stable Postop Assessment: no apparent nausea or vomiting Anesthetic complications: no     Last Vitals:  Vitals:   07/08/18 1306 07/08/18 1316  BP: (!) 164/90 (!) 161/91  Pulse: 60 63  Resp: 16 13  Temp:  36.8 C  SpO2: 100% 100%    Last Pain:  Vitals:   07/08/18 1316  TempSrc:   PainSc: 0-No pain                 Jovita GammaKathryn L Fitzgerald

## 2018-07-08 NOTE — Transfer of Care (Signed)
Immediate Anesthesia Transfer of Care Note  Patient: Laura RiddleJacqueline C Steier  Procedure(s) Performed: CLOSED MANIPULATION SHOULDER (Right Shoulder) STEROID INJECTION OF RIGHT SHOULDER (Right Shoulder)  Patient Location: PACU  Anesthesia Type:General  Level of Consciousness: sedated  Airway & Oxygen Therapy: Patient Spontanous Breathing and Patient connected to face mask oxygen  Post-op Assessment: Report given to RN and Post -op Vital signs reviewed and stable  Post vital signs: Reviewed and stable  Last Vitals:  Vitals Value Taken Time  BP 145/76 07/08/2018 12:36 PM  Temp 36.1 C 07/08/2018 12:36 PM  Pulse 85 07/08/2018 12:36 PM  Resp 25 07/08/2018 12:36 PM  SpO2 100 % 07/08/2018 12:36 PM    Last Pain:  Vitals:   07/08/18 1152  TempSrc: Tympanic  PainSc: 2          Complications: No apparent anesthesia complications

## 2018-07-09 ENCOUNTER — Encounter: Payer: Self-pay | Admitting: Surgery

## 2018-07-11 ENCOUNTER — Other Ambulatory Visit: Payer: BLUE CROSS/BLUE SHIELD

## 2018-07-14 ENCOUNTER — Ambulatory Visit: Payer: BLUE CROSS/BLUE SHIELD | Admitting: Internal Medicine

## 2018-07-14 ENCOUNTER — Ambulatory Visit: Payer: BLUE CROSS/BLUE SHIELD

## 2018-07-18 ENCOUNTER — Ambulatory Visit: Payer: BLUE CROSS/BLUE SHIELD

## 2018-07-18 ENCOUNTER — Ambulatory Visit: Payer: BLUE CROSS/BLUE SHIELD | Admitting: Internal Medicine

## 2018-08-20 ENCOUNTER — Other Ambulatory Visit: Payer: Self-pay | Admitting: Family Medicine

## 2018-08-20 ENCOUNTER — Other Ambulatory Visit: Payer: Self-pay | Admitting: *Deleted

## 2018-08-20 MED ORDER — VITAMIN D (ERGOCALCIFEROL) 1.25 MG (50000 UNIT) PO CAPS: 50000.0000 [IU] | ORAL_CAPSULE | ORAL | 4 refills | Status: DC

## 2018-08-20 NOTE — Telephone Encounter (Signed)
Patient requesting we refill her Vita D, I explained to her that she needs to contact Dr Sherrie Mustache for the refill since he is the prescriber for this and we are not treating her for this. She will contact Dr Sherrie Mustache

## 2018-08-20 NOTE — Telephone Encounter (Signed)
Pt needing a refill on:  Vitamin D, Ergocalciferol, (DRISDOL) 50000 units CAPS capsule  Please call into: [facility] - [place], [place] - [address]   Thanks, TGH

## 2018-08-27 ENCOUNTER — Telehealth: Payer: Self-pay | Admitting: Family Medicine

## 2018-08-27 NOTE — Telephone Encounter (Signed)
Pt wants Dr. Sherrie Mustache to view her labs from the cancer center and write her a rx for Vit D. For 90 days  She uses [facility]  CB#  [phone]   Thanks Barth Kirks

## 2018-08-27 NOTE — Telephone Encounter (Signed)
I looked and seen where Dr. Sherrie Mustache sent in on 2/12 to [facility].  She said she has not heard anything from them but will call and check on it.  teri

## 2018-12-23 ENCOUNTER — Other Ambulatory Visit: Payer: Self-pay

## 2018-12-23 ENCOUNTER — Other Ambulatory Visit: Payer: Self-pay | Admitting: *Deleted

## 2018-12-23 DIAGNOSIS — D500 Iron deficiency anemia secondary to blood loss (chronic): Secondary | ICD-10-CM

## 2018-12-23 DIAGNOSIS — N920 Excessive and frequent menstruation with regular cycle: Secondary | ICD-10-CM

## 2018-12-24 ENCOUNTER — Other Ambulatory Visit: Payer: Self-pay

## 2018-12-24 ENCOUNTER — Inpatient Hospital Stay: Payer: BC Managed Care – PPO | Attending: Internal Medicine

## 2018-12-24 DIAGNOSIS — Z801 Family history of malignant neoplasm of trachea, bronchus and lung: Secondary | ICD-10-CM | POA: Diagnosis not present

## 2018-12-24 DIAGNOSIS — Z825 Family history of asthma and other chronic lower respiratory diseases: Secondary | ICD-10-CM | POA: Insufficient documentation

## 2018-12-24 DIAGNOSIS — Z8249 Family history of ischemic heart disease and other diseases of the circulatory system: Secondary | ICD-10-CM | POA: Diagnosis not present

## 2018-12-24 DIAGNOSIS — Z79899 Other long term (current) drug therapy: Secondary | ICD-10-CM | POA: Insufficient documentation

## 2018-12-24 DIAGNOSIS — Z833 Family history of diabetes mellitus: Secondary | ICD-10-CM | POA: Diagnosis not present

## 2018-12-24 DIAGNOSIS — Z800 Family history of malignant neoplasm of digestive organs: Secondary | ICD-10-CM | POA: Insufficient documentation

## 2018-12-24 DIAGNOSIS — N920 Excessive and frequent menstruation with regular cycle: Secondary | ICD-10-CM | POA: Insufficient documentation

## 2018-12-24 DIAGNOSIS — Z9884 Bariatric surgery status: Secondary | ICD-10-CM | POA: Diagnosis not present

## 2018-12-24 DIAGNOSIS — R5383 Other fatigue: Secondary | ICD-10-CM | POA: Diagnosis not present

## 2018-12-24 DIAGNOSIS — Z809 Family history of malignant neoplasm, unspecified: Secondary | ICD-10-CM | POA: Insufficient documentation

## 2018-12-24 DIAGNOSIS — Z886 Allergy status to analgesic agent status: Secondary | ICD-10-CM | POA: Diagnosis not present

## 2018-12-24 DIAGNOSIS — Z8711 Personal history of peptic ulcer disease: Secondary | ICD-10-CM | POA: Diagnosis not present

## 2018-12-24 DIAGNOSIS — I7300 Raynaud's syndrome without gangrene: Secondary | ICD-10-CM | POA: Insufficient documentation

## 2018-12-24 DIAGNOSIS — D500 Iron deficiency anemia secondary to blood loss (chronic): Secondary | ICD-10-CM | POA: Insufficient documentation

## 2018-12-24 LAB — CBC WITH DIFFERENTIAL/PLATELET
Abs Immature Granulocytes: 0 10*3/uL (ref 0.00–0.07)
Basophils Absolute: 0 10*3/uL (ref 0.0–0.1)
Basophils Relative: 0 %
Eosinophils Absolute: 0.1 10*3/uL (ref 0.0–0.5)
Eosinophils Relative: 3 %
HCT: 34.3 % — ABNORMAL LOW (ref 36.0–46.0)
Hemoglobin: 11.2 g/dL — ABNORMAL LOW (ref 12.0–15.0)
Immature Granulocytes: 0 %
Lymphocytes Relative: 29 %
Lymphs Abs: 1 10*3/uL (ref 0.7–4.0)
MCH: 29.9 pg (ref 26.0–34.0)
MCHC: 32.7 g/dL (ref 30.0–36.0)
MCV: 91.7 fL (ref 80.0–100.0)
Monocytes Absolute: 0.4 10*3/uL (ref 0.1–1.0)
Monocytes Relative: 10 %
Neutro Abs: 2 10*3/uL (ref 1.7–7.7)
Neutrophils Relative %: 58 %
Platelets: 252 10*3/uL (ref 150–400)
RBC: 3.74 MIL/uL — ABNORMAL LOW (ref 3.87–5.11)
RDW: 13.3 % (ref 11.5–15.5)
WBC: 3.5 10*3/uL — ABNORMAL LOW (ref 4.0–10.5)
nRBC: 0 % (ref 0.0–0.2)

## 2018-12-24 LAB — VITAMIN B12: Vitamin B-12: 218 pg/mL (ref 180–914)

## 2018-12-24 LAB — IRON AND TIBC
Iron: 58 ug/dL (ref 28–170)
Saturation Ratios: 13 % (ref 10.4–31.8)
TIBC: 445 ug/dL (ref 250–450)
UIBC: 387 ug/dL

## 2018-12-24 LAB — FERRITIN: Ferritin: 7 ng/mL — ABNORMAL LOW (ref 11–307)

## 2018-12-25 ENCOUNTER — Telehealth: Payer: Self-pay | Admitting: *Deleted

## 2018-12-25 NOTE — Telephone Encounter (Signed)
Per Dr. B patient will need her iv iron infusion tomorrow. Pt contacted. She is agreeable to keep her apts as scheduled.

## 2018-12-25 NOTE — Telephone Encounter (Signed)
Patient would like to know if she needs to come tomorrow for her md/fereheme infusion.

## 2018-12-26 ENCOUNTER — Inpatient Hospital Stay (HOSPITAL_BASED_OUTPATIENT_CLINIC_OR_DEPARTMENT_OTHER): Payer: BC Managed Care – PPO | Admitting: Internal Medicine

## 2018-12-26 ENCOUNTER — Inpatient Hospital Stay: Payer: BC Managed Care – PPO

## 2018-12-26 ENCOUNTER — Other Ambulatory Visit: Payer: Self-pay

## 2018-12-26 VITALS — BP 118/68 | HR 62 | Resp 17

## 2018-12-26 DIAGNOSIS — Z800 Family history of malignant neoplasm of digestive organs: Secondary | ICD-10-CM

## 2018-12-26 DIAGNOSIS — D500 Iron deficiency anemia secondary to blood loss (chronic): Secondary | ICD-10-CM

## 2018-12-26 DIAGNOSIS — Z886 Allergy status to analgesic agent status: Secondary | ICD-10-CM

## 2018-12-26 DIAGNOSIS — Z79899 Other long term (current) drug therapy: Secondary | ICD-10-CM

## 2018-12-26 DIAGNOSIS — R5383 Other fatigue: Secondary | ICD-10-CM | POA: Diagnosis not present

## 2018-12-26 DIAGNOSIS — Z825 Family history of asthma and other chronic lower respiratory diseases: Secondary | ICD-10-CM

## 2018-12-26 DIAGNOSIS — Z801 Family history of malignant neoplasm of trachea, bronchus and lung: Secondary | ICD-10-CM

## 2018-12-26 DIAGNOSIS — Z809 Family history of malignant neoplasm, unspecified: Secondary | ICD-10-CM

## 2018-12-26 DIAGNOSIS — Z8249 Family history of ischemic heart disease and other diseases of the circulatory system: Secondary | ICD-10-CM

## 2018-12-26 DIAGNOSIS — I7300 Raynaud's syndrome without gangrene: Secondary | ICD-10-CM | POA: Diagnosis not present

## 2018-12-26 DIAGNOSIS — N920 Excessive and frequent menstruation with regular cycle: Secondary | ICD-10-CM | POA: Diagnosis not present

## 2018-12-26 DIAGNOSIS — Z833 Family history of diabetes mellitus: Secondary | ICD-10-CM

## 2018-12-26 DIAGNOSIS — Z9884 Bariatric surgery status: Secondary | ICD-10-CM

## 2018-12-26 DIAGNOSIS — Z8711 Personal history of peptic ulcer disease: Secondary | ICD-10-CM

## 2018-12-26 MED ORDER — SODIUM CHLORIDE 0.9 % IV SOLN
Freq: Once | INTRAVENOUS | Status: AC
  Administered 2018-12-26: 14:00:00 via INTRAVENOUS
  Filled 2018-12-26: qty 250

## 2018-12-26 MED ORDER — SODIUM CHLORIDE 0.9 % IV SOLN
510.0000 mg | Freq: Once | INTRAVENOUS | Status: AC
  Administered 2018-12-26: 14:00:00 510 mg via INTRAVENOUS
  Filled 2018-12-26: qty 17

## 2018-12-26 NOTE — Progress Notes (Signed)
[facility] OFFICE PROGRESS NOTE  Patient Care Team: Birdie Sons, MD as PCP - General (Family Medicine) Dallas Schimke, MD as Referring Physician (Internal Medicine) Bary Castilla Forest Gleason, MD as Consulting Physician (General Surgery) Schermerhorn, Gwen Her, MD as Referring Physician (Obstetrics and Gynecology)   SUMMARY OF ONCOLOGIC HISTORY:  # IDA Laura Murray to gastric Bypass 2002/ menstrual blood loss]; IV iron q 6 M/ B12 IM [thru PCP]  # 2019- ? 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   Patient continues to complain of mild to moderate fatigue.  No nausea no vomiting no blood in stools questions.  Continues to have heavy menstrual periods.  Does also have intermittent episodes of burning pain and whitish discoloration of the fingertips are cold.  She has not been evaluated by rheumatology yet.  Review of Systems  Constitutional: Positive for malaise/fatigue. Negative for chills, diaphoresis, fever and weight loss.  HENT: Negative for nosebleeds and sore throat.   Eyes: Negative for double vision.  Respiratory: Negative for cough, hemoptysis, sputum production, shortness of breath and wheezing.   Cardiovascular: Negative for chest pain, palpitations, orthopnea and leg swelling.  Gastrointestinal: Negative for abdominal pain, blood in stool, constipation, diarrhea, heartburn, melena, nausea and vomiting.  Genitourinary: Negative for dysuria, frequency and urgency.  Musculoskeletal: Negative for back pain and joint pain.  Skin: Negative.  Negative for itching and rash.  Neurological: Negative for dizziness, tingling, focal weakness, weakness and headaches.  Endo/Heme/Allergies: Does not bruise/bleed easily.  Psychiatric/Behavioral: Negative for depression. The patient is not nervous/anxious and does not have insomnia.    Marland Kitchen    PAST MEDICAL HISTORY :  Past Medical History:  Diagnosis Date  . Anemia   . Insomnia   . Obesity   . Ovarian cyst   . Upper GI bleeding 2015   gastric ulcer    PAST SURGICAL HISTORY :   Past Surgical History:  Procedure Laterality Date  . BREAST CYST ASPIRATION Left 02/02/2014   Dr Bary Castilla  . BREAST CYST ASPIRATION Right 02/2018  . CHOLECYSTECTOMY  2009  . COLONOSCOPY  2014  . GASTRIC BYPASS  2006, 2015   2015 revision  . GASTRIC RESECTION  2015  . SHOULDER ARTHROSCOPY WITH OPEN ROTATOR CUFF REPAIR Right 03/27/2018   Procedure: SHOULDER ARTHROSCOPY WITH OPEN ROTATOR CUFF REPAIR;  Surgeon: Corky Mull, MD;  Location: [facility] ORS;  Service: Orthopedics;  Laterality: Right;  . SHOULDER CLOSED REDUCTION Right 07/08/2018   Procedure: CLOSED MANIPULATION SHOULDER;  Surgeon: Corky Mull, MD;  Location: [facility] ORS;  Service: Orthopedics;  Laterality: Right;  . STERIOD INJECTION Right 07/08/2018   Procedure: STEROID INJECTION OF RIGHT SHOULDER;  Surgeon: Corky Mull, MD;  Location: [facility] ORS;  Service: Orthopedics;  Laterality: Right;  . TONSILLECTOMY    . TUBAL LIGATION    . UPPER GI ENDOSCOPY      FAMILY HISTORY :   Family History  Problem Relation Age of Onset  . Lung cancer Maternal Grandfather   . Cancer Maternal Grandfather   . Diabetes Mother   . Hypertension Mother   . Diabetes Father   . Hypertension Father   . Crohn's disease Son   . Rheum arthritis Son   . Asthma Son   . Cancer Maternal Grandmother   . Breast cancer Maternal Aunt        [id number]  .  Colon cancer Maternal Uncle     SOCIAL HISTORY:   Social History   Tobacco Use  . Smoking status: Never Smoker  . Smokeless tobacco: Never Used  Substance Use Topics  . Alcohol use: No  . Drug use: No    ALLERGIES:  is allergic to nsaids.  MEDICATIONS:  Current Outpatient Medications  Medication Sig Dispense Refill  . acetaminophen (TYLENOL) 500 MG tablet Take 1,000 mg by mouth every 6 (six) hours as  needed for moderate pain or headache.     Marland Kitchen. amitriptyline (ELAVIL) 50 MG tablet Take 50 mg by mouth at bedtime as needed for sleep.    . Calcium-Magnesium-Vitamin D (CALCIUM 500 PO) Take 500 mg by mouth 2 (two) times daily.    . CELLULOSE PO Take 1 tablet by mouth 3 (three) times daily.     . Magnesium 400 MG TABS Take 400 mg by mouth daily.    . Probiotic CAPS Take 1 capsule by mouth daily.    . Vitamin D, Ergocalciferol, (DRISDOL) 1.25 MG (50000 UT) CAPS capsule Take 1 capsule (50,000 Units total) by mouth once a week. 12 capsule 4   No current facility-administered medications for this visit.     PHYSICAL EXAMINATION: ECOG PERFORMANCE STATUS: 0 - Asymptomatic  BP 105/68 (BP Location: Left Arm, Patient Position: Sitting, Cuff Size: Normal)   Pulse 66   Temp 97.8 F (36.6 C) (Tympanic)   Resp 20   Ht 5\' 4"  (1.626 m)   Wt 140 lb (63.5 kg)   BMI 24.03 kg/m   Filed Weights   12/26/18 1320  Weight: 140 lb (63.5 kg)    Physical Exam  Constitutional: She is oriented to person, place, and time and well-developed, well-nourished, and in no distress.  HENT:  Head: Normocephalic and atraumatic.  Mouth/Throat: Oropharynx is clear and moist. No oropharyngeal exudate.  Eyes: Pupils are equal, round, and reactive to light.  Neck: Normal range of motion. Neck supple.  Cardiovascular: Normal rate and regular rhythm.  Pulmonary/Chest: No respiratory distress. She has no wheezes.  Abdominal: Soft. Bowel sounds are normal. She exhibits no distension and no mass. There is no abdominal tenderness. There is no rebound and no guarding.  Musculoskeletal: Normal range of motion.        General: No tenderness or edema.  Neurological: She is alert and oriented to person, place, and time.  Skin: Skin is warm.  Psychiatric: Affect normal.    LABORATORY DATA:  I have reviewed the data as listed    Component Value Date/Time   NA 139 06/20/2018 1509   NA 141 06/10/2015 0929   NA 142 03/23/2013  0428   K 3.9 06/20/2018 1509   K 4.1 06/29/2013 0902   CL 107 06/20/2018 1509   CL 112 (H) 03/23/2013 0428   CO2 25 06/20/2018 1509   CO2 24 03/23/2013 0428   GLUCOSE 104 (H) 06/20/2018 1509   GLUCOSE 90 03/23/2013 0428   BUN 16 06/20/2018 1509   BUN 11 06/10/2015 0929   BUN 5 (L) 03/23/2013 0428   CREATININE 0.97 06/20/2018 1509   CREATININE 0.75 06/12/2013 0842   CALCIUM 8.5 (L) 06/20/2018 1509   CALCIUM 7.7 (L) 03/23/2013 0428   PROT 7.2 06/20/2018 1509   PROT 6.6 06/10/2015 0929   PROT 6.1 (L) 03/20/2013 1843   ALBUMIN 3.8 06/20/2018 1509   ALBUMIN 3.9 06/10/2015 0929   ALBUMIN 3.0 (L) 03/20/2013 1843   AST 23 06/20/2018 1509   AST 25 03/20/2013  1843   ALT 21 06/20/2018 1509   ALT 49 03/20/2013 1843   ALKPHOS 64 06/20/2018 1509   ALKPHOS 84 03/20/2013 1843   BILITOT 0.5 06/20/2018 1509   BILITOT 0.3 06/10/2015 0929   BILITOT 0.1 (L) 03/20/2013 1843   GFRNONAA >60 06/20/2018 1509   GFRNONAA >60 06/12/2013 0842   GFRAA >60 06/20/2018 1509   GFRAA >60 06/12/2013 0842    No results found for: SPEP, UPEP  Lab Results  Component Value Date   WBC 3.5 (L) 12/24/2018   NEUTROABS 2.0 12/24/2018   HGB 11.2 (L) 12/24/2018   HCT 34.3 (L) 12/24/2018   MCV 91.7 12/24/2018   PLT 252 12/24/2018      Chemistry      Component Value Date/Time   NA 139 06/20/2018 1509   NA 141 06/10/2015 0929   NA 142 03/23/2013 0428   K 3.9 06/20/2018 1509   K 4.1 06/29/2013 0902   CL 107 06/20/2018 1509   CL 112 (H) 03/23/2013 0428   CO2 25 06/20/2018 1509   CO2 24 03/23/2013 0428   BUN 16 06/20/2018 1509   BUN 11 06/10/2015 0929   BUN 5 (L) 03/23/2013 0428   CREATININE 0.97 06/20/2018 1509   CREATININE 0.75 06/12/2013 0842   GLU 82 10/16/2013      Component Value Date/Time   CALCIUM 8.5 (L) 06/20/2018 1509   CALCIUM 7.7 (L) 03/23/2013 0428   ALKPHOS 64 06/20/2018 1509   ALKPHOS 84 03/20/2013 1843   AST 23 06/20/2018 1509   AST 25 03/20/2013 1843   ALT 21 06/20/2018 1509    ALT 49 03/20/2013 1843   BILITOT 0.5 06/20/2018 1509   BILITOT 0.3 06/10/2015 0929   BILITOT 0.1 (L) 03/20/2013 1843        ASSESSMENT & PLAN:  Iron deficiency anemia due to chronic blood loss # 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  Proceed with IV Feraheme today.  #Borderline low B12-218.  Recommend sublingual supplementation.  #Raynard's phenomena-unclear etiology. Defer to PCP/referral to rheumatology.   # DISPOSITION: #Feraheme today. # Follow up in 6 months/labs- cbc; b12 levels/ferritin/iron studies/possible ferrahem-Dr.B  Cc;Dr.[name].      Earna CoderGovinda R , MD 12/26/2018 10:13 PM

## 2018-12-26 NOTE — Assessment & Plan Note (Addendum)
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  Proceed with IV Feraheme today.  #Borderline low B12-218.  Recommend sublingual supplementation.  #Raynard's phenomena-unclear etiology. Defer to PCP/referral to rheumatology.   # DISPOSITION: #Feraheme today. # Follow up in 6 months/labs- cbc; b12 levels/ferritin/iron studies/possible ferrahem-Dr.B  Cc;Dr.[name].

## 2019-01-02 ENCOUNTER — Telehealth: Payer: Self-pay | Admitting: *Deleted

## 2019-01-02 DIAGNOSIS — I7300 Raynaud's syndrome without gangrene: Secondary | ICD-10-CM

## 2019-01-02 NOTE — Addendum Note (Signed)
Addended by: Sabino Gasser on: 01/02/2019 11:56 AM   Modules accepted: Orders

## 2019-01-02 NOTE — Telephone Encounter (Signed)
patient would prefer that Dr. Rogue Bussing make the referral.  Doesn't want to go to another doctor for this as this would generate more bills for her. Her husband laid off. Again. She prefers that Dr. B initiate this referral.  Dr. Jacinto Reap please advise.

## 2019-01-02 NOTE — Telephone Encounter (Signed)
#  Raynard's phenomena-unclear etiology. Defer to PCP/referral to rheumatology.   Hassan Rowan - this is Dr. Sharmaine Base note.  The PCP needs to refer her

## 2019-01-02 NOTE — Telephone Encounter (Signed)
Patient called sating Dr Rogue Bussing wanted her to see Rheumatology. She called to get appointment, but was told she has to be referred, so she is asking that we send a referral for her to see Dr Precious Reel

## 2019-01-02 NOTE — Telephone Encounter (Signed)
H/C- please make a referral to rheumatology Dx: raynauds. Thanks GB

## 2019-01-02 NOTE — Telephone Encounter (Signed)
Left voice mail on patient phone to have PCP make referral to Rheumatology

## 2019-01-02 NOTE — Telephone Encounter (Signed)
Patient made aware that referral was faxed to rheumatology.

## 2019-01-19 ENCOUNTER — Other Ambulatory Visit: Payer: Self-pay | Admitting: Obstetrics and Gynecology

## 2019-01-19 ENCOUNTER — Other Ambulatory Visit: Payer: Self-pay | Admitting: Internal Medicine

## 2019-01-19 DIAGNOSIS — N6320 Unspecified lump in the left breast, unspecified quadrant: Secondary | ICD-10-CM

## 2019-01-23 ENCOUNTER — Ambulatory Visit
Admission: RE | Admit: 2019-01-23 | Discharge: 2019-01-23 | Disposition: A | Discharge status: Home / Self Care | Payer: BC Managed Care – PPO | Source: Ambulatory Visit | Attending: Obstetrics and Gynecology | Admitting: Obstetrics and Gynecology

## 2019-01-23 ENCOUNTER — Other Ambulatory Visit: Payer: Self-pay

## 2019-01-23 DIAGNOSIS — N6320 Unspecified lump in the left breast, unspecified quadrant: Secondary | ICD-10-CM | POA: Insufficient documentation

## 2019-02-04 ENCOUNTER — Encounter: Payer: Self-pay | Admitting: Family Medicine

## 2019-02-04 DIAGNOSIS — I7300 Raynaud's syndrome without gangrene: Secondary | ICD-10-CM | POA: Insufficient documentation

## 2019-06-23 ENCOUNTER — Other Ambulatory Visit: Payer: Self-pay | Admitting: Internal Medicine

## 2019-06-23 ENCOUNTER — Telehealth: Payer: Self-pay | Admitting: Internal Medicine

## 2019-06-23 ENCOUNTER — Other Ambulatory Visit: Payer: Self-pay

## 2019-06-23 ENCOUNTER — Inpatient Hospital Stay: Payer: BC Managed Care – PPO | Attending: Internal Medicine

## 2019-06-23 DIAGNOSIS — Z801 Family history of malignant neoplasm of trachea, bronchus and lung: Secondary | ICD-10-CM | POA: Diagnosis not present

## 2019-06-23 DIAGNOSIS — D500 Iron deficiency anemia secondary to blood loss (chronic): Secondary | ICD-10-CM

## 2019-06-23 DIAGNOSIS — Z8249 Family history of ischemic heart disease and other diseases of the circulatory system: Secondary | ICD-10-CM | POA: Insufficient documentation

## 2019-06-23 DIAGNOSIS — Z8261 Family history of arthritis: Secondary | ICD-10-CM | POA: Insufficient documentation

## 2019-06-23 DIAGNOSIS — R5383 Other fatigue: Secondary | ICD-10-CM | POA: Insufficient documentation

## 2019-06-23 DIAGNOSIS — Z803 Family history of malignant neoplasm of breast: Secondary | ICD-10-CM | POA: Insufficient documentation

## 2019-06-23 DIAGNOSIS — Z79899 Other long term (current) drug therapy: Secondary | ICD-10-CM | POA: Diagnosis not present

## 2019-06-23 DIAGNOSIS — Z9884 Bariatric surgery status: Secondary | ICD-10-CM | POA: Diagnosis not present

## 2019-06-23 DIAGNOSIS — E538 Deficiency of other specified B group vitamins: Secondary | ICD-10-CM | POA: Diagnosis present

## 2019-06-23 DIAGNOSIS — Z833 Family history of diabetes mellitus: Secondary | ICD-10-CM | POA: Insufficient documentation

## 2019-06-23 LAB — CBC WITH DIFFERENTIAL/PLATELET
Abs Immature Granulocytes: 0 10*3/uL (ref 0.00–0.07)
Basophils Absolute: 0 10*3/uL (ref 0.0–0.1)
Basophils Relative: 0 %
Eosinophils Absolute: 0.1 10*3/uL (ref 0.0–0.5)
Eosinophils Relative: 3 %
HCT: 37.8 % (ref 36.0–46.0)
Hemoglobin: 12.1 g/dL (ref 12.0–15.0)
Immature Granulocytes: 0 %
Lymphocytes Relative: 32 %
Lymphs Abs: 0.8 10*3/uL (ref 0.7–4.0)
MCH: 30.7 pg (ref 26.0–34.0)
MCHC: 32 g/dL (ref 30.0–36.0)
MCV: 95.9 fL (ref 80.0–100.0)
Monocytes Absolute: 0.2 10*3/uL (ref 0.1–1.0)
Monocytes Relative: 9 %
Neutro Abs: 1.4 10*3/uL — ABNORMAL LOW (ref 1.7–7.7)
Neutrophils Relative %: 56 %
Platelets: 250 10*3/uL (ref 150–400)
RBC: 3.94 MIL/uL (ref 3.87–5.11)
RDW: 12.1 % (ref 11.5–15.5)
WBC: 2.6 10*3/uL — ABNORMAL LOW (ref 4.0–10.5)
nRBC: 0 % (ref 0.0–0.2)

## 2019-06-23 LAB — FERRITIN: Ferritin: 20 ng/mL (ref 11–307)

## 2019-06-23 LAB — IRON AND TIBC
Iron: 145 ug/dL (ref 28–170)
Saturation Ratios: 39 % — ABNORMAL HIGH (ref 10.4–31.8)
TIBC: 374 ug/dL (ref 250–450)
UIBC: 229 ug/dL

## 2019-06-23 LAB — VITAMIN B12: Vitamin B-12: 154 pg/mL — ABNORMAL LOW (ref 180–914)

## 2019-06-23 NOTE — Telephone Encounter (Signed)
Please schedule the pt  for b12 injection at her next appt. Thx GB

## 2019-06-24 ENCOUNTER — Other Ambulatory Visit: Payer: Self-pay

## 2019-06-24 ENCOUNTER — Other Ambulatory Visit: Payer: BC Managed Care – PPO

## 2019-06-24 NOTE — Telephone Encounter (Signed)
B12 INJECTION added to pt's schedule

## 2019-06-25 ENCOUNTER — Other Ambulatory Visit: Payer: Self-pay

## 2019-06-25 ENCOUNTER — Inpatient Hospital Stay (HOSPITAL_BASED_OUTPATIENT_CLINIC_OR_DEPARTMENT_OTHER): Payer: BC Managed Care – PPO | Admitting: Internal Medicine

## 2019-06-25 ENCOUNTER — Inpatient Hospital Stay: Payer: BC Managed Care – PPO

## 2019-06-25 DIAGNOSIS — D500 Iron deficiency anemia secondary to blood loss (chronic): Secondary | ICD-10-CM | POA: Diagnosis not present

## 2019-06-25 DIAGNOSIS — D508 Other iron deficiency anemias: Secondary | ICD-10-CM | POA: Diagnosis not present

## 2019-06-25 MED ORDER — CYANOCOBALAMIN 1000 MCG/ML IJ SOLN
1000.0000 ug | Freq: Once | INTRAMUSCULAR | Status: AC
  Administered 2019-06-25: 1000 ug via INTRAMUSCULAR
  Filled 2019-06-25: qty 1

## 2019-06-25 NOTE — Assessment & Plan Note (Signed)
#   IDA- secondary to gastric bypass/menstrual blood loss; Hemoglobin is 12.1 sat-33%.  HOLD V Feraheme today.  # B12 low- 158; proceed with B12 injection every 3 months.   # fatigue/insominia- ? OSA/defer to PCP for sleep study.   # DISPOSITION: # HOLD ferrahem today; b12 shot today # B12 injection in 3 months # Follow up in 6 months-MD; few days prior- labs- cbc; b12 levels/ferritin/iron studies; possible ferrahem/b12 injection-Dr.B  Cc;Dr.[name].

## 2019-06-25 NOTE — Assessment & Plan Note (Deleted)
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  Proceed with IV Feraheme today.  # B12 low- 158; proceed with B12 injection every 3 months.   # Raynard's phenomena-unclear etiology. Defer to PCP/referral to rheumatology.   # DISPOSITION: # HOLD ferrahem today; b12 shot today # B12 injection in 3 months # Follow up in 6 months-MD; few days prior- labs- cbc; b12 levels/ferritin/iron studies; possible ferrahem/b12 injection-Dr.B  Cc;Dr.[name].

## 2019-06-25 NOTE — Progress Notes (Signed)
[facility] OFFICE PROGRESS NOTE  Patient Care Team: Birdie Sons, MD as PCP - General (Family Medicine) Dallas Schimke, MD as Referring Physician (Internal Medicine) Bary Castilla Forest Gleason, MD as Consulting Physician (General Surgery) Schermerhorn, Gwen Her, MD as Referring Physician (Obstetrics and Gynecology)   SUMMARY OF ONCOLOGIC HISTORY:  # IDA [place] to gastric Bypass 2002/ menstrual blood loss]; IV iron q 6 M/ B12 IM [thru PCP]; B12 def-   # 2019- ? raynauds' s/p rheuma eval.   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  Patient continues to complain of moderate to severe fatigue.  No nausea no vomiting no blood in stools   Continues to have heavy menstrual periods for the first few days.  Review of Systems  Constitutional: Positive for malaise/fatigue. Negative for chills, diaphoresis, fever and weight loss.  HENT: Negative for nosebleeds and sore throat.   Eyes: Negative for double vision.  Respiratory: Negative for cough, hemoptysis, sputum production, shortness of breath and wheezing.   Cardiovascular: Negative for chest pain, palpitations, orthopnea and leg swelling.  Gastrointestinal: Negative for abdominal pain, blood in stool, constipation, diarrhea, heartburn, melena, nausea and vomiting.  Genitourinary: Negative for dysuria, frequency and urgency.  Musculoskeletal: Negative for back pain and joint pain.  Skin: Negative.  Negative for itching and rash.  Neurological: Negative for dizziness, tingling, focal weakness, weakness and headaches.  Endo/Heme/Allergies: Does not bruise/bleed easily.  Psychiatric/Behavioral: Negative for depression. The patient has insomnia. The patient is not nervous/anxious.    Marland Kitchen   PAST MEDICAL HISTORY :  Past Medical History:  Diagnosis Date  . Anemia   . Insomnia   . Obesity   . Ovarian  cyst   . Upper GI bleeding 2015   gastric ulcer    PAST SURGICAL HISTORY :   Past Surgical History:  Procedure Laterality Date  . BREAST CYST ASPIRATION Left 02/02/2014   Dr Bary Castilla  . BREAST CYST ASPIRATION Right 02/2018  . CHOLECYSTECTOMY  2009  . COLONOSCOPY  2014  . GASTRIC BYPASS  2006, 2015   2015 revision  . GASTRIC RESECTION  2015  . SHOULDER ARTHROSCOPY WITH OPEN ROTATOR CUFF REPAIR Right 03/27/2018   Procedure: SHOULDER ARTHROSCOPY WITH OPEN ROTATOR CUFF REPAIR;  Surgeon: Corky Mull, MD;  Location: [facility];  Service: Orthopedics;  Laterality: Right;  . SHOULDER CLOSED REDUCTION Right 07/08/2018   Procedure: CLOSED MANIPULATION SHOULDER;  Surgeon: Corky Mull, MD;  Location: [facility];  Service: Orthopedics;  Laterality: Right;  . STERIOD INJECTION Right 07/08/2018   Procedure: STEROID INJECTION OF RIGHT SHOULDER;  Surgeon: Corky Mull, MD;  Location: [facility];  Service: Orthopedics;  Laterality: Right;  . TONSILLECTOMY    . TUBAL LIGATION    . UPPER GI ENDOSCOPY      FAMILY HISTORY :   Family History  Problem Relation Age of Onset  . Lung cancer Maternal Grandfather   . Cancer Maternal Grandfather   . Diabetes Mother   . Hypertension Mother   . Diabetes Father   . Hypertension Father   . Crohn's disease Son   . Rheum arthritis Son   . Asthma Son   . Cancer Maternal Grandmother   . Breast cancer Maternal Aunt        [id number]  . Colon cancer Maternal Uncle     SOCIAL HISTORY:   Social History   Tobacco  Use  . Smoking status: Never Smoker  . Smokeless tobacco: Never Used  Substance Use Topics  . Alcohol use: No  . Drug use: No    ALLERGIES:  is allergic to nsaids.  MEDICATIONS:  Current Outpatient Medications  Medication Sig Dispense Refill  . acetaminophen (TYLENOL) 500 MG tablet Take 1,000 mg by mouth every 6 (six) hours as needed for moderate pain or headache.     Marland Kitchen amitriptyline (ELAVIL) 50 MG tablet Take 50 mg by mouth at bedtime as  needed for sleep.    . Calcium-Magnesium-Vitamin D (CALCIUM 500 PO) Take 500 mg by mouth 2 (two) times daily.    . CELLULOSE PO Take 1 tablet by mouth 3 (three) times daily.     . Magnesium 400 MG TABS Take 400 mg by mouth daily.    . Probiotic CAPS Take 1 capsule by mouth daily.    . Vitamin D, Ergocalciferol, (DRISDOL) 1.25 MG (50000 UT) CAPS capsule Take 1 capsule (50,000 Units total) by mouth once a week. 12 capsule 4   No current facility-administered medications for this visit.    PHYSICAL EXAMINATION: ECOG PERFORMANCE STATUS: 0 - Asymptomatic  BP 131/66 (BP Location: Left Arm, Patient Position: Sitting)   Pulse 60   Temp 98.2 F (36.8 C) (Tympanic)   Wt 152 lb 9.6 oz (69.2 kg)   BMI 26.19 kg/m   Filed Weights   06/25/19 1006  Weight: 152 lb 9.6 oz (69.2 kg)    Physical Exam  Constitutional: She is oriented to person, place, and time and well-developed, well-nourished, and in no distress.  HENT:  Head: Normocephalic and atraumatic.  Mouth/Throat: Oropharynx is clear and moist. No oropharyngeal exudate.  Eyes: Pupils are equal, round, and reactive to light.  Cardiovascular: Normal rate and regular rhythm.  Pulmonary/Chest: No respiratory distress. She has no wheezes.  Abdominal: Soft. Bowel sounds are normal. She exhibits no distension and no mass. There is no abdominal tenderness. There is no rebound and no guarding.  Musculoskeletal:        General: No tenderness or edema. Normal range of motion.     Cervical back: Normal range of motion and neck supple.  Neurological: She is alert and oriented to person, place, and time.  Skin: Skin is warm.  Psychiatric: Affect normal.    LABORATORY DATA:  I have reviewed the data as listed    Component Value Date/Time   NA 139 06/20/2018 1509   NA 141 06/10/2015 0929   NA 142 03/23/2013 0428   K 3.9 06/20/2018 1509   K 4.1 06/29/2013 0902   CL 107 06/20/2018 1509   CL 112 (H) 03/23/2013 0428   CO2 25 06/20/2018 1509    CO2 24 03/23/2013 0428   GLUCOSE 104 (H) 06/20/2018 1509   GLUCOSE 90 03/23/2013 0428   BUN 16 06/20/2018 1509   BUN 11 06/10/2015 0929   BUN 5 (L) 03/23/2013 0428   CREATININE 0.97 06/20/2018 1509   CREATININE 0.75 06/12/2013 0842   CALCIUM 8.5 (L) 06/20/2018 1509   CALCIUM 7.7 (L) 03/23/2013 0428   PROT 7.2 06/20/2018 1509   PROT 6.6 06/10/2015 0929   PROT 6.1 (L) 03/20/2013 1843   ALBUMIN 3.8 06/20/2018 1509   ALBUMIN 3.9 06/10/2015 0929   ALBUMIN 3.0 (L) 03/20/2013 1843   AST 23 06/20/2018 1509   AST 25 03/20/2013 1843   ALT 21 06/20/2018 1509   ALT 49 03/20/2013 1843   ALKPHOS 64 06/20/2018 1509   ALKPHOS 84  03/20/2013 1843   BILITOT 0.5 06/20/2018 1509   BILITOT 0.3 06/10/2015 0929   BILITOT 0.1 (L) 03/20/2013 1843   GFRNONAA >60 06/20/2018 1509   GFRNONAA >60 06/12/2013 0842   GFRAA >60 06/20/2018 1509   GFRAA >60 06/12/2013 0842    No results found for: SPEP, UPEP  Lab Results  Component Value Date   WBC 2.6 (L) 06/23/2019   NEUTROABS 1.4 (L) 06/23/2019   HGB 12.1 06/23/2019   HCT 37.8 06/23/2019   MCV 95.9 06/23/2019   PLT 250 06/23/2019      Chemistry      Component Value Date/Time   NA 139 06/20/2018 1509   NA 141 06/10/2015 0929   NA 142 03/23/2013 0428   K 3.9 06/20/2018 1509   K 4.1 06/29/2013 0902   CL 107 06/20/2018 1509   CL 112 (H) 03/23/2013 0428   CO2 25 06/20/2018 1509   CO2 24 03/23/2013 0428   BUN 16 06/20/2018 1509   BUN 11 06/10/2015 0929   BUN 5 (L) 03/23/2013 0428   CREATININE 0.97 06/20/2018 1509   CREATININE 0.75 06/12/2013 0842   GLU 82 10/16/2013 0000      Component Value Date/Time   CALCIUM 8.5 (L) 06/20/2018 1509   CALCIUM 7.7 (L) 03/23/2013 0428   ALKPHOS 64 06/20/2018 1509   ALKPHOS 84 03/20/2013 1843   AST 23 06/20/2018 1509   AST 25 03/20/2013 1843   ALT 21 06/20/2018 1509   ALT 49 03/20/2013 1843   BILITOT 0.5 06/20/2018 1509   BILITOT 0.3 06/10/2015 0929   BILITOT 0.1 (L) 03/20/2013 1843         ASSESSMENT & PLAN:  Acquired iron deficiency anemia due to decreased absorption # IDA- secondary to gastric bypass/menstrual blood loss; Hemoglobin is 12.1 sat-33%.  HOLD V Feraheme today.  # B12 low- 158; proceed with B12 injection every 3 months.   # fatigue/insominia- ? OSA/defer to PCP for sleep study.   # DISPOSITION: # HOLD ferrahem today; b12 shot today # B12 injection in 3 months # Follow up in 6 months-MD; few days prior- labs- cbc; b12 levels/ferritin/iron studies; possible ferrahem/b12 injection-Dr.B  Cc;Dr.[name].      Earna CoderGovinda R Taylen Osorto, MD 06/25/2019 10:23 AM

## 2019-06-26 ENCOUNTER — Ambulatory Visit: Payer: BC Managed Care – PPO | Admitting: Internal Medicine

## 2019-06-26 ENCOUNTER — Ambulatory Visit: Payer: BC Managed Care – PPO

## 2019-08-03 ENCOUNTER — Telehealth: Payer: Self-pay | Admitting: *Deleted

## 2019-08-03 NOTE — Telephone Encounter (Signed)
Patient informed of doctor response. She thanked me for calling

## 2019-08-03 NOTE — Telephone Encounter (Signed)
Per Md - patient may have covid vaccine when available for pt.

## 2019-08-03 NOTE — Telephone Encounter (Signed)
Patient called asking if Dr B thinks she will be ok to get the COVID vaccine Friday. Please advise

## 2019-08-06 NOTE — Telephone Encounter (Addendum)
Pt states her husband tested positive wed, 08/05/19. Pt wants to know if she should get the vaccine tomorrow am, as she has appt for her first covid shot Friday morning.  Pt also wants to know if she should get tested for the covid, or just quarantine? Pt is not having any symptoms.  Please advise.  Pt needs to know something asap, since her appt is tomorrow.

## 2019-08-07 NOTE — Telephone Encounter (Signed)
Advised pt that she would need to quarantine so it would be best if she reschedules her Covid vaccine.  Thanks,   -Vernona Rieger

## 2019-08-10 ENCOUNTER — Ambulatory Visit: Payer: BC Managed Care – PPO | Attending: Internal Medicine

## 2019-08-10 DIAGNOSIS — Z20822 Contact with and (suspected) exposure to covid-19: Secondary | ICD-10-CM

## 2019-08-11 LAB — NOVEL CORONAVIRUS, NAA: SARS-CoV-2, NAA: NOT DETECTED

## 2019-09-25 ENCOUNTER — Ambulatory Visit: Payer: BC Managed Care – PPO

## 2019-09-25 ENCOUNTER — Inpatient Hospital Stay: Payer: Self-pay | Attending: Internal Medicine

## 2019-10-18 ENCOUNTER — Other Ambulatory Visit: Payer: Self-pay | Admitting: Family Medicine

## 2019-10-18 NOTE — Telephone Encounter (Signed)
Requested medications are due for refill today?  Yes   - Medication strengths of 50,000 IUs cannot be delegated.    Requested medications are on active medication list?  Yes  Last Refill:   08/20/2018   12 capsules with 4 refills.  Future visit scheduled?  No    Notes to Clinic:  Patient has not had an office visit in 2 years.  Last Vit D level was 09/18/2017.

## 2019-10-18 NOTE — Telephone Encounter (Signed)
Requested medication (s) are due for refill today: yes  Requested medication (s) are on the active medication list: yes  Last refill:  08/20/18  Future visit scheduled: no  Notes to clinic:  overdue for labs, medication not delegated to NT to refill-    Requested Prescriptions  Pending Prescriptions Disp Refills   Vitamin D, Ergocalciferol, (DRISDOL) 1.25 MG (50000 UNIT) CAPS capsule [Pharmacy Med Name: VIT D-2 CAP(ERGOCAL)1.25MG  50,000U] 12 capsule 3    Sig: TAKE 1 CAPSULE ONCE A WEEK      Endocrinology:  Vitamins - Vitamin D Supplementation Failed - 10/18/2019  6:56 AM      Failed - 50,000 IU strengths are not delegated      Failed - Ca in normal range and within 360 days    Calcium  Date Value Ref Range Status  06/20/2018 8.5 (L) 8.9 - 10.3 mg/dL Final   Calcium, Total  Date Value Ref Range Status  03/23/2013 7.7 (L) 8.5 - 10.1 mg/dL Final          Failed - Phosphate in normal range and within 360 days    No results found for: PHOS        Failed - Vitamin D in normal range and within 360 days    Vit D, 25-Hydroxy  Date Value Ref Range Status  09/18/2017 34.2 30.0 - 100.0 ng/mL Final    Comment:    Vitamin D deficiency has been defined by the Institute of Medicine and an Endocrine Society practice guideline as a level of serum 25-OH vitamin D less than 20 ng/mL (1,2). The Endocrine Society went on to further define vitamin D insufficiency as a level between 21 and 29 ng/mL (2). 1. IOM (Institute of Medicine). 2010. Dietary reference    intakes for calcium and D. [place]: The    [employer]. 2. Holick MF, Binkley Merwin, Bischoff-Ferrari HA, et al.    Evaluation, treatment, and prevention of vitamin D    deficiency: an Endocrine Society clinical practice    guideline. JCEM. 2011 Jul; 96(7):1911-30.           Failed - Valid encounter within last 12 months    Recent Outpatient Visits           2 years ago B12 deficiency   [facility] Laura Limes, MD   3 years ago Diastasis recti   [facility] Laura Limes, MD   3 years ago B12 deficiency   [facility] Laura Limes, MD   4 years ago Insomnia   [facility] Laura Limes, MD       Future Appointments             In 2 months Laura Murray, Worthy Flank, MD [facility] Medical On[facility]

## 2019-10-19 NOTE — Telephone Encounter (Signed)
Please advised pt 89 prescription has been sent to mail order, but needs to make in appt to be seen office before any additional refills can be approved.

## 2019-11-17 ENCOUNTER — Telehealth: Payer: Self-pay

## 2019-11-17 NOTE — Telephone Encounter (Signed)
Copied from CRM [id number]. Topic: Appointment Scheduling - Scheduling Inquiry for Clinic >> Nov 17, 2019  4:29 PM Deborha Payment wrote: Reason for CRM: Patient is requesting a lab work done. PCP has no future labs done.  Patient would labs done then appt then appt with PCP. Call back [phone]

## 2019-11-18 NOTE — Telephone Encounter (Signed)
Last office visit was 09/18/2017. Patient advised that she needs office visit. Appointment scheduled for Friday 11/20/2019 at 8:20am.

## 2019-11-19 NOTE — Progress Notes (Signed)
Established patient visit   Patient: Laura Murray   DOB: 1966/01/01   54 y.o. Female  MRN: [id number] Visit Date: 11/20/2019  Today's healthcare provider: Lelon Huh, MD   Chief Complaint  Patient presents with  . Follow-up  . Anemia   Subjective    HPI Follow up for Anemia  The patient was last seen for this 09/18/2017  Changes made at last visit include patient advised to take daily vitamin B 12 supplement, 1000mg .  She reports good compliance with treatment. She feels that condition is Unchanged. She is not having side effects. none  --------------------------------------------------------------------  Follow up for Vitamin B 12 deficiency:  The patient was last seen for this 09/18/2017 Changes made at last visit include patient advised to take daily vitamin b12 supplement, 1000mg .  She reports good compliance with treatment. She feels that condition is Unchanged. She is not having side effects. fatigue  --------------------------------------------------------------------  Follow up for Vitamin D deficiency:  The patient was last seen for this 09/18/2017  Changes made at last visit include advised to continue Vitamin D weekly supplements.  She reports good compliance with treatment. She feels that condition is Unchanged. She is not having side effects. Fatigue   -------------------------------------------------------------------- She had history of gastric bypass with nuttrional deficiency. She is also followed by Dr. Rogue Bussing for iron deficient anemia and occasionally iron infusions. She had been getting b12 shots at his office, but hasn't had one in 6 months by her report and she would like to start getting theme here again.     Medications: Outpatient Medications Prior to Visit  Medication Sig  . acetaminophen (TYLENOL) 500 MG tablet Take 1,000 mg by mouth every 6 (six) hours as needed for moderate pain or headache.   Marland Kitchen amitriptyline  (ELAVIL) 50 MG tablet Take 50 mg by mouth at bedtime as needed for sleep.  . Calcium-Magnesium-Vitamin D (CALCIUM 500 PO) Take 500 mg by mouth 2 (two) times daily.  . CELLULOSE PO Take 1 tablet by mouth 3 (three) times daily.   . Magnesium 400 MG TABS Take 400 mg by mouth daily.  . Probiotic CAPS Take 1 capsule by mouth daily.  . Vitamin D, Ergocalciferol, (DRISDOL) 1.25 MG (50000 UNIT) CAPS capsule TAKE 1 CAPSULE ONCE A WEEK   No facility-administered medications prior to visit.    Review of Systems  Constitutional: Negative.  Negative for appetite change, chills, fatigue and fever.  Respiratory: Negative.  Negative for chest tightness and shortness of breath.   Cardiovascular: Negative.  Negative for chest pain and palpitations.  Gastrointestinal: Negative for abdominal pain, nausea and vomiting.  Musculoskeletal: Negative.   Neurological: Negative for dizziness and weakness.      Objective    BP 117/66 (BP Location: Left Arm, Patient Position: Sitting, Cuff Size: Large)   Pulse 65   Temp (!) 96.9 F (36.1 C) (Other (Comment))   Resp 16   Wt 165 lb (74.8 kg)   SpO2 99%   BMI 28.32 kg/m    Physical Exam   General: Appearance:     Overweight female in no acute distress  Eyes:    PERRL, conjunctiva/corneas clear, EOM's intact       Lungs:     Clear to auscultation bilaterally, respirations unlabored  Heart:    Normal heart rate. Normal rhythm. No murmurs, rubs, or gallops.   MS:   All extremities are intact.   Neurologic:   Awake, alert, oriented x 3. No apparent focal  neurological           defect.         No results found for any visits on 11/20/19.  Assessment & Plan     1. B12 deficiency V12 1mg  IM today then Q month.  - Vitamin B12  2. Vitamin D deficiency  - VITAMIN D 25 Hydroxy (Vit-D Deficiency, Fractures)  3. Iron deficiency anemia due to chronic blood loss  - Iron, TIBC and Ferritin Panel - CBC  4. Overweight  - Comprehensive metabolic panel -  Lipid panel - TSH Start - phentermine 15 MG capsule; Take 1 capsule (15 mg total) by mouth every morning.  Dispense: 30 capsule; Refill: 1 - topiramate (TOPAMAX) 50 MG tablet; 1/2 tablet every morning for 14 days, then increase to 1 tablet every morning for 14 days, then 2 tablets every morning  Dispense: 60 tablet; Refill: 0  Discussed potential adverse effects of both medications.   5. Cramp, abdominal  - Magnesium   Return in about 4 weeks (around 12/18/2019).      The entirety of the information documented in the History of Present Illness, Review of Systems and Physical Exam were personally obtained by me. Portions of this information were initially documented by the CMA and reviewed by me for thoroughness and accuracy.      02/17/2020, MD  [facility] [phone] (phone) [phone] (fax)  [facility]

## 2019-11-20 ENCOUNTER — Encounter: Payer: Self-pay | Admitting: Family Medicine

## 2019-11-20 ENCOUNTER — Other Ambulatory Visit: Payer: Self-pay

## 2019-11-20 ENCOUNTER — Ambulatory Visit (INDEPENDENT_AMBULATORY_CARE_PROVIDER_SITE_OTHER): Payer: BC Managed Care – PPO | Admitting: Family Medicine

## 2019-11-20 VITALS — BP 117/66 | HR 65 | Temp 96.9°F | Resp 16 | Wt 165.0 lb

## 2019-11-20 DIAGNOSIS — E663 Overweight: Secondary | ICD-10-CM | POA: Diagnosis not present

## 2019-11-20 DIAGNOSIS — R109 Unspecified abdominal pain: Secondary | ICD-10-CM

## 2019-11-20 DIAGNOSIS — E538 Deficiency of other specified B group vitamins: Secondary | ICD-10-CM | POA: Diagnosis not present

## 2019-11-20 DIAGNOSIS — D500 Iron deficiency anemia secondary to blood loss (chronic): Secondary | ICD-10-CM | POA: Diagnosis not present

## 2019-11-20 DIAGNOSIS — E559 Vitamin D deficiency, unspecified: Secondary | ICD-10-CM | POA: Diagnosis not present

## 2019-11-20 MED ORDER — PHENTERMINE HCL 15 MG PO CAPS: 15.0000 mg | ORAL_CAPSULE | ORAL | 1 refills | Status: DC

## 2019-11-20 MED ORDER — CYANOCOBALAMIN 1000 MCG/ML IJ SOLN
1000.0000 ug | Freq: Once | INTRAMUSCULAR | Status: AC
Start: 2019-11-20 — End: 2019-11-20
  Administered 2019-11-20: 1000 ug via INTRAMUSCULAR

## 2019-11-20 MED ORDER — TOPIRAMATE 50 MG PO TABS: ORAL_TABLET | ORAL | 0 refills | Status: DC

## 2019-11-20 NOTE — Progress Notes (Signed)
Established patient visit   Patient: Laura Murray   DOB: 07-28-1965   54 y.o. Female  MRN: [id number] Visit Date: 12/25/2019  Today's healthcare provider: Lelon Huh, MD   Chief Complaint  Patient presents with  . Weight Loss  . Vitamin D Deficiency   [employer] as a scribe for Lelon Huh, MD.,have documented all relevant documentation on the behalf of Lelon Huh, MD,as directed by  Lelon Huh, MD while in the presence of Lelon Huh, MD.  Subjective    HPI  Follow up for weight loss management  The patient was last seen for this on 11-20-2019 and started on topiramate and phentermine BMI Readings from Last 3 Encounters:  12/25/19 26.71 kg/m  11/20/19 28.32 kg/m  06/25/19 26.19 kg/m   Wt Readings from Last 3 Encounters:  12/25/19 155 lb 9.6 oz (70.6 kg)  11/20/19 165 lb (74.8 kg)  06/25/19 152 lb 9.6 oz (69.2 kg)     She reports good compliance with treatment, although she forgot to increase the topiramate to 2 tablets daily, so is still just taking one in the morning She feels that condition is Improved. She is having side effects. She states she feels a little fuzzy headed after her taking medications, but that it wears off by mid day. He is eating much less, is not hungry like she used to be and is eating healthier foods.  -----------------------------------------------------------------------------------------   Vitamin D deficiency, follow-up  Lab Results  Component Value Date   VD25OH 39.3 11/20/2019   VD25OH 34.2 09/18/2017   VD25OH 17.5 (L) 08/03/2016   CALCIUM 8.5 (L) 11/20/2019   CALCIUM 8.5 (L) 06/20/2018   CALCIUM 8.7 (L) 12/27/2017     She was last seen for vitamin D deficiency 1 months ago.  Management since that visit includes no change She reports good compliance with treatment. She is not having side effects.   Symptoms: No change in energy level No numbness or tingling No bone pain No unexplained  fracture  ---------------------------------------------------------------------------------------------------   She was also seen for iron deficient anemia. Lab Results  Component Value Date   IRON 93 11/20/2019   TIBC 377 11/20/2019   FERRITIN 21 11/20/2019    Lab Results  Component Value Date   WBC 3.5 11/20/2019   HGB 11.6 11/20/2019   HCT 35.9 11/20/2019   MCV 94 11/20/2019   PLT 215 11/20/2019    She is followed by Dr. Rogue Bussing for periodic iron infusions.   --------------------------------------------------------------------------------------------------------------     Medications: Outpatient Medications Prior to Visit  Medication Sig  . acetaminophen (TYLENOL) 500 MG tablet Take 1,000 mg by mouth every 6 (six) hours as needed for moderate pain or headache.   Marland Kitchen amitriptyline (ELAVIL) 50 MG tablet Take 50 mg by mouth at bedtime as needed for sleep.  . Calcium-Magnesium-Vitamin D (CALCIUM 500 PO) Take 500 mg by mouth 2 (two) times daily.  . CELLULOSE PO Take 1 tablet by mouth 3 (three) times daily.   . Magnesium 400 MG TABS Take 400 mg by mouth daily.  . phentermine 15 MG capsule Take 1 capsule (15 mg total) by mouth every morning.  . Probiotic CAPS Take 1 capsule by mouth daily.  Marland Kitchen topiramate (TOPAMAX) 50 MG tablet Take 2 tablets (100 mg total) by mouth daily.  . Vitamin D, Ergocalciferol, (DRISDOL) 1.25 MG (50000 UNIT) CAPS capsule TAKE 1 CAPSULE ONCE A WEEK   No facility-administered medications prior to visit.    Review of Systems  Constitutional: Negative for appetite change, chills, fatigue and fever.  Respiratory: Negative for chest tightness and shortness of breath.   Cardiovascular: Negative for chest pain and palpitations.  Gastrointestinal: Negative for abdominal pain, nausea and vomiting.  Neurological: Negative for dizziness and weakness.      Objective    BP 125/73 (BP Location: Right Arm, Patient Position: Sitting, Cuff Size: Normal)   Pulse  65   Temp (!) 96.9 F (36.1 C) (Temporal)   Wt 155 lb 9.6 oz (70.6 kg)   BMI 26.71 kg/m    Physical Exam   General appearance:  Overweight female, cooperative and in no acute distress Head: Normocephalic, without obvious abnormality, atraumatic Respiratory: Respirations even and unlabored, normal respiratory rate Extremities: All extremities are intact.  Skin: Skin color, texture, turgor normal. No rashes seen  Psych: Appropriate mood and affect. Neurologic: Mental status: Alert, oriented to person, place, and time, thought content appropriate.   No results found for any visits on 12/25/19.  Assessment & Plan     1. Overweight Doing well with initiation of phentermine and topiramate with 10 pound wt loss in the last month. Target weight is 140 lb. She can titrate up topiramate as tolerated to 2 tablets daily and will follow up in 3 months.  - topiramate (TOPAMAX) 50 MG tablet; Take 2 tablets (100 mg total) by mouth daily.  Dispense: 180 tablet; Refill: 1 - phentermine 15 MG capsule; Take 1 capsule (15 mg total) by mouth every morning.  Dispense: 90 capsule; Refill: 1  2. B12 deficiency  - cyanocobalamin ((VITAMIN B-12)) injection 1,000 mcg  3. Vitamin D deficiency Doing well with OTC vitamin d supplementation   No follow-ups on file.      The entirety of the information documented in the History of Present Illness, Review of Systems and Physical Exam were personally obtained by me. Portions of this information were initially documented by the CMA and reviewed by me for thoroughness and accuracy.      Mila Merry, MD  [facility] [phone] (phone) [phone] (fax)  [facility]

## 2019-11-20 NOTE — Patient Instructions (Signed)
.   Please review the attached list of medications and notify my office if there are any errors.   . Please bring all of your medications to every appointment so we can make sure that our medication list is the same as yours.   

## 2019-11-21 LAB — MAGNESIUM: Magnesium: 2.1 mg/dL (ref 1.6–2.3)

## 2019-11-21 LAB — LIPID PANEL
Chol/HDL Ratio: 2.3 ratio (ref 0.0–4.4)
Cholesterol, Total: 173 mg/dL (ref 100–199)
HDL: 76 mg/dL (ref >39)
LDL Chol Calc (NIH): 87 mg/dL (ref 0–99)
Triglycerides: 49 mg/dL (ref 0–149)
VLDL Cholesterol Cal: 10 mg/dL (ref 5–40)

## 2019-11-21 LAB — CBC
Hematocrit: 35.9 % (ref 34.0–46.6)
Hemoglobin: 11.6 g/dL (ref 11.1–15.9)
MCH: 30.4 pg (ref 26.6–33.0)
MCHC: 32.3 g/dL (ref 31.5–35.7)
MCV: 94 fL (ref 79–97)
Platelets: 215 10*3/uL (ref 150–450)
RBC: 3.82 x10E6/uL (ref 3.77–5.28)
RDW: 12.9 % (ref 11.7–15.4)
WBC: 3.5 10*3/uL (ref 3.4–10.8)

## 2019-11-21 LAB — TSH: TSH: 1.64 u[IU]/mL (ref 0.450–4.500)

## 2019-11-21 LAB — COMPREHENSIVE METABOLIC PANEL
ALT: 17 IU/L (ref 0–32)
AST: 20 IU/L (ref 0–40)
Albumin/Globulin Ratio: 1.3 (ref 1.2–2.2)
Albumin: 3.9 g/dL (ref 3.8–4.9)
Alkaline Phosphatase: 91 IU/L (ref 39–117)
BUN/Creatinine Ratio: 15 (ref 9–23)
BUN: 13 mg/dL (ref 6–24)
Bilirubin Total: 0.2 mg/dL (ref 0.0–1.2)
CO2: 22 mmol/L (ref 20–29)
Calcium: 8.5 mg/dL — ABNORMAL LOW (ref 8.7–10.2)
Chloride: 104 mmol/L (ref 96–106)
Creatinine, Ser: 0.88 mg/dL (ref 0.57–1.00)
GFR calc Af Amer: 87 mL/min/{1.73_m2} (ref >59)
GFR calc non Af Amer: 76 mL/min/{1.73_m2} (ref >59)
Globulin, Total: 2.9 g/dL (ref 1.5–4.5)
Glucose: 92 mg/dL (ref 65–99)
Potassium: 4.3 mmol/L (ref 3.5–5.2)
Sodium: 139 mmol/L (ref 134–144)
Total Protein: 6.8 g/dL (ref 6.0–8.5)

## 2019-11-21 LAB — VITAMIN D 25 HYDROXY (VIT D DEFICIENCY, FRACTURES): Vit D, 25-Hydroxy: 39.3 ng/mL (ref 30.0–100.0)

## 2019-11-21 LAB — IRON,TIBC AND FERRITIN PANEL
Ferritin: 21 ng/mL (ref 15–150)
Iron Saturation: 25 % (ref 15–55)
Iron: 93 ug/dL (ref 27–159)
Total Iron Binding Capacity: 377 ug/dL (ref 250–450)
UIBC: 284 ug/dL (ref 131–425)

## 2019-11-21 LAB — VITAMIN B12: Vitamin B-12: >2000 pg/mL — ABNORMAL HIGH (ref 232–1245)

## 2019-12-12 ENCOUNTER — Other Ambulatory Visit: Payer: Self-pay | Admitting: Family Medicine

## 2019-12-12 DIAGNOSIS — E663 Overweight: Secondary | ICD-10-CM

## 2019-12-12 NOTE — Telephone Encounter (Signed)
Requested medication (s) are due for refill today: no  Requested medication (s) are on the active medication list: yes  Last refill:  11/20/19  Future visit scheduled: yes  Notes to clinic:  medication not delegated to NT to RF   Requested Prescriptions  Pending Prescriptions Disp Refills   topiramate (TOPAMAX) 50 MG tablet [Pharmacy Med Name: TOPIRAMATE 50 MG TABLET] 60 tablet 0    Sig: TAKE 1/2 TABLET EVERY MORNING FOR 14 DAYS, THEN INCREASE TO 1 TABLET EVERY MORNING FOR 14 DAYS, THEN TAKE 2 TABLETS EVERY MORNING      Not Delegated - Neurology: Anticonvulsants - topiramate & zonisamide Failed - 12/12/2019  8:34 AM      Failed - This refill cannot be delegated      Passed - Cr in normal range and within 360 days    Creatinine  Date Value Ref Range Status  06/12/2013 0.75 0.60 - 1.30 mg/dL Final   Creatinine, Ser  Date Value Ref Range Status  11/20/2019 0.88 0.57 - 1.00 mg/dL Final          Passed - CO2 in normal range and within 360 days    CO2  Date Value Ref Range Status  11/20/2019 22 20 - 29 mmol/L Final   Co2  Date Value Ref Range Status  03/23/2013 24 21 - 32 mmol/L Final          Passed - Valid encounter within last 12 months    Recent Outpatient Visits           3 weeks ago B12 deficiency   [facility] Malva Limes, MD   2 years ago B12 deficiency   [facility] Malva Limes, MD   3 years ago Diastasis recti   [facility] Malva Limes, MD   3 years ago B12 deficiency   [facility] Malva Limes, MD   4 years ago Insomnia   [facility] Malva Limes, MD       Future Appointments             In 1 week [name], Demetrios Isaacs, MD [facility]   In 2 weeks Earna Coder, MD [facility]

## 2019-12-23 ENCOUNTER — Other Ambulatory Visit: Payer: BC Managed Care – PPO

## 2019-12-24 ENCOUNTER — Ambulatory Visit: Payer: BC Managed Care – PPO

## 2019-12-24 ENCOUNTER — Ambulatory Visit: Payer: BC Managed Care – PPO | Admitting: Internal Medicine

## 2019-12-25 ENCOUNTER — Encounter: Payer: Self-pay | Admitting: Family Medicine

## 2019-12-25 ENCOUNTER — Ambulatory Visit (INDEPENDENT_AMBULATORY_CARE_PROVIDER_SITE_OTHER): Payer: BC Managed Care – PPO | Admitting: Family Medicine

## 2019-12-25 ENCOUNTER — Other Ambulatory Visit: Payer: Self-pay

## 2019-12-25 VITALS — BP 125/73 | HR 65 | Temp 96.9°F | Wt 155.6 lb

## 2019-12-25 DIAGNOSIS — E538 Deficiency of other specified B group vitamins: Secondary | ICD-10-CM | POA: Diagnosis not present

## 2019-12-25 DIAGNOSIS — E559 Vitamin D deficiency, unspecified: Secondary | ICD-10-CM

## 2019-12-25 DIAGNOSIS — E663 Overweight: Secondary | ICD-10-CM | POA: Diagnosis not present

## 2019-12-25 MED ORDER — CYANOCOBALAMIN 1000 MCG/ML IJ SOLN
1000.0000 ug | Freq: Once | INTRAMUSCULAR | Status: AC
  Administered 2019-12-25: 1000 ug via INTRAMUSCULAR

## 2019-12-25 MED ORDER — TOPIRAMATE 50 MG PO TABS: 100.0000 mg | ORAL_TABLET | Freq: Every day | ORAL | 1 refills | Status: DC

## 2019-12-25 MED ORDER — PHENTERMINE HCL 15 MG PO CAPS: 15.0000 mg | ORAL_CAPSULE | ORAL | 1 refills | Status: DC

## 2019-12-25 NOTE — Progress Notes (Signed)
Trena Platt Cummings,acting as a scribe for Lelon Huh, MD.,have documented all relevant documentation on the behalf of Lelon Huh, MD,as directed by  Lelon Huh, MD while in the presence of Lelon Huh, MD.   Established patient visit   Patient: Laura Murray   DOB: [birth year]   54 y.o. Female  MRN: [id number] Visit Date: 03/18/2020  Today's healthcare provider: Lelon Huh, MD   Chief Complaint  Patient presents with   Weight Loss   Subjective    HPI Follow up for weight loss management  The patient was last seen for this June 18th, 2021. Changes made at last visit include continuing phentermine and titrating up topiramate from 1 50mg  tablets daily to 2 50mg  tablets daily.  She reports good compliance with treatment. She feels that condition is Improved. She is having side effects.   -----------------------------------------------------------------------------------------  patient is only taking medications every other day because the medications make her mind feel May. Otherwise she is tolerating well and her weight has leveled after starting at 165 in March.  Her goal it to get to 135 before her daughters wedding next may.    Wt Readings from Last 4 Encounters:  03/18/20 149 lb 9.6 oz (67.9 kg)  03/11/20 148 lb 11.2 oz (67.4 kg)  12/25/19 155 lb 9.6 oz (70.6 kg)  11/20/19 165 lb (74.8 kg)        Medications: Outpatient Medications Prior to Visit  Medication Sig   acetaminophen (TYLENOL) 500 MG tablet Take 1,000 mg by mouth every 6 (six) hours as needed for moderate pain or headache.    amitriptyline (ELAVIL) 50 MG tablet Take 50 mg by mouth at bedtime as needed for sleep.   Calcium-Magnesium-Vitamin D (CALCIUM 500 PO) Take 500 mg by mouth 2 (two) times daily.   Magnesium 400 MG TABS Take 400 mg by mouth daily.   phentermine 15 MG capsule Take 1 capsule (15 mg total) by mouth every morning.   Probiotic CAPS Take 1 capsule by mouth daily.     topiramate (TOPAMAX) 50 MG tablet TAKE 2 TABLETS BY MOUTH EVERY DAY   Vitamin D, Ergocalciferol, (DRISDOL) 1.25 MG (50000 UNIT) CAPS capsule TAKE 1 CAPSULE ONCE A WEEK   CELLULOSE PO Take 1 tablet by mouth 3 (three) times daily.  (Patient not taking: Reported on 03/18/2020)   No facility-administered medications prior to visit.       Objective    BP 116/69 (BP Location: Right Arm, Patient Position: Sitting, Cuff Size: Large)    Pulse 67    Temp 98.3 F (36.8 C) (Oral)    Ht 5\' 4"  (1.626 m)    Wt 149 lb 9.6 oz (67.9 kg)    BMI 25.68 kg/m    General appearance:  Well developed, well nourished female, cooperative and in no acute distress Head: Normocephalic, without obvious abnormality, atraumatic Respiratory: Respirations even and unlabored, normal respiratory rate Extremities: All extremities are intact.  Skin: Skin color, texture, turgor normal. No rashes seen  Psych: Appropriate mood and affect. Neurologic: Mental status: Alert, oriented to person, place, and time, thought content appropriate.     Assessment & Plan     1. Overweight Losing weight and advised she is near healthiest BMI on current medications. Advised that topiramate is most likely cause of 'fogginess' that she is experiencing. Advised to just take phentermine by itself for a few days, and if she has no side effects then resume topiramate at a lower dose   2.  Vitamin D deficiency Stable, will recheck at next follow up in about 6 months.    Flu vaccine today.      The entirety of the information documented in the History of Present Illness, Review of Systems and Physical Exam were personally obtained by me. Portions of this information were initially documented by the CMA and reviewed by me for thoroughness and accuracy.      Mila Merry, MD  [facility] [phone] (phone) [phone] (fax)  [facility]

## 2019-12-25 NOTE — Patient Instructions (Addendum)
.   Please review the attached list of medications and notify my office if there are any errors.   . You can try titrating up on the dose of the topiramate by first increasing to 1 1/2 tablet daily for about two weeks, then increase to 2 tablets daily

## 2019-12-28 ENCOUNTER — Telehealth: Payer: Self-pay | Admitting: *Deleted

## 2019-12-28 NOTE — Telephone Encounter (Signed)
Spoke with patient. Ok to cnl her lab apt for this week. Pt instructed to keep her mychart visit with Dr. Donneta Romberg this week.

## 2019-12-28 NOTE — Telephone Encounter (Signed)
Patient called reporting that she had labs drawn last month and that she wants to know if she has to have labs again on the 24th that her insurance will not pay for. Pleas advise

## 2019-12-30 ENCOUNTER — Inpatient Hospital Stay: Payer: BC Managed Care – PPO

## 2019-12-31 ENCOUNTER — Inpatient Hospital Stay: Payer: BC Managed Care – PPO | Attending: Internal Medicine | Admitting: Internal Medicine

## 2019-12-31 ENCOUNTER — Inpatient Hospital Stay: Payer: BC Managed Care – PPO

## 2019-12-31 DIAGNOSIS — D508 Other iron deficiency anemias: Secondary | ICD-10-CM | POA: Diagnosis not present

## 2019-12-31 NOTE — Progress Notes (Signed)
I connected with Birdie Riddle on 12/31/19 at 11:00 AM EDT by video enabled telemedicine visit and verified that I am speaking with the correct person using two identifiers.  I discussed the limitations, risks, security and privacy concerns of performing an evaluation and management service by telemedicine and the availability of in-person appointments. I also discussed with the patient that there may be a patient responsible charge related to this service. The patient expressed understanding and agreed to proceed.    Other persons participating in the visit and their role in the encounter: RN/medical reconciliation Patient's location: home  Provider's location: work  Oncology History   No history exists.     Chief Complaint: Iron deficient anemia/B12 deficiency   History of present illness:Laura Murray 54 y.o.  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  Patient unfortunately continues to have heavy menstrual cycles.  Otherwise denies any new shortness of breath or cough.  Appetite is good.  No weight loss.  No nausea no vomiting.  No blood in stools or black or stools.  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  Asymptomatic.  HOLD IV Feraheme today.  #B12 deficiency-June 2021> 2000; hold B12 injection  # DISPOSITION: # Follow up in 6 months-MD; few days prior- labs- cbc; b12 levels/ferritin/iron studies; possible ferrahem/b12 injection-Dr.B  Cc;Dr.[name].   Follow-up instructions:  I discussed the assessment and treatment plan with the patient.  The patient was provided an opportunity to ask questions and all were answered.  The patient agreed with the plan and demonstrated understanding of instructions.  The patient was advised  to call back or seek an in person evaluation if the symptoms worsen or if the condition fails to improve as anticipated.  Dr. Louretta Shorten [facility] 12/31/2019 11:13 AM

## 2019-12-31 NOTE — Assessment & Plan Note (Addendum)
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  Asymptomatic.  HOLD IV Feraheme today.  #B12 deficiency-June 2021> 2000; hold B12 injection  # DISPOSITION: # Follow up in 6 months-MD; few days prior- labs- cbc; b12 levels/ferritin/iron studies; possible ferrahem/b12 injection-Dr.B  Cc;Dr.[name].

## 2020-01-09 ENCOUNTER — Other Ambulatory Visit: Payer: Self-pay | Admitting: Family Medicine

## 2020-01-09 DIAGNOSIS — E663 Overweight: Secondary | ICD-10-CM

## 2020-01-09 NOTE — Telephone Encounter (Signed)
Requested medication (s) are due for refill today: no  Requested medication (s) are on the active medication list: yes  Last refill:  12/25/19  Future visit scheduled: yes  Notes to clinic:  pt want med sent to local pharmacy/ was previously sent to [facility]   Requested Prescriptions  Pending Prescriptions Disp Refills   topiramate (TOPAMAX) 50 MG tablet [Pharmacy Med Name: TOPIRAMATE 50 MG TABLET] 60 tablet     Sig: TAKE 2 TABLETS BY MOUTH EVERY DAY      Not Delegated - Neurology: Anticonvulsants - topiramate & zonisamide Failed - 01/09/2020 12:30 PM      Failed - This refill cannot be delegated      Passed - Cr in normal range and within 360 days    Creatinine  Date Value Ref Range Status  06/12/2013 0.75 0.60 - 1.30 mg/dL Final   Creatinine, Ser  Date Value Ref Range Status  11/20/2019 0.88 0.57 - 1.00 mg/dL Final          Passed - CO2 in normal range and within 360 days    CO2  Date Value Ref Range Status  11/20/2019 22 20 - 29 mmol/L Final   Co2  Date Value Ref Range Status  03/23/2013 24 21 - 32 mmol/L Final          Passed - Valid encounter within last 12 months    Recent Outpatient Visits           2 weeks ago Overweight   [facility] Malva Limes, MD   1 month ago B12 deficiency   [facility] Malva Limes, MD   2 years ago B12 deficiency   [facility] Malva Limes, MD   3 years ago Diastasis recti   [facility] Malva Limes, MD   3 years ago B12 deficiency   [facility] [name], Demetrios Isaacs, MD       Future Appointments             In 2 months [name], Demetrios Isaacs, MD [facility], [facility]

## 2020-01-11 ENCOUNTER — Other Ambulatory Visit: Payer: Self-pay | Admitting: Family Medicine

## 2020-01-12 ENCOUNTER — Encounter: Payer: Self-pay | Admitting: Family Medicine

## 2020-01-12 NOTE — Telephone Encounter (Signed)
Requested medication (s) are due for refill today: yes  Requested medication (s) are on the active medication list: yes  Last refill:  10/19/19  Future visit scheduled: yes  Notes to clinic:  not delegated    Requested Prescriptions  Pending Prescriptions Disp Refills   Vitamin D, Ergocalciferol, (DRISDOL) 1.25 MG (50000 UNIT) CAPS capsule [Pharmacy Med Name: VIT D-2 CAP(ERGOCAL)1.25MG  50,000U] 12 capsule 3    Sig: TAKE 1 CAPSULE ONCE A WEEK      Endocrinology:  Vitamins - Vitamin D Supplementation Failed - 01/11/2020 12:27 AM      Failed - 50,000 IU strengths are not delegated      Failed - Ca in normal range and within 360 days    Calcium  Date Value Ref Range Status  11/20/2019 8.5 (L) 8.7 - 10.2 mg/dL Final   Calcium, Total  Date Value Ref Range Status  03/23/2013 7.7 (L) 8.5 - 10.1 mg/dL Final          Failed - Phosphate in normal range and within 360 days    No results found for: PHOS        Passed - Vitamin D in normal range and within 360 days    Vit D, 25-Hydroxy  Date Value Ref Range Status  11/20/2019 39.3 30.0 - 100.0 ng/mL Final    Comment:    Vitamin D deficiency has been defined by the Institute of Medicine and an Endocrine Society practice guideline as a level of serum 25-OH vitamin D less than 20 ng/mL (1,2). The Endocrine Society went on to further define vitamin D insufficiency as a level between 21 and 29 ng/mL (2). 1. IOM (Institute of Medicine). 2010. Dietary reference    intakes for calcium and D. [place]: The    [employer]. 2. Holick MF, Binkley [place], Bischoff-Ferrari HA, et al.    Evaluation, treatment, and prevention of vitamin D    deficiency: an Endocrine Society clinical practice    guideline. JCEM. 2011 Jul; 96(7):1911-30.           Passed - Valid encounter within last 12 months    Recent Outpatient Visits           2 weeks ago Overweight   [facility] Malva Limes, MD   1 month ago B12  deficiency   [facility] Malva Limes, MD   2 years ago B12 deficiency   [facility] Malva Limes, MD   3 years ago Diastasis recti   [facility] Malva Limes, MD   3 years ago B12 deficiency   [facility] Sherrie Mustache, Demetrios Isaacs, MD       Future Appointments             In 2 months [name], Demetrios Isaacs, MD [facility], [facility]

## 2020-01-22 ENCOUNTER — Ambulatory Visit (INDEPENDENT_AMBULATORY_CARE_PROVIDER_SITE_OTHER): Payer: BC Managed Care – PPO | Admitting: Family Medicine

## 2020-01-22 ENCOUNTER — Other Ambulatory Visit: Payer: Self-pay

## 2020-01-22 DIAGNOSIS — E538 Deficiency of other specified B group vitamins: Secondary | ICD-10-CM | POA: Diagnosis not present

## 2020-01-22 MED ORDER — CYANOCOBALAMIN 1000 MCG/ML IJ SOLN
1000.0000 ug | Freq: Once | INTRAMUSCULAR | Status: AC
  Administered 2020-01-22: 1000 ug via INTRAMUSCULAR

## 2020-01-22 NOTE — Progress Notes (Signed)
Vaccine administration only. No E&M service today.   

## 2020-03-04 ENCOUNTER — Ambulatory Visit: Payer: BC Managed Care – PPO | Admitting: Adult Health

## 2020-03-04 ENCOUNTER — Ambulatory Visit: Payer: BC Managed Care – PPO | Admitting: Physician Assistant

## 2020-03-11 ENCOUNTER — Ambulatory Visit (INDEPENDENT_AMBULATORY_CARE_PROVIDER_SITE_OTHER): Payer: BC Managed Care – PPO | Admitting: Physician Assistant

## 2020-03-11 ENCOUNTER — Other Ambulatory Visit: Payer: Self-pay

## 2020-03-11 ENCOUNTER — Encounter: Payer: Self-pay | Admitting: Physician Assistant

## 2020-03-11 VITALS — Temp 98.8°F | Wt 148.7 lb

## 2020-03-11 DIAGNOSIS — E538 Deficiency of other specified B group vitamins: Secondary | ICD-10-CM

## 2020-03-11 MED ORDER — CYANOCOBALAMIN 1000 MCG/ML IJ SOLN
1000.0000 ug | Freq: Once | INTRAMUSCULAR | Status: AC
  Administered 2020-03-11: 1000 ug via INTRAMUSCULAR

## 2020-03-11 NOTE — Progress Notes (Signed)
     Established patient visit   Patient: Laura Murray   DOB: 06-02-1966   54 y.o. Female  MRN: [id number] Visit Date: 03/11/2020  Today's healthcare provider: Trey Sailors, PA-C   Chief Complaint  Patient presents with  . B12 Injection   Subjective    HPI  Patient received her B12 injection today in her left deltoid. Patient tolerated injection well.     Medications: Outpatient Medications Prior to Visit  Medication Sig  . acetaminophen (TYLENOL) 500 MG tablet Take 1,000 mg by mouth every 6 (six) hours as needed for moderate pain or headache.   Marland Kitchen amitriptyline (ELAVIL) 50 MG tablet Take 50 mg by mouth at bedtime as needed for sleep.  . Calcium-Magnesium-Vitamin D (CALCIUM 500 PO) Take 500 mg by mouth 2 (two) times daily.  . CELLULOSE PO Take 1 tablet by mouth 3 (three) times daily.   . Magnesium 400 MG TABS Take 400 mg by mouth daily.  . phentermine 15 MG capsule Take 1 capsule (15 mg total) by mouth every morning.  . Probiotic CAPS Take 1 capsule by mouth daily.  Marland Kitchen topiramate (TOPAMAX) 50 MG tablet TAKE 2 TABLETS BY MOUTH EVERY DAY  . Vitamin D, Ergocalciferol, (DRISDOL) 1.25 MG (50000 UNIT) CAPS capsule TAKE 1 CAPSULE ONCE A WEEK   No facility-administered medications prior to visit.    Review of Systems    Objective    Temp 98.8 F (37.1 C) (Oral)   Wt 148 lb 11.2 oz (67.4 kg)   BMI 25.52 kg/m    Physical Exam    No results found for any visits on 03/11/20.  Assessment & Plan    1. B12 deficiency  - cyanocobalamin ((VITAMIN B-12)) injection 1,000 mcg    Return if symptoms worsen or fail to improve.      Patient here for B12 injection only.  I did not examine the patient.  I did review his medical history, medications, and allergies and vaccine consent form.  CMA gave vaccination. Patient tolerated well.  [name],  [facility] 03/11/2020 12:26 PM     Ricki Rodriguez Kathleene Hazel  [facility] [phone] (phone) [phone] (fax)  [facility]

## 2020-03-18 ENCOUNTER — Other Ambulatory Visit: Payer: Self-pay

## 2020-03-18 ENCOUNTER — Encounter: Payer: Self-pay | Admitting: Family Medicine

## 2020-03-18 ENCOUNTER — Ambulatory Visit (INDEPENDENT_AMBULATORY_CARE_PROVIDER_SITE_OTHER): Payer: BC Managed Care – PPO | Admitting: Family Medicine

## 2020-03-18 VITALS — BP 116/69 | HR 67 | Temp 98.3°F | Ht 64.0 in | Wt 149.6 lb

## 2020-03-18 DIAGNOSIS — Z23 Encounter for immunization: Secondary | ICD-10-CM

## 2020-03-18 DIAGNOSIS — E663 Overweight: Secondary | ICD-10-CM | POA: Diagnosis not present

## 2020-03-18 DIAGNOSIS — E559 Vitamin D deficiency, unspecified: Secondary | ICD-10-CM

## 2020-03-18 NOTE — Patient Instructions (Addendum)
   Try taking the phentermine by itself for a few days to see which medication is causing side effects. If the side effects are coming from topiramate, then you could resume topiramate but taking just one tablet a day or every other day

## 2020-03-21 ENCOUNTER — Other Ambulatory Visit: Payer: Self-pay | Admitting: Ophthalmology

## 2020-03-21 DIAGNOSIS — G4452 New daily persistent headache (NDPH): Secondary | ICD-10-CM

## 2020-04-01 ENCOUNTER — Ambulatory Visit
Admission: RE | Admit: 2020-04-01 | Discharge: 2020-04-01 | Disposition: A | Discharge status: Home / Self Care | Payer: BC Managed Care – PPO | Source: Ambulatory Visit | Attending: Ophthalmology | Admitting: Ophthalmology

## 2020-04-01 ENCOUNTER — Other Ambulatory Visit: Payer: Self-pay

## 2020-04-01 DIAGNOSIS — G4452 New daily persistent headache (NDPH): Secondary | ICD-10-CM | POA: Diagnosis not present

## 2020-04-01 MED ORDER — IOHEXOL 300 MG/ML  SOLN
75.0000 mL | Freq: Once | INTRAMUSCULAR | Status: AC | PRN
  Administered 2020-04-01: 75 mL via INTRAVENOUS

## 2020-04-08 ENCOUNTER — Other Ambulatory Visit: Payer: Self-pay

## 2020-04-08 ENCOUNTER — Ambulatory Visit (INDEPENDENT_AMBULATORY_CARE_PROVIDER_SITE_OTHER): Payer: BC Managed Care – PPO | Admitting: Physician Assistant

## 2020-04-08 DIAGNOSIS — E538 Deficiency of other specified B group vitamins: Secondary | ICD-10-CM

## 2020-04-08 MED ORDER — CYANOCOBALAMIN 1000 MCG/ML IJ SOLN
1000.0000 ug | Freq: Once | INTRAMUSCULAR | Status: AC
  Administered 2020-04-08: 1000 ug via INTRAMUSCULAR

## 2020-04-08 NOTE — Progress Notes (Signed)
Pt presents today for Vit B12 injection only. Feels well with no complaints. Administered with 25g 1in needle. Pt tolerated injection well. She is to return in 1 month for next injection. Appt scheduled.

## 2020-04-12 ENCOUNTER — Other Ambulatory Visit: Payer: Self-pay | Admitting: Obstetrics & Gynecology

## 2020-04-12 DIAGNOSIS — Z1231 Encounter for screening mammogram for malignant neoplasm of breast: Secondary | ICD-10-CM

## 2020-04-12 LAB — HM PAP SMEAR

## 2020-05-13 ENCOUNTER — Other Ambulatory Visit: Payer: Self-pay

## 2020-05-13 ENCOUNTER — Ambulatory Visit
Admission: RE | Admit: 2020-05-13 | Discharge: 2020-05-13 | Disposition: A | Discharge status: Home / Self Care | Payer: BC Managed Care – PPO | Source: Ambulatory Visit | Attending: Obstetrics & Gynecology | Admitting: Obstetrics & Gynecology

## 2020-05-13 ENCOUNTER — Ambulatory Visit (INDEPENDENT_AMBULATORY_CARE_PROVIDER_SITE_OTHER): Payer: BC Managed Care – PPO | Admitting: Physician Assistant

## 2020-05-13 DIAGNOSIS — E538 Deficiency of other specified B group vitamins: Secondary | ICD-10-CM

## 2020-05-13 DIAGNOSIS — Z1231 Encounter for screening mammogram for malignant neoplasm of breast: Secondary | ICD-10-CM | POA: Insufficient documentation

## 2020-05-13 MED ORDER — CYANOCOBALAMIN 1000 MCG/ML IJ SOLN
1000.0000 ug | Freq: Once | INTRAMUSCULAR | Status: AC
  Administered 2020-05-13: 1000 ug via INTRAMUSCULAR

## 2020-05-13 NOTE — Progress Notes (Signed)
     Established patient visit   Patient: Laura Murray   DOB: [birth year]   54 y.o. Female  MRN: [id number] Visit Date: 05/13/2020  Today's healthcare provider: Trey Sailors, PA-C   No chief complaint on file.  Subjective    HPI  Patient presents today for B-12 injection. Patient reports she feels well with no complaints. Patient tolerated injection well and will return in a month for next injection.     Medications: Outpatient Medications Prior to Visit  Medication Sig  . acetaminophen (TYLENOL) 500 MG tablet Take 1,000 mg by mouth every 6 (six) hours as needed for moderate pain or headache.   Marland Kitchen amitriptyline (ELAVIL) 50 MG tablet Take 50 mg by mouth at bedtime as needed for sleep.  . Calcium-Magnesium-Vitamin D (CALCIUM 500 PO) Take 500 mg by mouth 2 (two) times daily.  . CELLULOSE PO Take 1 tablet by mouth 3 (three) times daily.  (Patient not taking: Reported on 03/18/2020)  . Magnesium 400 MG TABS Take 400 mg by mouth daily.  . phentermine 15 MG capsule Take 1 capsule (15 mg total) by mouth every morning.  . Probiotic CAPS Take 1 capsule by mouth daily.  Marland Kitchen topiramate (TOPAMAX) 50 MG tablet TAKE 2 TABLETS BY MOUTH EVERY DAY  . Vitamin D, Ergocalciferol, (DRISDOL) 1.25 MG (50000 UNIT) CAPS capsule TAKE 1 CAPSULE ONCE A WEEK   No facility-administered medications prior to visit.    Review of Systems    Objective    There were no vitals taken for this visit.   Physical Exam    No results found for any visits on 05/13/20.  Assessment & Plan     Patient here for B12 shot only.  I did not examine the patient.  I did review his medical history, medications, and allergies and vaccine consent form.  CMA gave vaccination. Patient tolerated well.  [name] [facility] 05/18/2020 2:04 PM    No follow-ups on file.        [name]  [facility] [phone] (phone) [phone] (fax)  [facility]

## 2020-06-10 ENCOUNTER — Other Ambulatory Visit: Payer: Self-pay

## 2020-06-10 ENCOUNTER — Ambulatory Visit (INDEPENDENT_AMBULATORY_CARE_PROVIDER_SITE_OTHER): Payer: BC Managed Care – PPO | Admitting: Family Medicine

## 2020-06-10 DIAGNOSIS — E538 Deficiency of other specified B group vitamins: Secondary | ICD-10-CM

## 2020-06-10 MED ORDER — CYANOCOBALAMIN 1000 MCG/ML IJ SOLN
1000.0000 ug | Freq: Once | INTRAMUSCULAR | Status: AC
  Administered 2020-06-10: 1000 ug via INTRAMUSCULAR

## 2020-06-10 NOTE — Progress Notes (Signed)
Administered Vit B12 injection in left deltoid IM with 25g 1in needle. Patient tolerated well. She will return in 1 month for next injection.

## 2020-06-10 NOTE — Patient Instructions (Signed)
.   Please review the attached list of medications and notify my office if there are any errors.   . Please bring all of your medications to every appointment so we can make sure that our medication list is the same as yours.   

## 2020-06-21 ENCOUNTER — Other Ambulatory Visit: Payer: Self-pay

## 2020-06-21 ENCOUNTER — Other Ambulatory Visit: Payer: Self-pay | Admitting: Internal Medicine

## 2020-06-21 ENCOUNTER — Encounter: Payer: Self-pay | Admitting: Family Medicine

## 2020-06-21 ENCOUNTER — Inpatient Hospital Stay: Payer: BC Managed Care – PPO | Attending: Internal Medicine

## 2020-06-21 DIAGNOSIS — Z833 Family history of diabetes mellitus: Secondary | ICD-10-CM | POA: Insufficient documentation

## 2020-06-21 DIAGNOSIS — D500 Iron deficiency anemia secondary to blood loss (chronic): Secondary | ICD-10-CM | POA: Diagnosis present

## 2020-06-21 DIAGNOSIS — Z801 Family history of malignant neoplasm of trachea, bronchus and lung: Secondary | ICD-10-CM | POA: Insufficient documentation

## 2020-06-21 DIAGNOSIS — Z8261 Family history of arthritis: Secondary | ICD-10-CM | POA: Diagnosis not present

## 2020-06-21 DIAGNOSIS — Z8249 Family history of ischemic heart disease and other diseases of the circulatory system: Secondary | ICD-10-CM | POA: Insufficient documentation

## 2020-06-21 DIAGNOSIS — Z9884 Bariatric surgery status: Secondary | ICD-10-CM | POA: Insufficient documentation

## 2020-06-21 DIAGNOSIS — E538 Deficiency of other specified B group vitamins: Secondary | ICD-10-CM | POA: Diagnosis present

## 2020-06-21 DIAGNOSIS — Z803 Family history of malignant neoplasm of breast: Secondary | ICD-10-CM | POA: Insufficient documentation

## 2020-06-21 LAB — CBC WITH DIFFERENTIAL/PLATELET
Abs Immature Granulocytes: 0.01 10*3/uL (ref 0.00–0.07)
Basophils Absolute: 0 10*3/uL (ref 0.0–0.1)
Basophils Relative: 0 %
Eosinophils Absolute: 0.1 10*3/uL (ref 0.0–0.5)
Eosinophils Relative: 2 %
HCT: 36.2 % (ref 36.0–46.0)
Hemoglobin: 11.7 g/dL — ABNORMAL LOW (ref 12.0–15.0)
Immature Granulocytes: 0 %
Lymphocytes Relative: 24 %
Lymphs Abs: 1.3 10*3/uL (ref 0.7–4.0)
MCH: 29.5 pg (ref 26.0–34.0)
MCHC: 32.3 g/dL (ref 30.0–36.0)
MCV: 91.4 fL (ref 80.0–100.0)
Monocytes Absolute: 0.3 10*3/uL (ref 0.1–1.0)
Monocytes Relative: 7 %
Neutro Abs: 3.4 10*3/uL (ref 1.7–7.7)
Neutrophils Relative %: 67 %
Platelets: 291 10*3/uL (ref 150–400)
RBC: 3.96 MIL/uL (ref 3.87–5.11)
RDW: 12.9 % (ref 11.5–15.5)
WBC: 5.1 10*3/uL (ref 4.0–10.5)
nRBC: 0 % (ref 0.0–0.2)

## 2020-06-21 LAB — IRON AND TIBC
Iron: 105 ug/dL (ref 28–170)
Saturation Ratios: 21 % (ref 10.4–31.8)
TIBC: 512 ug/dL — ABNORMAL HIGH (ref 250–450)
UIBC: 407 ug/dL

## 2020-06-21 LAB — VITAMIN B12: Vitamin B-12: 426 pg/mL (ref 180–914)

## 2020-06-21 LAB — FERRITIN: Ferritin: 12 ng/mL (ref 11–307)

## 2020-06-22 ENCOUNTER — Inpatient Hospital Stay: Payer: BC Managed Care – PPO

## 2020-06-23 ENCOUNTER — Other Ambulatory Visit: Payer: Self-pay

## 2020-06-23 DIAGNOSIS — D508 Other iron deficiency anemias: Secondary | ICD-10-CM

## 2020-06-23 DIAGNOSIS — D500 Iron deficiency anemia secondary to blood loss (chronic): Secondary | ICD-10-CM

## 2020-06-23 DIAGNOSIS — I7300 Raynaud's syndrome without gangrene: Secondary | ICD-10-CM

## 2020-06-24 ENCOUNTER — Inpatient Hospital Stay: Payer: BC Managed Care – PPO

## 2020-06-24 ENCOUNTER — Other Ambulatory Visit: Payer: Self-pay

## 2020-06-24 ENCOUNTER — Inpatient Hospital Stay (HOSPITAL_BASED_OUTPATIENT_CLINIC_OR_DEPARTMENT_OTHER): Payer: BC Managed Care – PPO | Admitting: Internal Medicine

## 2020-06-24 DIAGNOSIS — D508 Other iron deficiency anemias: Secondary | ICD-10-CM

## 2020-06-24 DIAGNOSIS — D500 Iron deficiency anemia secondary to blood loss (chronic): Secondary | ICD-10-CM | POA: Diagnosis not present

## 2020-06-24 NOTE — Assessment & Plan Note (Addendum)
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  Asymptomatic.  HOLD IV Venofer today [Fin].  #B12 deficiency-Dec 2021- 630- hold B12 injection  # DISPOSITION: # Follow up in 6 months-MD; few days prior- labs- cbc; b12 levels/ferritin/iron studies; possible venofer/b12 injection-Dr.B  Cc;Dr.[name].

## 2020-06-24 NOTE — Progress Notes (Signed)
[facility] OFFICE PROGRESS NOTE  Patient Care Team: Malva Limes, MD as PCP - General (Family Medicine) Marin Roberts, MD as Referring Physician (Internal Medicine) Lemar Livings Merrily Pew, MD as Consulting Physician (General Surgery) Schermerhorn, Ihor Austin, MD as Referring Physician (Obstetrics and Gynecology)   SUMMARY OF ONCOLOGIC HISTORY:  # IDA [place] Breeding to gastric Bypass 2002/ menstrual blood loss]; IV iron q 6 M/ B12 IM [thru PCP]; B12 def-   # 2019- ? raynauds' s/p rheuma eval.   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  Patient denies any blood in stools or black or stools.  Denies any worsening fatigue.  Review of Systems  Constitutional: Negative for chills, diaphoresis, fever and weight loss.  HENT: Negative for nosebleeds and sore throat.   Eyes: Negative for double vision.  Respiratory: Negative for cough, hemoptysis, sputum production, shortness of breath and wheezing.   Cardiovascular: Negative for chest pain, palpitations, orthopnea and leg swelling.  Gastrointestinal: Negative for abdominal pain, blood in stool, constipation, diarrhea, heartburn, melena, nausea and vomiting.  Genitourinary: Negative for dysuria, frequency and urgency.  Musculoskeletal: Negative for back pain and joint pain.  Skin: Negative.  Negative for itching and rash.  Neurological: Negative for dizziness, tingling, focal weakness, weakness and headaches.  Endo/Heme/Allergies: Does not bruise/bleed easily.  Psychiatric/Behavioral: Negative for depression. The patient has insomnia. The patient is not nervous/anxious.    Marland Kitchen   PAST MEDICAL HISTORY :  Past Medical History:  Diagnosis Date  . Anemia   . Insomnia   . Obesity   . Ovarian cyst   . Upper GI bleeding 2015   gastric ulcer    PAST SURGICAL HISTORY :   Past Surgical History:  Procedure  Laterality Date  . BREAST CYST ASPIRATION Left 02/02/2014   Dr Lemar Livings  . BREAST CYST ASPIRATION Right 02/2018  . CHOLECYSTECTOMY  2009  . COLONOSCOPY  2014  . GASTRIC BYPASS  2006, 2015   2015 revision  . GASTRIC RESECTION  2015  . SHOULDER ARTHROSCOPY WITH OPEN ROTATOR CUFF REPAIR Right 03/27/2018   Procedure: SHOULDER ARTHROSCOPY WITH OPEN ROTATOR CUFF REPAIR;  Surgeon: Christena Flake, MD;  Location: [facility];  Service: Orthopedics;  Laterality: Right;  . SHOULDER CLOSED REDUCTION Right 07/08/2018   Procedure: CLOSED MANIPULATION SHOULDER;  Surgeon: Christena Flake, MD;  Location: [facility];  Service: Orthopedics;  Laterality: Right;  . STERIOD INJECTION Right 07/08/2018   Procedure: STEROID INJECTION OF RIGHT SHOULDER;  Surgeon: Christena Flake, MD;  Location: [facility];  Service: Orthopedics;  Laterality: Right;  . TONSILLECTOMY    . TUBAL LIGATION    . UPPER GI ENDOSCOPY      FAMILY HISTORY :   Family History  Problem Relation Age of Onset  . Lung cancer Maternal Grandfather   . Cancer Maternal Grandfather   . Diabetes Mother   . Hypertension Mother   . Diabetes Father   . Hypertension Father   . Crohn's disease Son   . Rheum arthritis Son   . Asthma Son   . Cancer Maternal Grandmother   . Breast cancer Maternal Aunt        [id number]  . Colon cancer Maternal Uncle     SOCIAL HISTORY:   Social History   Tobacco Use  . Smoking status: Never Smoker  . Smokeless tobacco: Never Used  Substance Use Topics  .  Alcohol use: No  . Drug use: No    ALLERGIES:  is allergic to nsaids.  MEDICATIONS:  Current Outpatient Medications  Medication Sig Dispense Refill  . acetaminophen (TYLENOL) 500 MG tablet Take 1,000 mg by mouth every 6 (six) hours as needed for moderate pain or headache.     Marland Kitchen amitriptyline (ELAVIL) 50 MG tablet Take 50 mg by mouth at bedtime as needed for sleep.    . Calcium-Magnesium-Vitamin D (CALCIUM 500 PO) Take 500 mg by mouth 2 (two) times daily.    .  CYANOCOBALAMIN IJ Inject as directed.    . Magnesium 400 MG TABS Take 400 mg by mouth daily.    . Probiotic CAPS Take 1 capsule by mouth daily.    . Vitamin D, Ergocalciferol, (DRISDOL) 1.25 MG (50000 UNIT) CAPS capsule TAKE 1 CAPSULE ONCE A WEEK 12 capsule 0   No current facility-administered medications for this visit.    PHYSICAL EXAMINATION: ECOG PERFORMANCE STATUS: 0 - Asymptomatic  BP (!) 154/57 (BP Location: Left Arm, Patient Position: Sitting)   Pulse 68   Temp 98 F (36.7 C) (Tympanic)   Wt 150 lb (68 kg)   SpO2 100%   BMI 25.75 kg/m   Filed Weights   06/24/20 1309  Weight: 150 lb (68 kg)    Physical Exam HENT:     Head: Normocephalic and atraumatic.     Mouth/Throat:     Pharynx: No oropharyngeal exudate.  Eyes:     Pupils: Pupils are equal, round, and reactive to light.  Cardiovascular:     Rate and Rhythm: Normal rate and regular rhythm.  Pulmonary:     Effort: No respiratory distress.     Breath sounds: No wheezing.  Abdominal:     General: Bowel sounds are normal. There is no distension.     Palpations: Abdomen is soft. There is no mass.     Tenderness: There is no abdominal tenderness. There is no guarding or rebound.  Musculoskeletal:        General: No tenderness. Normal range of motion.     Cervical back: Normal range of motion and neck supple.  Skin:    General: Skin is warm.  Neurological:     Mental Status: She is alert and oriented to person, place, and time.  Psychiatric:        Mood and Affect: Affect normal.     LABORATORY DATA:  I have reviewed the data as listed    Component Value Date/Time   NA 139 11/20/2019 0859   NA 142 03/23/2013 0428   K 4.3 11/20/2019 0859   K 4.1 06/29/2013 0902   CL 104 11/20/2019 0859   CL 112 (H) 03/23/2013 0428   CO2 22 11/20/2019 0859   CO2 24 03/23/2013 0428   GLUCOSE 92 11/20/2019 0859   GLUCOSE 104 (H) 06/20/2018 1509   GLUCOSE 90 03/23/2013 0428   BUN 13 11/20/2019 0859   BUN 5 (L)  03/23/2013 0428   CREATININE 0.88 11/20/2019 0859   CREATININE 0.75 06/12/2013 0842   CALCIUM 8.5 (L) 11/20/2019 0859   CALCIUM 7.7 (L) 03/23/2013 0428   PROT 6.8 11/20/2019 0859   PROT 6.1 (L) 03/20/2013 1843   ALBUMIN 3.9 11/20/2019 0859   ALBUMIN 3.0 (L) 03/20/2013 1843   AST 20 11/20/2019 0859   AST 25 03/20/2013 1843   ALT 17 11/20/2019 0859   ALT 49 03/20/2013 1843   ALKPHOS 91 11/20/2019 0859   ALKPHOS 84 03/20/2013 1843  BILITOT 0.2 11/20/2019 0859   BILITOT 0.1 (L) 03/20/2013 1843   GFRNONAA 76 11/20/2019 0859   GFRNONAA >60 06/12/2013 0842   GFRAA 87 11/20/2019 0859   GFRAA >60 06/12/2013 0842    No results found for: SPEP, UPEP  Lab Results  Component Value Date   WBC 5.1 06/21/2020   NEUTROABS 3.4 06/21/2020   HGB 11.7 (L) 06/21/2020   HCT 36.2 06/21/2020   MCV 91.4 06/21/2020   PLT 291 06/21/2020      Chemistry      Component Value Date/Time   NA 139 11/20/2019 0859   NA 142 03/23/2013 0428   K 4.3 11/20/2019 0859   K 4.1 06/29/2013 0902   CL 104 11/20/2019 0859   CL 112 (H) 03/23/2013 0428   CO2 22 11/20/2019 0859   CO2 24 03/23/2013 0428   BUN 13 11/20/2019 0859   BUN 5 (L) 03/23/2013 0428   CREATININE 0.88 11/20/2019 0859   CREATININE 0.75 06/12/2013 0842   GLU 82 10/16/2013 0000      Component Value Date/Time   CALCIUM 8.5 (L) 11/20/2019 0859   CALCIUM 7.7 (L) 03/23/2013 0428   ALKPHOS 91 11/20/2019 0859   ALKPHOS 84 03/20/2013 1843   AST 20 11/20/2019 0859   AST 25 03/20/2013 1843   ALT 17 11/20/2019 0859   ALT 49 03/20/2013 1843   BILITOT 0.2 11/20/2019 0859   BILITOT 0.1 (L) 03/20/2013 1843        ASSESSMENT & PLAN:  Acquired iron deficiency anemia due to decreased absorption # IDA- secondary to gastric bypass/menstrual blood loss; Hemoglobin is 11.6; sat-25%.  Asymptomatic.  HOLD IV Venofer today [Fin].  #B12 deficiency-Dec 2021- 630- hold B12 injection  # DISPOSITION: # Follow up in 6 months-MD; few days prior- labs-  cbc; b12 levels/ferritin/iron studies; possible venofer/b12 injection-Dr.B  Cc;Dr.[name].      Earna Coder, MD 06/24/2020 1:58 PM

## 2020-07-22 ENCOUNTER — Ambulatory Visit: Payer: BC Managed Care – PPO | Admitting: Family Medicine

## 2020-09-12 ENCOUNTER — Telehealth: Payer: Self-pay

## 2020-09-12 NOTE — Telephone Encounter (Signed)
Copied from CRM [id number]. Topic: General - Other >> Sep 12, 2020 12:41 PM Wyonia Hough E wrote: Reason for CRM: Pt returned Carol's call/ please advise / please call work number

## 2020-09-16 ENCOUNTER — Ambulatory Visit: Payer: BC Managed Care – PPO | Admitting: Family Medicine

## 2020-10-06 NOTE — Progress Notes (Signed)
Established patient visit   Patient: Laura Murray   DOB: [birth year]   55 y.o. Female  MRN: [id number] Visit Date: 10/07/2020  Today's healthcare provider: Mila Merry, MD   Chief Complaint  Patient presents with  . Weight Check   Subjective    HPI  Follow up for overweight:  The patient was last seen for this 6 months ago. Changes made at last visit include advising patient to just take phentermine by itself for a few days, and if she has no side effects then to resume topiramate at a lower dose .  She reports fair compliance with treatment. Patient stopped taking medications in December 2021, and restarted medication 1 month ago. She is only taking one tablet daily of Topamax.  She feels that condition is Improved. She is having side effects. Patient states the medication makes her feel foggy headed when she takes 2 tablets of Topamax.   She only take medications intermittently, and recently started since she has a wedding coming up.  -----------------------------------------------------------------------------------------  Follow up for Vitamin D Deficiency:  The patient was last seen for this 6 months ago. Changes made at last visit include none; continue vitamin D supplement. Will recheck levels at the next follow up visit.   She reports good compliance with treatment. She feels that condition is Unchanged. She is not having side effects.   -----------------------------------------------------------------------------------------   Past Surgical History:  Procedure Laterality Date  . BREAST CYST ASPIRATION Left 02/02/2014   Dr Lemar Livings  . BREAST CYST ASPIRATION Right 02/2018  . CHOLECYSTECTOMY  2009  . COLONOSCOPY  2014  . GASTRIC BYPASS  2006, 2015   2015 revision  . GASTRIC RESECTION  2015  . SHOULDER ARTHROSCOPY WITH OPEN ROTATOR CUFF REPAIR Right 03/27/2018   Procedure: SHOULDER ARTHROSCOPY WITH OPEN ROTATOR CUFF REPAIR;  Surgeon: Christena Flake,  MD;  Location: [facility];  Service: Orthopedics;  Laterality: Right;  . SHOULDER CLOSED REDUCTION Right 07/08/2018   Procedure: CLOSED MANIPULATION SHOULDER;  Surgeon: Christena Flake, MD;  Location: [facility];  Service: Orthopedics;  Laterality: Right;  . STERIOD INJECTION Right 07/08/2018   Procedure: STEROID INJECTION OF RIGHT SHOULDER;  Surgeon: Christena Flake, MD;  Location: [facility];  Service: Orthopedics;  Laterality: Right;  . TONSILLECTOMY    . TUBAL LIGATION    . UPPER GI ENDOSCOPY         Medications: Outpatient Medications Prior to Visit  Medication Sig  . acetaminophen (TYLENOL) 500 MG tablet Take 1,000 mg by mouth every 6 (six) hours as needed for moderate pain or headache.   Marland Kitchen amitriptyline (ELAVIL) 50 MG tablet Take 50 mg by mouth at bedtime as needed for sleep.  . Calcium-Magnesium-Vitamin D (CALCIUM 500 PO) Take 500 mg by mouth 2 (two) times daily.  . CYANOCOBALAMIN IJ Inject as directed.  . Magnesium 400 MG TABS Take 400 mg by mouth daily.  . phentermine 15 MG capsule Take 15 mg by mouth every morning.  . Probiotic CAPS Take 1 capsule by mouth daily.  Marland Kitchen topiramate (TOPAMAX) 50 MG tablet Take 50 mg by mouth daily.  . Vitamin D, Ergocalciferol, (DRISDOL) 1.25 MG (50000 UNIT) CAPS capsule TAKE 1 CAPSULE ONCE A WEEK   No facility-administered medications prior to visit.    Review of Systems  Constitutional: Negative for appetite change, chills, fatigue and fever.  Respiratory: Negative for chest tightness and shortness of breath.   Cardiovascular: Negative for chest pain and palpitations.  Gastrointestinal: Negative for abdominal pain, nausea and vomiting.  Neurological: Negative for dizziness and weakness.       Objective    BP 123/74 (BP Location: Left Arm, Patient Position: Sitting, Cuff Size: Normal)   Pulse 60   Temp (!) 97.5 F (36.4 C) (Temporal)   Resp 16   Ht 5\' 4"  (1.626 m)   Wt 144 lb 12.8 oz (65.7 kg)   BMI 24.85 kg/m     Physical Exam    General: Appearance:    Well developed, well nourished female in no acute distress  Eyes:    PERRL, conjunctiva/corneas clear, EOM's intact       Lungs:     Clear to auscultation bilaterally, respirations unlabored  Heart:    Normal heart rate. Normal rhythm. No murmurs, rubs, or gallops.   MS:   All extremities are intact.   Neurologic:   Awake, alert, oriented x 3. No apparent focal neurological           defect.          Assessment & Plan     1. Status post bariatric surgery Continue follow up hematology for iron management.   2. Encounter for weight management Doing well with intermittent use of phentermine and low dose topiramate (50mg ) which she tolerates well.   3. Vitamin D deficiency  - Vitamin D, Ergocalciferol, (DRISDOL) 1.25 MG (50000 UNIT) CAPS capsule; TAKE 1 CAPSULE ONCE A WEEK  Dispense: 12 capsule; Refill: 4 - VITAMIN D 25 Hydroxy (Vit-D Deficiency, Fractures)  4. B12 deficiency B12 injection today.   5. Primary insomnia Taking amitriptyline about twice a week which remains effective.        The entirety of the information documented in the History of Present Illness, Review of Systems and Physical Exam were personally obtained by me. Portions of this information were initially documented by the CMA and reviewed by me for thoroughness and accuracy.      , MD  [facility] [phone] (phone) [phone] (fax)  [facility]

## 2020-10-07 ENCOUNTER — Encounter: Payer: Self-pay | Admitting: Family Medicine

## 2020-10-07 ENCOUNTER — Other Ambulatory Visit: Payer: Self-pay

## 2020-10-07 ENCOUNTER — Ambulatory Visit (INDEPENDENT_AMBULATORY_CARE_PROVIDER_SITE_OTHER): Payer: BC Managed Care – PPO | Admitting: Family Medicine

## 2020-10-07 VITALS — BP 123/74 | HR 60 | Temp 97.5°F | Resp 16 | Ht 64.0 in | Wt 144.8 lb

## 2020-10-07 DIAGNOSIS — Z9884 Bariatric surgery status: Secondary | ICD-10-CM

## 2020-10-07 DIAGNOSIS — E538 Deficiency of other specified B group vitamins: Secondary | ICD-10-CM | POA: Diagnosis not present

## 2020-10-07 DIAGNOSIS — F5101 Primary insomnia: Secondary | ICD-10-CM

## 2020-10-07 DIAGNOSIS — Z7689 Persons encountering health services in other specified circumstances: Secondary | ICD-10-CM | POA: Diagnosis not present

## 2020-10-07 DIAGNOSIS — E559 Vitamin D deficiency, unspecified: Secondary | ICD-10-CM

## 2020-10-07 MED ORDER — VITAMIN D (ERGOCALCIFEROL) 1.25 MG (50000 UNIT) PO CAPS: ORAL_CAPSULE | ORAL | 4 refills | Status: DC

## 2020-10-07 MED ORDER — CYANOCOBALAMIN 1000 MCG/ML IJ SOLN
1000.0000 ug | Freq: Once | INTRAMUSCULAR | Status: AC
  Administered 2020-10-07: 1000 ug via INTRAMUSCULAR

## 2020-10-08 LAB — VITAMIN D 25 HYDROXY (VIT D DEFICIENCY, FRACTURES): Vit D, 25-Hydroxy: 77 ng/mL (ref 30.0–100.0)

## 2020-11-03 NOTE — Progress Notes (Signed)
I,April Miller,acting as a Neurosurgeon for [employer], PA-C.,have documented all relevant documentation on the behalf of Dortha Kern, PA-C,as directed by  [employer], PA-C while in the presence of [employer], PA-C.   Established patient visit   Patient: Laura Murray   DOB: 04-20-1966   55 y.o. Female  MRN: [id number] Visit Date: 11/04/2020  Today's healthcare provider: Dortha Kern, PA-C   Chief Complaint  Patient presents with  . Shoulder Pain   Subjective    HPI  Neck Pain  She reports new onset neck pain. There was not an injury that may have caused the pain. The most recent episode started 2 weeks ago and is gradually worsening. The pain is located in the left side of neck and shoulder radiates up the back of her nck It is described as aching, burning and stiffness, is moderate and 8/10 in intensity, occurring constantly. Symptoms are worse in the: nighttime  Aggravating factors: turning left Relieving factors: flexion.  She has tried acetaminophen with no relief.   --------------------------------------------------------------------  Patient states she has had left sided neck and shoulder pain for about 2 weeks. Pain radiates up the back of her neck, causing headaches. Patient has been treating symptoms with tylenol with no relief.   Patient also has pain in her left big toe. Patient states pain has been occurring for over one month. Patient states her toe is very painful.   Patient Active Problem List   Diagnosis Date Noted  . Acquired iron deficiency anemia due to decreased absorption 06/25/2019  . Raynaud phenomenon 02/04/2019  . Diastasis recti 03/14/2017  . Chronic diarrhea 12/20/2014  . 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 H/O: obesity 12/20/2014  . Cannot sleep 12/20/2014  . Iron deficiency anemia due to chronic blood loss 12/20/2014  . Excessive or frequent menstruation 12/20/2014  . Extreme obesity 12/20/2014  . Cyst of ovary 12/20/2014   . B12 deficiency 12/20/2014  . Vitamin D deficiency 12/20/2014  . Breast cyst 02/03/2014  . Cyst (solitary) of breast 02/03/2014   Social History   Tobacco Use  . Smoking status: Never Smoker  . Smokeless tobacco: Never Used  Substance Use Topics  . Alcohol use: No  . Drug use: No   Allergies  Allergen Reactions  . Nsaids Other (See Comments)    Avoid due to gastric bypass        Medications: Outpatient Medications Prior to Visit  Medication Sig  . acetaminophen (TYLENOL) 500 MG tablet Take 1,000 mg by mouth every 6 (six) hours as needed for moderate pain or headache.   Marland Kitchen amitriptyline (ELAVIL) 50 MG tablet Take 50 mg by mouth at bedtime as needed for sleep.  . Calcium-Magnesium-Vitamin D (CALCIUM 500 PO) Take 500 mg by mouth 2 (two) times daily.  . CYANOCOBALAMIN IJ Inject as directed.  . Magnesium 400 MG TABS Take 400 mg by mouth daily.  . phentermine 15 MG capsule Take 15 mg by mouth every morning.  . Probiotic CAPS Take 1 capsule by mouth daily.  Marland Kitchen topiramate (TOPAMAX) 50 MG tablet Take 50 mg by mouth daily.  . Vitamin D, Ergocalciferol, (DRISDOL) 1.25 MG (50000 UNIT) CAPS capsule TAKE 1 CAPSULE ONCE A WEEK   No facility-administered medications prior to visit.    Review of Systems  Constitutional: Negative for appetite change, chills, fatigue and fever.  Respiratory: Negative for chest tightness and shortness of breath.   Cardiovascular: Negative for chest pain and palpitations.  Gastrointestinal: Negative for abdominal  pain, nausea and vomiting.  Neurological: Negative for dizziness and weakness.        Objective    BP (!) 112/45 (BP Location: Right Arm, Patient Position: Sitting, Cuff Size: Large)   Pulse 81   Temp 97.9 F (36.6 C) (Oral)   Resp 16   Wt 144 lb (65.3 kg)   SpO2 95%   BMI 24.72 kg/m    Physical Exam Constitutional:      General: She is not in acute distress.    Appearance: She is well-developed.  HENT:     Head: Normocephalic  and atraumatic.     Right Ear: Hearing normal.     Left Ear: Hearing normal.     Nose: Nose normal.  Eyes:     General: Lids are normal. No scleral icterus.       Right eye: No discharge.        Left eye: No discharge.     Conjunctiva/sclera: Conjunctivae normal.  Pulmonary:     Effort: Pulmonary effort is normal. No respiratory distress.  Musculoskeletal:        General: Tenderness present. Normal range of motion.     Comments: Tender muscles along medial border of left scapula. Some soreness in posterior cervical muscles. No weakness. Tenderness of the left great toe MTP joint without redness or swelling.  Skin:    Findings: No lesion or rash.  Neurological:     Mental Status: She is alert and oriented to person, place, and time.  Psychiatric:        Speech: Speech normal.        Behavior: Behavior normal.        Thought Content: Thought content normal.       No results found for any visits on 11/04/20.  Assessment & Plan     1. Muscle strain of left shoulder, initial encounter Onset over the past few days. No specific injury known. 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 Recommend Aspercreme with Lidocaine for muscle spasms with intermittent moist heat applications and stretching exercises. May need muscle relaxant if no better with this regimen.  2. Pain of left great toe No known injury. No redness. Unable to take an oral NSAID due to gastric bypass. Check for signs of arthritis or gout. - Uric acid - DG Foot Complete Left - CBC with Differential/Platelet   No follow-ups on file.      I, Javion Holmer, PA-C, have reviewed all documentation for this visit. The documentation on 11/04/20 for the exam, diagnosis, procedures, and orders are all accurate and complete.    Dortha Kern, PA-C  [employer] [phone] (phone) [phone] (fax)  [facility]

## 2020-11-04 ENCOUNTER — Other Ambulatory Visit: Payer: Self-pay

## 2020-11-04 ENCOUNTER — Ambulatory Visit (INDEPENDENT_AMBULATORY_CARE_PROVIDER_SITE_OTHER): Payer: BC Managed Care – PPO | Admitting: Family Medicine

## 2020-11-04 ENCOUNTER — Ambulatory Visit
Admission: RE | Admit: 2020-11-04 | Discharge: 2020-11-04 | Disposition: A | Discharge status: Home / Self Care | Payer: BC Managed Care – PPO | Attending: Family Medicine | Admitting: Family Medicine

## 2020-11-04 ENCOUNTER — Telehealth: Payer: Self-pay | Admitting: Family Medicine

## 2020-11-04 ENCOUNTER — Encounter: Payer: Self-pay | Admitting: Family Medicine

## 2020-11-04 ENCOUNTER — Ambulatory Visit
Admission: RE | Admit: 2020-11-04 | Discharge: 2020-11-04 | Disposition: A | Discharge status: Home / Self Care | Payer: BC Managed Care – PPO | Source: Ambulatory Visit | Attending: Family Medicine | Admitting: Family Medicine

## 2020-11-04 VITALS — BP 112/45 | HR 81 | Temp 97.9°F | Resp 16 | Wt 144.0 lb

## 2020-11-04 DIAGNOSIS — S46912A Strain of unspecified muscle, fascia and tendon at shoulder and upper arm level, left arm, initial encounter: Secondary | ICD-10-CM

## 2020-11-04 DIAGNOSIS — M79675 Pain in left toe(s): Secondary | ICD-10-CM | POA: Insufficient documentation

## 2020-11-04 NOTE — Patient Instructions (Signed)
Muscle Strain A muscle strain is an injury that occurs when a muscle is stretched beyond its normal length. Usually, a small number of muscle fibers are torn when this happens. There are three types of muscle strains. First-degree strains have the least amount of muscle fiber tearing and the least amount of pain. Second-degree and third-degree strains have more tearing and pain. Usually, recovery from muscle strain takes 1-2 weeks. Complete healing normally takes 5-6 weeks. What are the causes? This condition is caused when a sudden, violent force is placed on a muscle and stretches it too far. This may occur with a fall, lifting, or sports. What increases the risk? This condition is more likely to develop in athletes and people who are physically active. What are the signs or symptoms? Symptoms of this condition include:  Pain.  Bruising.  Swelling.  Trouble using the muscle. How is this diagnosed? This condition is diagnosed based on a physical exam and your medical history. Tests may also be done, including an X-ray, ultrasound, or MRI. How is this treated? This condition is initially treated with PRICE therapy. This therapy involves:  Protecting the muscle from being injured again.  Resting the injured muscle.  Icing the injured muscle.  Applying pressure (compression) to the injured muscle. This may be done with a splint or elastic bandage.  Raising (elevating) the injured muscle. Your health care provider may also recommend medicine for pain. Follow these instructions at home: If you have a splint:  Wear the splint as told by your health care provider. Remove it only as told by your health care provider.  Loosen the splint if your fingers or toes tingle, become numb, or turn cold and blue.  Keep the splint clean.  If the splint is not waterproof: ? Do not let it get wet. ? Cover it with a watertight covering when you take a bath or a shower. Managing pain, stiffness,  and swelling  If directed, put ice on the injured area: ? If you have a removable splint, remove it as told by your health care provider. ? Put ice in a plastic bag. ? Place a towel between your skin and the bag. ? Leave the ice on for 20 minutes, 2-3 times a day.  Move your fingers or toes often to avoid stiffness and to lessen swelling.  Raise (elevate) the injured area above the level of your heart while you are sitting or lying down.  Wear an elastic bandage as told by your health care provider. Make sure that it is not too tight.   General instructions  Treatment may include medicines for pain and inflammation that are taken by mouth or applied to the skin, prescription pain medicine, or muscle relaxants. Take over-the-counter and prescription medicines only as told by your health care provider.  Restrict your activity and rest the injured muscle as told by your health care provider. Gentle movements may be allowed.  If physical therapy was prescribed, do exercises as told by your health care provider.  Do not put pressure on any part of the splint until it is fully hardened. This may take several hours.  Do not use any products that contain nicotine or tobacco, such as cigarettes and e-cigarettes. These can delay bone healing. If you need help quitting, ask your health care provider.  Ask your health care provider when it is safe to drive if you have a splint.  Keep all follow-up visits as told by your health care provider. This   is important. How is this prevented?  Warm up before exercising. This helps to prevent future muscle strains. Contact a health care provider if:  You have more pain or swelling in the injured area. Get help right away if:  You have numbness or tingling or lose a lot of strength in the injured area. Summary  A muscle strain is an injury that occurs when a muscle is stretched beyond its normal length.  This condition is caused when a sudden,  violent force is placed on a muscle and stretches it too far.  This condition is initially treated with PRICE therapy, which involves protecting, resting, icing, compressing, and elevating.  Gentle movements may be allowed. If physical therapy was prescribed, do exercises as told by your health care provider. This information is not intended to replace advice given to you by your health care provider. Make sure you discuss any questions you have with your health care provider. Document Revised: 03/18/2020 Document Reviewed: 03/18/2020 Elsevier Patient Education  2021 [employer]  

## 2020-11-04 NOTE — Telephone Encounter (Signed)
Pt would like Laura Murray to call in a muscle relaxer for her shoulder so she will have it if she needs and won't have to come back. If you do 30 days please send to  [id number], [place] - [address]  If you give 90 day supply please send to [facility]

## 2020-11-05 LAB — CBC WITH DIFFERENTIAL/PLATELET
Basophils Absolute: 0 10*3/uL (ref 0.0–0.2)
Basos: 1 %
EOS (ABSOLUTE): 0.1 10*3/uL (ref 0.0–0.4)
Eos: 2 %
Hematocrit: 35.4 % (ref 34.0–46.6)
Hemoglobin: 11.2 g/dL (ref 11.1–15.9)
Immature Grans (Abs): 0 10*3/uL (ref 0.0–0.1)
Immature Granulocytes: 0 %
Lymphocytes Absolute: 1.2 10*3/uL (ref 0.7–3.1)
Lymphs: 21 %
MCH: 28.4 pg (ref 26.6–33.0)
MCHC: 31.6 g/dL (ref 31.5–35.7)
MCV: 90 fL (ref 79–97)
Monocytes Absolute: 0.4 10*3/uL (ref 0.1–0.9)
Monocytes: 8 %
Neutrophils Absolute: 4 10*3/uL (ref 1.4–7.0)
Neutrophils: 68 %
Platelets: 276 10*3/uL (ref 150–450)
RBC: 3.94 x10E6/uL (ref 3.77–5.28)
RDW: 13.4 % (ref 11.7–15.4)
WBC: 5.9 10*3/uL (ref 3.4–10.8)

## 2020-11-05 LAB — URIC ACID: Uric Acid: 3.8 mg/dL (ref 3.0–7.2)

## 2020-11-07 ENCOUNTER — Other Ambulatory Visit: Payer: Self-pay | Admitting: Family Medicine

## 2020-11-07 DIAGNOSIS — S46912A Strain of unspecified muscle, fascia and tendon at shoulder and upper arm level, left arm, initial encounter: Secondary | ICD-10-CM

## 2020-11-07 MED ORDER — METHOCARBAMOL 500 MG PO TABS: 500.0000 mg | ORAL_TABLET | Freq: Four times a day (QID) | ORAL | 0 refills | Status: DC

## 2020-11-08 ENCOUNTER — Telehealth: Payer: Self-pay

## 2020-11-08 NOTE — Telephone Encounter (Signed)
-----   Message from Tamsen Roers, PA-C sent at 11/08/2020 12:54 PM EDT ----- X-ray of foot negative for any bony abnormality of the left great toe. If no better with treatment over the this week, will need to consider a referral to a podiatrist.

## 2020-11-08 NOTE — Telephone Encounter (Signed)
LMTCB 11/08/2020

## 2020-12-23 ENCOUNTER — Inpatient Hospital Stay: Payer: BC Managed Care – PPO

## 2020-12-23 ENCOUNTER — Inpatient Hospital Stay: Payer: BC Managed Care – PPO | Admitting: Internal Medicine

## 2021-01-03 ENCOUNTER — Ambulatory Visit: Payer: BC Managed Care – PPO

## 2021-01-03 ENCOUNTER — Other Ambulatory Visit: Payer: BC Managed Care – PPO

## 2021-01-03 ENCOUNTER — Ambulatory Visit: Payer: BC Managed Care – PPO | Admitting: Internal Medicine

## 2021-01-06 ENCOUNTER — Other Ambulatory Visit: Payer: Self-pay | Admitting: Family Medicine

## 2021-01-06 DIAGNOSIS — Z7689 Persons encountering health services in other specified circumstances: Secondary | ICD-10-CM

## 2021-01-06 MED ORDER — PHENTERMINE HCL 15 MG PO CAPS: 15.0000 mg | ORAL_CAPSULE | ORAL | 1 refills | Status: DC

## 2021-01-06 NOTE — Telephone Encounter (Signed)
Medication Refill - Medication: phentermine 15 MG capsule  Via [facility] it must be 90 day supply, its cheaper for her.   Has the patient contacted their pharmacy? Yes.   (Agent: If no, request that the patient contact the pharmacy for the refill.) (Agent: If yes, when and what did the pharmacy advise?)  Preferred Pharmacy (with phone number or street name):  [facility] - [place], [place] - [address]  [address] [place] [address]  Phone: [phone] Fax: [phone]    Agent: Please be advised that RX refills may take up to 3 business days. We ask that you follow-up with your pharmacy.

## 2021-01-06 NOTE — Telephone Encounter (Signed)
   Notes to clinic:  medication filled by a historical provider  Review for refill    Requested Prescriptions  Pending Prescriptions Disp Refills   phentermine 15 MG capsule      Sig: Take 1 capsule (15 mg total) by mouth every morning.      Not Delegated - Gastroenterology:  Antiobesity Agents Failed - 01/06/2021 10:53 AM      Failed - This refill cannot be delegated      Failed - Last BP in normal range    BP Readings from Last 1 Encounters:  11/04/20 (!) 112/45          Passed - Last Heart Rate in normal range    Pulse Readings from Last 1 Encounters:  11/04/20 81          Passed - Valid encounter within last 12 months    Recent Outpatient Visits           2 months ago Muscle strain of left shoulder, initial encounter   [facility] Chrismon, Jodell Cipro, PA-C   3 months ago Status post bariatric surgery   [facility] Malva Limes, MD   7 months ago B12 deficiency   [facility] Malva Limes, MD   9 months ago Overweight   [facility] Malva Limes, MD   11 months ago B12 deficiency   [facility] Sherrie Mustache, Demetrios Isaacs, MD

## 2021-01-06 NOTE — Telephone Encounter (Signed)
Last office visit: 11/04/2020  Future appointment: none

## 2021-01-20 ENCOUNTER — Other Ambulatory Visit: Payer: Self-pay | Admitting: Family Medicine

## 2021-01-20 DIAGNOSIS — Z7689 Persons encountering health services in other specified circumstances: Secondary | ICD-10-CM

## 2021-01-20 MED ORDER — PHENTERMINE HCL 15 MG PO CAPS: 15.0000 mg | ORAL_CAPSULE | ORAL | 0 refills | Status: DC

## 2021-01-20 NOTE — Telephone Encounter (Signed)
[facility] will no longer fill pts meds due to insurance change / pt needs Rx for phentermine 15 MG capsule  To go to  [id number] [place], [place] - [address] Phone:  [phone]  Fax:  [phone]    Please resend to new pharmacy

## 2021-01-20 NOTE — Telephone Encounter (Signed)
  Notes to clinic:  [facility] will no longer fill pts meds due to insurance change   Requested Prescriptions  Pending Prescriptions Disp Refills   phentermine 15 MG capsule 90 capsule 1    Sig: Take 1 capsule (15 mg total) by mouth every morning.      Not Delegated - Gastroenterology:  Antiobesity Agents Failed - 01/20/2021  1:54 PM      Failed - This refill cannot be delegated      Failed - Last BP in normal range    BP Readings from Last 1 Encounters:  11/04/20 (!) 112/45          Passed - Last Heart Rate in normal range    Pulse Readings from Last 1 Encounters:  11/04/20 81          Passed - Valid encounter within last 12 months    Recent Outpatient Visits           2 months ago Muscle strain of left shoulder, initial encounter   [employer], Jodell Cipro, PA-C   3 months ago Status post bariatric surgery   [facility] Malva Limes, MD   7 months ago B12 deficiency   [facility] Sherrie Mustache, Demetrios Isaacs, MD   10 months ago Overweight   [facility] Malva Limes, MD   12 months ago B12 deficiency   [facility] Sherrie Mustache, Demetrios Isaacs, MD

## 2021-01-31 ENCOUNTER — Telehealth: Payer: Self-pay

## 2021-01-31 DIAGNOSIS — Z7689 Persons encountering health services in other specified circumstances: Secondary | ICD-10-CM

## 2021-01-31 NOTE — Telephone Encounter (Signed)
Patient is asking to have the [facility] and send to [facility] (she has insurance now) since its cheaper.

## 2021-02-01 MED ORDER — PHENTERMINE HCL 15 MG PO CAPS: 15.0000 mg | ORAL_CAPSULE | ORAL | 0 refills | Status: DC

## 2021-02-01 NOTE — Telephone Encounter (Signed)
Pt advised.  [facility]  Follow up appointment schedule for 03/17/2021 at 8:40am.  Thanks,   Vernona Rieger

## 2021-02-01 NOTE — Telephone Encounter (Signed)
Have sent prescription to [facility]. Please cancel [facility] that [facility]. She is also due for follow up office visit and needs to schedule sometime in the next month or two.

## 2021-02-23 ENCOUNTER — Other Ambulatory Visit: Payer: Self-pay

## 2021-02-23 ENCOUNTER — Inpatient Hospital Stay: Payer: BC Managed Care – PPO | Attending: Internal Medicine

## 2021-02-23 DIAGNOSIS — E538 Deficiency of other specified B group vitamins: Secondary | ICD-10-CM | POA: Insufficient documentation

## 2021-02-23 DIAGNOSIS — Z8249 Family history of ischemic heart disease and other diseases of the circulatory system: Secondary | ICD-10-CM | POA: Insufficient documentation

## 2021-02-23 DIAGNOSIS — Z803 Family history of malignant neoplasm of breast: Secondary | ICD-10-CM | POA: Diagnosis not present

## 2021-02-23 DIAGNOSIS — Z833 Family history of diabetes mellitus: Secondary | ICD-10-CM | POA: Insufficient documentation

## 2021-02-23 DIAGNOSIS — D500 Iron deficiency anemia secondary to blood loss (chronic): Secondary | ICD-10-CM | POA: Diagnosis not present

## 2021-02-23 DIAGNOSIS — Z8261 Family history of arthritis: Secondary | ICD-10-CM | POA: Insufficient documentation

## 2021-02-23 DIAGNOSIS — D508 Other iron deficiency anemias: Secondary | ICD-10-CM

## 2021-02-23 DIAGNOSIS — Z801 Family history of malignant neoplasm of trachea, bronchus and lung: Secondary | ICD-10-CM | POA: Diagnosis not present

## 2021-02-23 DIAGNOSIS — Z9884 Bariatric surgery status: Secondary | ICD-10-CM | POA: Insufficient documentation

## 2021-02-23 LAB — CBC WITH DIFFERENTIAL/PLATELET
Abs Immature Granulocytes: 0.01 10*3/uL (ref 0.00–0.07)
Basophils Absolute: 0 10*3/uL (ref 0.0–0.1)
Basophils Relative: 0 %
Eosinophils Absolute: 0.1 10*3/uL (ref 0.0–0.5)
Eosinophils Relative: 3 %
HCT: 34.7 % — ABNORMAL LOW (ref 36.0–46.0)
Hemoglobin: 11 g/dL — ABNORMAL LOW (ref 12.0–15.0)
Immature Granulocytes: 0 %
Lymphocytes Relative: 33 %
Lymphs Abs: 1.1 10*3/uL (ref 0.7–4.0)
MCH: 26.9 pg (ref 26.0–34.0)
MCHC: 31.7 g/dL (ref 30.0–36.0)
MCV: 84.8 fL (ref 80.0–100.0)
Monocytes Absolute: 0.2 10*3/uL (ref 0.1–1.0)
Monocytes Relative: 7 %
Neutro Abs: 1.9 10*3/uL (ref 1.7–7.7)
Neutrophils Relative %: 57 %
Platelets: 291 10*3/uL (ref 150–400)
RBC: 4.09 MIL/uL (ref 3.87–5.11)
RDW: 15.7 % — ABNORMAL HIGH (ref 11.5–15.5)
WBC: 3.3 10*3/uL — ABNORMAL LOW (ref 4.0–10.5)
nRBC: 0 % (ref 0.0–0.2)

## 2021-02-23 LAB — VITAMIN B12: Vitamin B-12: 357 pg/mL (ref 180–914)

## 2021-02-23 LAB — FERRITIN: Ferritin: 6 ng/mL — ABNORMAL LOW (ref 11–307)

## 2021-02-23 LAB — IRON AND TIBC
Iron: 38 ug/dL (ref 28–170)
Saturation Ratios: 7 % — ABNORMAL LOW (ref 10.4–31.8)
TIBC: 539 ug/dL — ABNORMAL HIGH (ref 250–450)
UIBC: 501 ug/dL

## 2021-02-24 ENCOUNTER — Inpatient Hospital Stay (HOSPITAL_BASED_OUTPATIENT_CLINIC_OR_DEPARTMENT_OTHER): Payer: BC Managed Care – PPO | Admitting: Internal Medicine

## 2021-02-24 ENCOUNTER — Inpatient Hospital Stay: Payer: BC Managed Care – PPO

## 2021-02-24 ENCOUNTER — Encounter: Payer: Self-pay | Admitting: Internal Medicine

## 2021-02-24 DIAGNOSIS — D500 Iron deficiency anemia secondary to blood loss (chronic): Secondary | ICD-10-CM

## 2021-02-24 DIAGNOSIS — D508 Other iron deficiency anemias: Secondary | ICD-10-CM | POA: Diagnosis not present

## 2021-02-24 DIAGNOSIS — E538 Deficiency of other specified B group vitamins: Secondary | ICD-10-CM | POA: Diagnosis not present

## 2021-02-24 MED ORDER — IRON SUCROSE 20 MG/ML IV SOLN
200.0000 mg | Freq: Once | INTRAVENOUS | Status: DC
  Filled 2021-02-24: qty 10

## 2021-02-24 MED ORDER — SODIUM CHLORIDE 0.9 % IV SOLN: 200.0000 mg | Freq: Once | INTRAVENOUS | Status: DC

## 2021-02-24 MED ORDER — CYANOCOBALAMIN 1000 MCG/ML IJ SOLN
1000.0000 ug | Freq: Once | INTRAMUSCULAR | Status: AC
  Administered 2021-02-24: 1000 ug via INTRAMUSCULAR
  Filled 2021-02-24: qty 1

## 2021-02-24 MED ORDER — SODIUM CHLORIDE 0.9 % IV SOLN
Freq: Once | INTRAVENOUS | Status: AC
  Filled 2021-02-24: qty 250

## 2021-02-24 NOTE — Assessment & Plan Note (Addendum)
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  symptomatic.   IV Venofer today [Fin].  #B12 deficiency-Dec 2021- 350+ B12 injection q 82M.   # DISPOSITION: # B12 injec /Iv venofer today # Follow up in 6 months-MD; few days prior- labs- cbc; b12 levels/ferritin/iron studies; possible venofer/b12 injection-Dr.B  ADDENDUM: Patient concerned about "reaction" to venofer; interested in ferrahem. Will await insurance authorization.   Cc;Dr.[name].

## 2021-02-24 NOTE — Progress Notes (Signed)
Cancer Center OFFICE PROGRESS NOTE  Patient Care Team: Malva Limes, MD as PCP - General (Family Medicine) Marin Roberts, MD as Referring Physician (Internal Medicine) Lemar Livings Merrily Pew, MD as Consulting Physician (General Surgery) Schermerhorn, Ihor Austin, MD as Referring Physician (Obstetrics and Gynecology)   SUMMARY OF ONCOLOGIC HISTORY:  # IDA [place] Breeding to gastric Bypass 2002/ menstrual blood loss]; IV iron q 6 M/ B12 IM [thru PCP]; B12 def-   # 2019- ? raynauds' s/p rheuma eval.   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   Patient denies any blood in stools or black or stools.  Admits to mild to moderate fatigue.   Review of Systems  Constitutional:  Positive for malaise/fatigue. Negative for chills, diaphoresis, fever and weight loss.  HENT:  Negative for nosebleeds and sore throat.   Eyes:  Negative for double vision.  Respiratory:  Negative for cough, hemoptysis, sputum production, shortness of breath and wheezing.   Cardiovascular:  Negative for chest pain, palpitations, orthopnea and leg swelling.  Gastrointestinal:  Negative for abdominal pain, blood in stool, constipation, diarrhea, heartburn, melena, nausea and vomiting.  Genitourinary:  Negative for dysuria, frequency and urgency.  Musculoskeletal:  Negative for back pain and joint pain.  Skin: Negative.  Negative for itching and rash.  Neurological:  Negative for dizziness, tingling, focal weakness, weakness and headaches.  Endo/Heme/Allergies:  Does not bruise/bleed easily.  Psychiatric/Behavioral:  Negative for depression. The patient has insomnia. The patient is not nervous/anxious.   Marland Kitchen   PAST MEDICAL HISTORY :  Past Medical History:  Diagnosis Date   Anemia    Insomnia    Obesity    Ovarian cyst    Upper GI bleeding 2015   gastric ulcer    PAST SURGICAL  HISTORY :   Past Surgical History:  Procedure Laterality Date   BREAST CYST ASPIRATION Left 02/02/2014   Dr Lemar Livings   BREAST CYST ASPIRATION Right 02/2018   CHOLECYSTECTOMY  2009   COLONOSCOPY  2014   GASTRIC BYPASS  2006, 2015   2015 revision   GASTRIC RESECTION  2015   SHOULDER ARTHROSCOPY WITH OPEN ROTATOR CUFF REPAIR Right 03/27/2018   Procedure: SHOULDER ARTHROSCOPY WITH OPEN ROTATOR CUFF REPAIR;  Surgeon: Christena Flake, MD;  Location: [facility];  Service: Orthopedics;  Laterality: Right;   SHOULDER CLOSED REDUCTION Right 07/08/2018   Procedure: CLOSED MANIPULATION SHOULDER;  Surgeon: Christena Flake, MD;  Location: [facility];  Service: Orthopedics;  Laterality: Right;   STERIOD INJECTION Right 07/08/2018   Procedure: STEROID INJECTION OF RIGHT SHOULDER;  Surgeon: Christena Flake, MD;  Location: [facility];  Service: Orthopedics;  Laterality: Right;   TONSILLECTOMY     TUBAL LIGATION     UPPER GI ENDOSCOPY      FAMILY HISTORY :   Family History  Problem Relation Age of Onset   Lung cancer Maternal Grandfather    Cancer Maternal Grandfather    Diabetes Mother    Hypertension Mother    Diabetes Father    Hypertension Father    Crohn's disease Son    Rheum arthritis Son    Asthma Son    Cancer Maternal Grandmother    Breast cancer Maternal Aunt        [phone]   Colon cancer Maternal Uncle     SOCIAL HISTORY:   Social History   Tobacco Use  Smoking status: Never   Smokeless tobacco: Never  Substance Use Topics   Alcohol use: No   Drug use: No    ALLERGIES:  is allergic to nsaids.  MEDICATIONS:  Current Outpatient Medications  Medication Sig Dispense Refill   acetaminophen (TYLENOL) 500 MG tablet Take 1,000 mg by mouth every 6 (six) hours as needed for moderate pain or headache.      amitriptyline (ELAVIL) 50 MG tablet Take 50 mg by mouth at bedtime as needed for sleep.     Calcium-Magnesium-Vitamin D (CALCIUM 500 PO) Take 500 mg by mouth 2 (two) times daily.      CYANOCOBALAMIN IJ Inject as directed.     Magnesium 400 MG TABS Take 400 mg by mouth daily.     methocarbamol (ROBAXIN) 500 MG tablet Take 1 tablet (500 mg total) by mouth 4 (four) times daily. 40 tablet 0   phentermine 15 MG capsule Take 1 capsule (15 mg total) by mouth every morning. Please schedule an office visit before anymore refills. 90 capsule 0   Probiotic CAPS Take 1 capsule by mouth daily.     topiramate (TOPAMAX) 50 MG tablet Take 50 mg by mouth daily.     Vitamin D, Ergocalciferol, (DRISDOL) 1.25 MG (50000 UNIT) CAPS capsule TAKE 1 CAPSULE ONCE A WEEK 12 capsule 4   No current facility-administered medications for this visit.    PHYSICAL EXAMINATION: ECOG PERFORMANCE STATUS: 0 - Asymptomatic  BP 128/72   Pulse 66   Temp 97.7 F (36.5 C) (Tympanic)   Resp 20   Ht 5\' 4"  (1.626 m)   Wt 144 lb 9.6 oz (65.6 kg)   BMI 24.82 kg/m   Filed Weights   02/24/21 1427  Weight: 144 lb 9.6 oz (65.6 kg)    Physical Exam HENT:     Head: Normocephalic and atraumatic.     Mouth/Throat:     Pharynx: No oropharyngeal exudate.  Eyes:     Pupils: Pupils are equal, round, and reactive to light.  Cardiovascular:     Rate and Rhythm: Normal rate and regular rhythm.  Pulmonary:     Effort: No respiratory distress.     Breath sounds: No wheezing.  Abdominal:     General: Bowel sounds are normal. There is no distension.     Palpations: Abdomen is soft. There is no mass.     Tenderness: no abdominal tenderness There is no guarding or rebound.  Musculoskeletal:        General: No tenderness. Normal range of motion.     Cervical back: Normal range of motion and neck supple.  Skin:    General: Skin is warm.  Neurological:     Mental Status: She is alert and oriented to person, place, and time.  Psychiatric:        Mood and Affect: Affect normal.    LABORATORY DATA:  I have reviewed the data as listed    Component Value Date/Time   NA 139 11/20/2019 0859   NA 142 03/23/2013  0428   K 4.3 11/20/2019 0859   K 4.1 06/29/2013 0902   CL 104 11/20/2019 0859   CL 112 (H) 03/23/2013 0428   CO2 22 11/20/2019 0859   CO2 24 03/23/2013 0428   GLUCOSE 92 11/20/2019 0859   GLUCOSE 104 (H) 06/20/2018 1509   GLUCOSE 90 03/23/2013 0428   BUN 13 11/20/2019 0859   BUN 5 (L) 03/23/2013 0428   CREATININE 0.88 11/20/2019 0859   CREATININE 0.75 06/12/2013  7628   CALCIUM 8.5 (L) 11/20/2019 0859   CALCIUM 7.7 (L) 03/23/2013 0428   PROT 6.8 11/20/2019 0859   PROT 6.1 (L) 03/20/2013 1843   ALBUMIN 3.9 11/20/2019 0859   ALBUMIN 3.0 (L) 03/20/2013 1843   AST 20 11/20/2019 0859   AST 25 03/20/2013 1843   ALT 17 11/20/2019 0859   ALT 49 03/20/2013 1843   ALKPHOS 91 11/20/2019 0859   ALKPHOS 84 03/20/2013 1843   BILITOT 0.2 11/20/2019 0859   BILITOT 0.1 (L) 03/20/2013 1843   GFRNONAA 76 11/20/2019 0859   GFRNONAA >60 06/12/2013 0842   GFRAA 87 11/20/2019 0859   GFRAA >60 06/12/2013 0842    No results found for: SPEP, UPEP  Lab Results  Component Value Date   WBC 3.3 (L) 02/23/2021   NEUTROABS 1.9 02/23/2021   HGB 11.0 (L) 02/23/2021   HCT 34.7 (L) 02/23/2021   MCV 84.8 02/23/2021   PLT 291 02/23/2021      Chemistry      Component Value Date/Time   NA 139 11/20/2019 0859   NA 142 03/23/2013 0428   K 4.3 11/20/2019 0859   K 4.1 06/29/2013 0902   CL 104 11/20/2019 0859   CL 112 (H) 03/23/2013 0428   CO2 22 11/20/2019 0859   CO2 24 03/23/2013 0428   BUN 13 11/20/2019 0859   BUN 5 (L) 03/23/2013 0428   CREATININE 0.88 11/20/2019 0859   CREATININE 0.75 06/12/2013 0842   GLU 82 10/16/2013 0000      Component Value Date/Time   CALCIUM 8.5 (L) 11/20/2019 0859   CALCIUM 7.7 (L) 03/23/2013 0428   ALKPHOS 91 11/20/2019 0859   ALKPHOS 84 03/20/2013 1843   AST 20 11/20/2019 0859   AST 25 03/20/2013 1843   ALT 17 11/20/2019 0859   ALT 49 03/20/2013 1843   BILITOT 0.2 11/20/2019 0859   BILITOT 0.1 (L) 03/20/2013 1843        ASSESSMENT & PLAN:  Acquired  iron deficiency anemia due to decreased absorption # 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  symptomatic.   IV Venofer today [Fin].  #B12 deficiency-Dec 2021- 350+ B12 injection q 24M.   # DISPOSITION: # B12 injec /Iv venofer today # Follow up in 6 months-MD; few days prior- labs- cbc; b12 levels/ferritin/iron studies; possible venofer/b12 injection-Dr.B  ADDENDUM: Patient concerned about "reaction" to venofer; interested in ferrahem. Will await insurance authorization.   Cc;Dr.[name].      Earna Coder, MD 03/01/2021 4:17 PM

## 2021-02-24 NOTE — Progress Notes (Signed)
Patient scheduled to receive Venofer infusion today. Upon arrival to infusion suite, patient was made aware of what she was receiving for the day. Patient then states that she is supposed to receive Feraheme and has always received Feraheme in the past. She reports that she received "another type" of iron that made her sick around 2015 or 2017 and has since had Feraheme infusions with no concerns. Patient became extremely anxious and Dr. Donneta Romberg made aware. Per Dr. Donneta Romberg, no Venofer today and discharge patient. Patient is to instead receive authorization for Feraheme infusion and will be rescheduled.

## 2021-02-28 ENCOUNTER — Encounter: Payer: Self-pay | Admitting: Internal Medicine

## 2021-02-28 ENCOUNTER — Telehealth: Payer: Self-pay | Admitting: Internal Medicine

## 2021-02-28 NOTE — Telephone Encounter (Signed)
Patient would like to see if there is an update on the status of insurance approval for feraheme. She can be reached at [phone]

## 2021-02-28 NOTE — Telephone Encounter (Signed)
I spoke with patient. I made her aware that Dr. B has to do a peer to peer for her fereheme in order to get this approved by her insurance.  Patient works Monday- Thursday from 8-5  and would like to be reached back at her job [phone]). We can call her cell # on Friday's at [phone]. She thanked me for updating her.

## 2021-03-01 ENCOUNTER — Encounter: Payer: Self-pay | Admitting: Internal Medicine

## 2021-03-02 NOTE — Telephone Encounter (Signed)
Pt contacted and informed that her insurance approved the fereheme. Apt provided for patient for 03/10/21

## 2021-03-10 ENCOUNTER — Inpatient Hospital Stay: Payer: BC Managed Care – PPO | Attending: Internal Medicine

## 2021-03-10 VITALS — BP 139/54 | HR 75 | Temp 97.0°F | Resp 19

## 2021-03-10 DIAGNOSIS — Z801 Family history of malignant neoplasm of trachea, bronchus and lung: Secondary | ICD-10-CM | POA: Diagnosis not present

## 2021-03-10 DIAGNOSIS — Z8249 Family history of ischemic heart disease and other diseases of the circulatory system: Secondary | ICD-10-CM | POA: Diagnosis not present

## 2021-03-10 DIAGNOSIS — Z803 Family history of malignant neoplasm of breast: Secondary | ICD-10-CM | POA: Insufficient documentation

## 2021-03-10 DIAGNOSIS — D500 Iron deficiency anemia secondary to blood loss (chronic): Secondary | ICD-10-CM | POA: Insufficient documentation

## 2021-03-10 DIAGNOSIS — Z833 Family history of diabetes mellitus: Secondary | ICD-10-CM | POA: Diagnosis not present

## 2021-03-10 DIAGNOSIS — Z8261 Family history of arthritis: Secondary | ICD-10-CM | POA: Diagnosis not present

## 2021-03-10 DIAGNOSIS — E538 Deficiency of other specified B group vitamins: Secondary | ICD-10-CM | POA: Insufficient documentation

## 2021-03-10 DIAGNOSIS — Z9884 Bariatric surgery status: Secondary | ICD-10-CM | POA: Diagnosis not present

## 2021-03-10 MED ORDER — SODIUM CHLORIDE 0.9 % IV SOLN
510.0000 mg | Freq: Once | INTRAVENOUS | Status: AC
  Administered 2021-03-10: 510 mg via INTRAVENOUS
  Filled 2021-03-10: qty 510

## 2021-03-10 MED ORDER — SODIUM CHLORIDE 0.9 % IV SOLN
Freq: Once | INTRAVENOUS | Status: AC
  Filled 2021-03-10: qty 250

## 2021-03-10 NOTE — Patient Instructions (Signed)
CANCER CENTER [place] [facility]   Discharge Instructions: Thank you for choosing [facility] to provide your oncology and hematology care.  If you have a lab appointment with the Cancer Center, please go directly to the Cancer Center and check in at the registration area.  Wear comfortable clothing and clothing appropriate for easy access to any Portacath or PICC line.   We strive to give you quality time with your provider. You may need to reschedule your appointment if you arrive late (15 or more minutes).  Arriving late affects you and other patients whose appointments are after yours.  Also, if you miss three or more appointments without notifying the office, you may be dismissed from the clinic at the provider's discretion.      For prescription refill requests, have your pharmacy contact our office and allow 72 hours for refills to be completed.    Today you received Feraheme   To help prevent nausea and vomiting after your treatment, we encourage you to take your nausea medication as directed.  BELOW ARE SYMPTOMS THAT SHOULD BE REPORTED IMMEDIATELY: *FEVER GREATER THAN 100.4 F (38 C) OR HIGHER *CHILLS OR SWEATING *NAUSEA AND VOMITING THAT IS NOT CONTROLLED WITH YOUR NAUSEA MEDICATION *UNUSUAL SHORTNESS OF BREATH *UNUSUAL BRUISING OR BLEEDING *URINARY PROBLEMS (pain or burning when urinating, or frequent urination) *BOWEL PROBLEMS (unusual diarrhea, constipation, pain near the anus) TENDERNESS IN MOUTH AND THROAT WITH OR WITHOUT PRESENCE OF ULCERS (sore throat, sores in mouth, or a toothache) UNUSUAL RASH, SWELLING OR PAIN  UNUSUAL VAGINAL DISCHARGE OR ITCHING   Items with * indicate a potential emergency and should be followed up as soon as possible or go to the Emergency Department if any problems should occur.  Please show the CHEMOTHERAPY ALERT CARD or IMMUNOTHERAPY ALERT CARD at check-in to the Emergency Department and triage nurse.  Should  you have questions after your visit or need to cancel or reschedule your appointment, please contact [facility] [place] [facility] ONCOLOGY  [phone] and follow the prompts.  Office hours are 8:00 a.m. to 4:30 p.m. Monday - Friday. Please note that voicemails left after 4:00 p.m. may not be returned until the following business day.  We are closed weekends and major holidays. You have access to a nurse at all times for urgent questions. Please call the main number to the clinic [phone] and follow the prompts.  For any non-urgent questions, you may also contact your provider using MyChart. We now offer e-Visits for anyone 18 and older to request care online for non-urgent symptoms. For details visit [email or web address].   Also download the MyChart app! Go to the app store, search "MyChart", open the app, select [facility], and log in with your MyChart username and password.  Due to Covid, a mask is required upon entering the hospital/clinic. If you do not have a mask, one will be given to you upon arrival. For doctor visits, patients may have 1 support person aged 18 or older with them. For treatment visits, patients cannot have anyone with them due to current Covid guidelines and our immunocompromised population.  

## 2021-03-10 NOTE — Progress Notes (Signed)
Pt tolerate Feraheme infusion well with no signs of complications. Pt observed 30 minutes post feraheme infusion per protocol, no issues noted. RN educated pt on the importance of notifying the clinic if any complications occur at home, pt verbalized understanding. VSS. Pt stable for discharge.   Brighid Koch [employer]

## 2021-03-17 ENCOUNTER — Ambulatory Visit: Payer: BC Managed Care – PPO | Admitting: Family Medicine

## 2021-03-17 ENCOUNTER — Other Ambulatory Visit: Payer: Self-pay

## 2021-03-17 ENCOUNTER — Encounter: Payer: Self-pay | Admitting: Family Medicine

## 2021-03-17 DIAGNOSIS — Z7689 Persons encountering health services in other specified circumstances: Secondary | ICD-10-CM | POA: Diagnosis not present

## 2021-03-17 MED ORDER — TOPIRAMATE 50 MG PO TABS: 50.0000 mg | ORAL_TABLET | Freq: Every day | ORAL | 1 refills | Status: DC

## 2021-03-17 NOTE — Progress Notes (Signed)
Established patient visit   Patient: Laura Murray   DOB: Dec 31, 1965   55 y.o. Female  MRN: [id number] Visit Date: 03/17/2021  Today's healthcare provider: Mila Merry, MD   Chief Complaint  Patient presents with   Weight Check    Subjective    HPI  Follow up for overweight  The patient was last seen for this 5 months ago. Changes made at last visit include no changes.  She reports excellent compliance with treatment. She feels that condition is Unchanged. She is not having side effects.   -----------------------------------------------------------------------------------------     Medications: Outpatient Medications Prior to Visit  Medication Sig   acetaminophen (TYLENOL) 500 MG tablet Take 1,000 mg by mouth every 6 (six) hours as needed for moderate pain or headache.    amitriptyline (ELAVIL) 50 MG tablet Take 50 mg by mouth at bedtime as needed for sleep.   Calcium-Magnesium-Vitamin D (CALCIUM 500 PO) Take 500 mg by mouth 2 (two) times daily.   CYANOCOBALAMIN IJ Inject as directed.   Magnesium 400 MG TABS Take 400 mg by mouth daily.   methocarbamol (ROBAXIN) 500 MG tablet Take 1 tablet (500 mg total) by mouth 4 (four) times daily.   phentermine 15 MG capsule Take 1 capsule (15 mg total) by mouth every morning. Please schedule an office visit before anymore refills.   Probiotic CAPS Take 1 capsule by mouth daily.   topiramate (TOPAMAX) 50 MG tablet Take 50 mg by mouth daily.   Vitamin D, Ergocalciferol, (DRISDOL) 1.25 MG (50000 UNIT) CAPS capsule TAKE 1 CAPSULE ONCE A WEEK   No facility-administered medications prior to visit.    Review of Systems  Constitutional: Negative.   Respiratory: Negative.    Cardiovascular: Negative.   Gastrointestinal: Negative.   Endocrine: Negative.   Neurological:  Negative for dizziness, light-headedness and headaches.      Objective    BP 129/72 (BP Location: Left Arm, Patient Position: Sitting, Cuff Size:  Normal)   Pulse 67   Temp 98.6 F (37 C) (Oral)   Wt 149 lb (67.6 kg)   BMI 25.58 kg/m     Physical Exam   General: Appearance:     Well developed, well nourished female in no acute distress  Eyes:    PERRL, conjunctiva/corneas clear, EOM's intact       Lungs:     Clear to auscultation bilaterally, respirations unlabored  Heart:    Normal heart rate. Normal rhythm. No murmurs, rubs, or gallops.    MS:   All extremities are intact.    Neurologic:   Awake, alert, oriented x 3. No apparent focal neurological defect.          Assessment & Plan     1. Encounter for weight management Doing well with QD phentermine and 1 daily topiramate. Did not tolerate BID topiramate. Has started regular exercise routine. Refill topiramate (TOPAMAX) 50 MG tablet; Take 1 tablet (50 mg total) by mouth daily.  Dispense: 90 tablet; Refill: 1   Phentermine refill due through mail order in October.   She declined flu vaccine  Future Appointments  Date Time Provider Department Center  08/25/2021  1:00 PM [facility] None  08/25/2021  1:30 PM Earna Coder, MD [facility] None  08/25/2021  2:00 PM [facility] INFUSION CHAIR 3 [facility] None         The entirety of the information documented in the History of Present Illness, Review of Systems and Physical Exam  were personally obtained by me. Portions of this information were initially documented by the CMA and reviewed by me for thoroughness and accuracy.     Mila Merry, MD  [facility] [phone] (phone) [phone] (fax)  [facility]

## 2021-03-17 NOTE — Patient Instructions (Signed)
Please review the attached list of medications and notify my office if there are any errors.   I recommend you take OTC vitamin B12 1,000mg  once a day

## 2021-03-30 IMAGING — CT CT HEAD WO/W CM
4 of 7 series · 14 of 47 positions shown, 16 images · IV contrast (omnipaque)
Comparison: None.

CLINICAL DATA: Headaches.  Changes in vision.

EXAM:
CT HEAD WITHOUT AND WITH CONTRAST
TECHNIQUE: Contiguous axial images were obtained from the base of the skull
through the vertex without and with intravenous contrast
CONTRAST:  75mL OMNIPAQUE IOHEXOL 300 MG/ML  SOLN

[Series 2: axial st brain 5.00 ax · axial · 0.32mm/px · z∈[-514,-431]mm · 6 of 25 slices shown, 8 images (1 of 2)]
[im 4/25  brain]
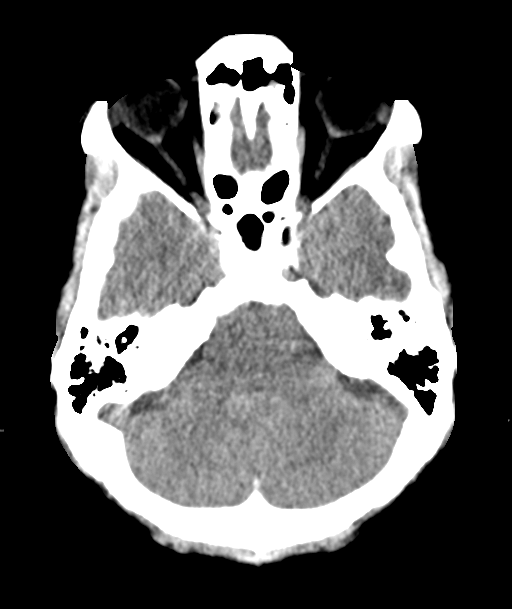
[im 4/25  bone]
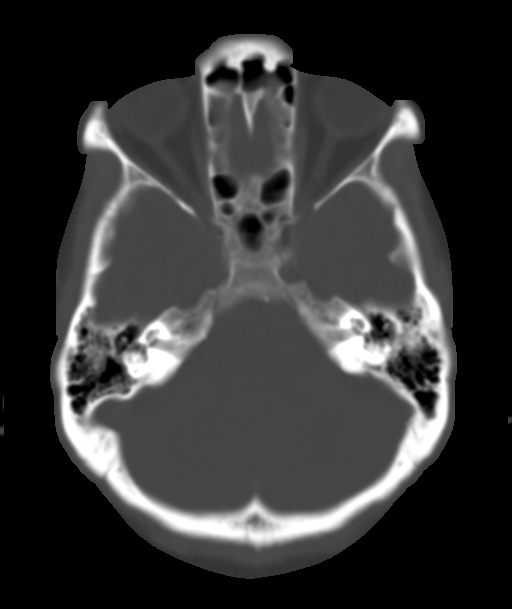
[im 7/25  brain]
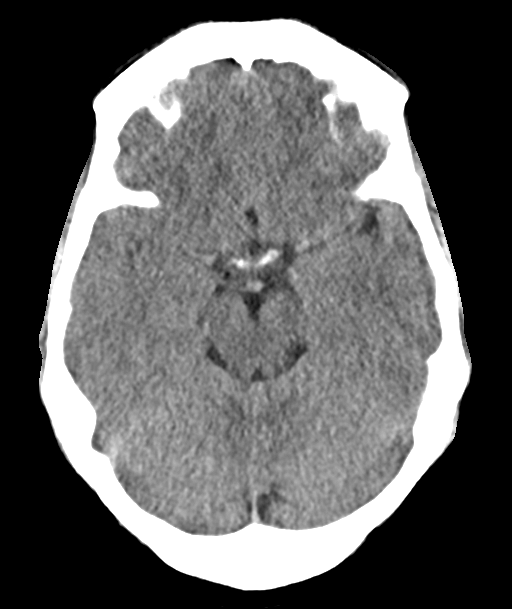
[im 11/25  brain]
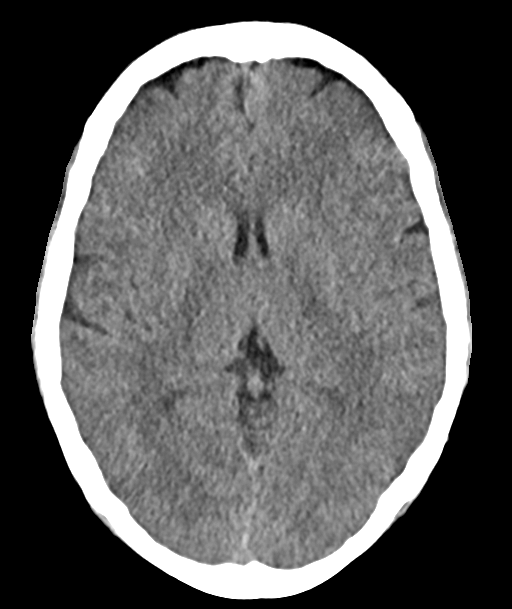
[im 14/25  brain]
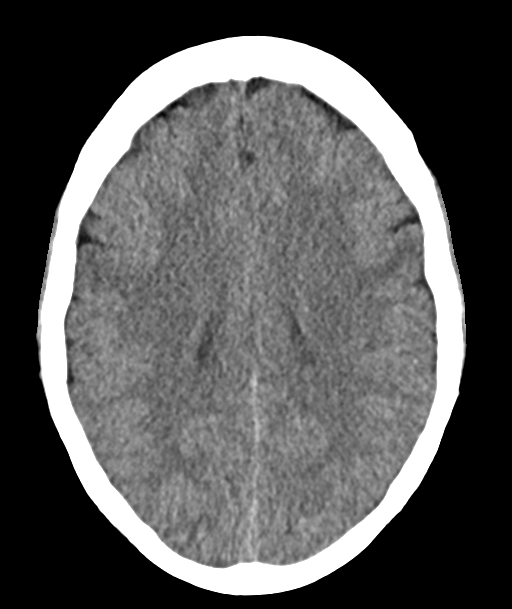
[im 18/25  brain]
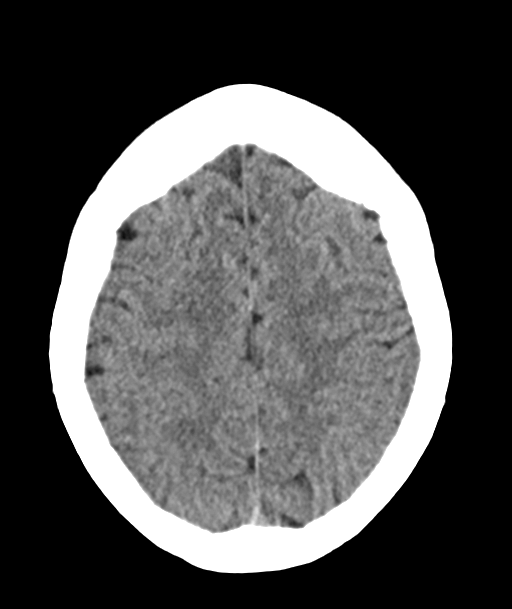
[im 18/25  bone]
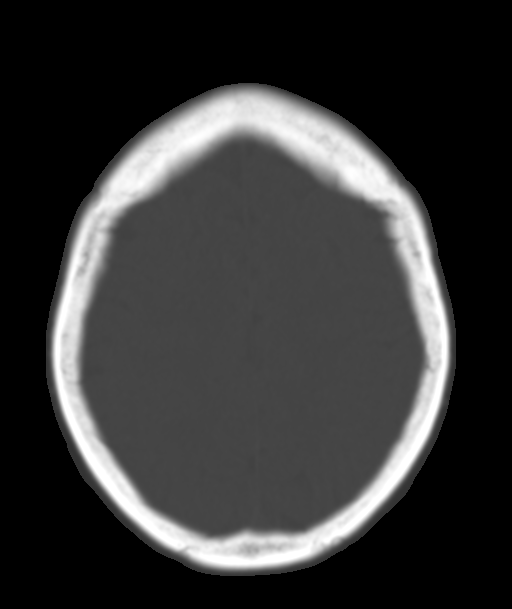
[im 21/25  brain]
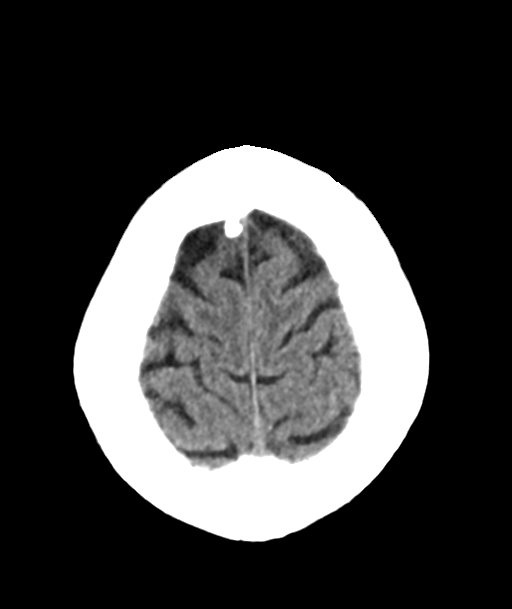

[Series 6: axial st brain 5.00 ax · axial · 0.31mm/px · z∈[-514,-494]mm · 2 of 27 slices shown (2 of 2)]
[im 5/27  brain]
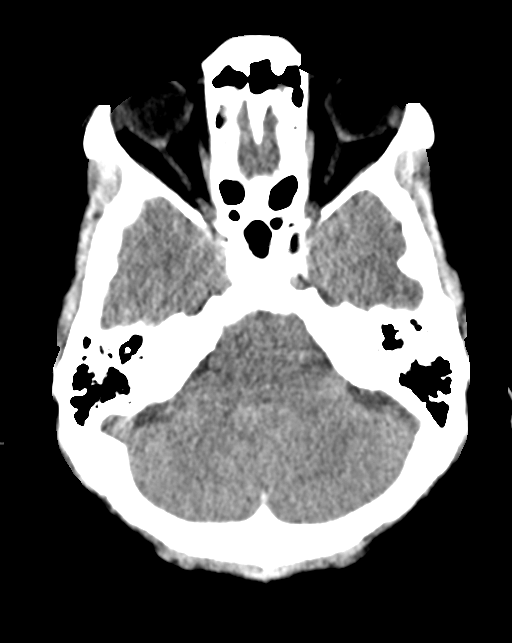
[im 9/27  brain]
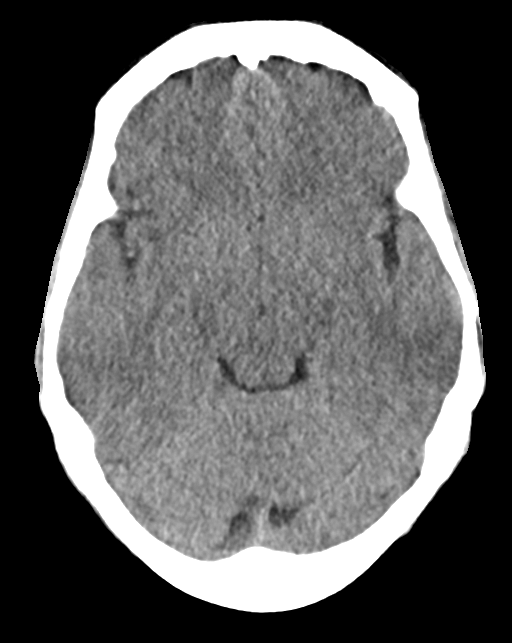

[Series 12: coronals brain 2.00 cor · coronal · 0.27mm/px · 3 of 100 slices shown]
[im 34/100  brain]
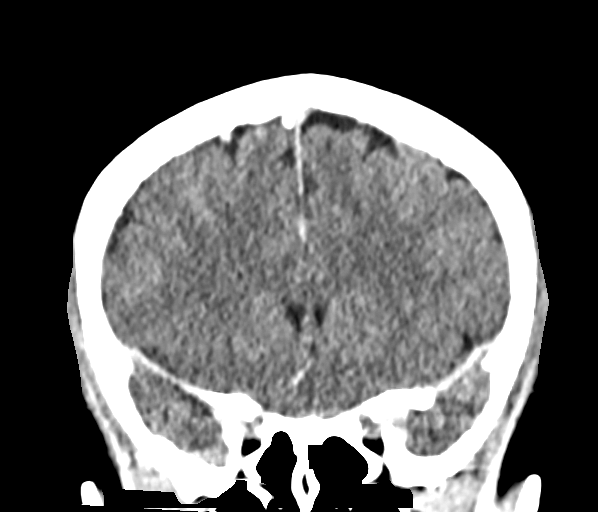
[im 45/100  brain]
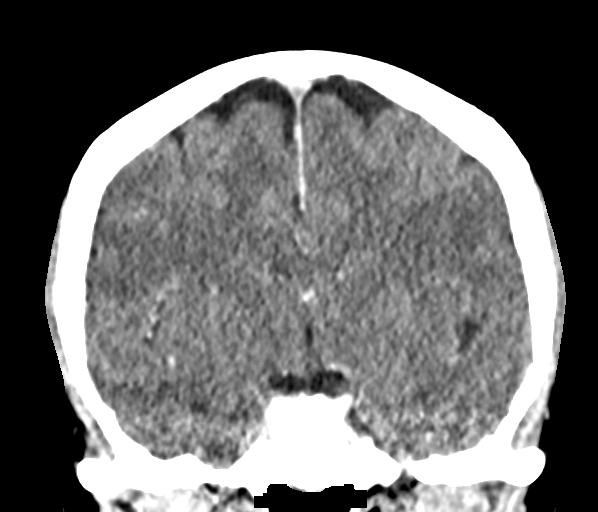
[im 56/100  brain]
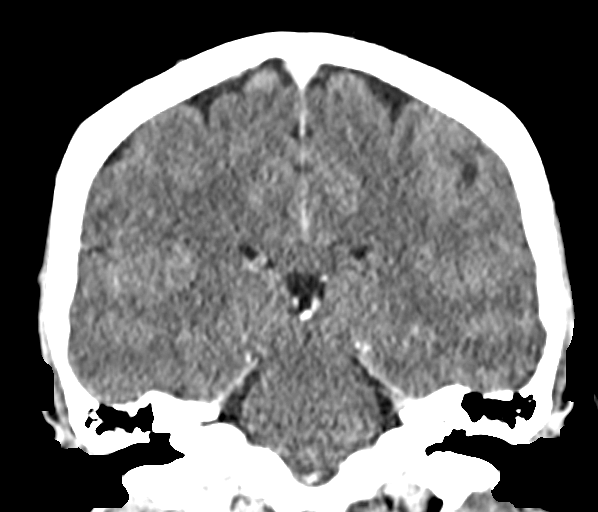

[Series 14: sagittals brain 2.00 sag · sagittal · 0.27mm/px · 3 of 79 slices shown]
[im 27/79  brain]
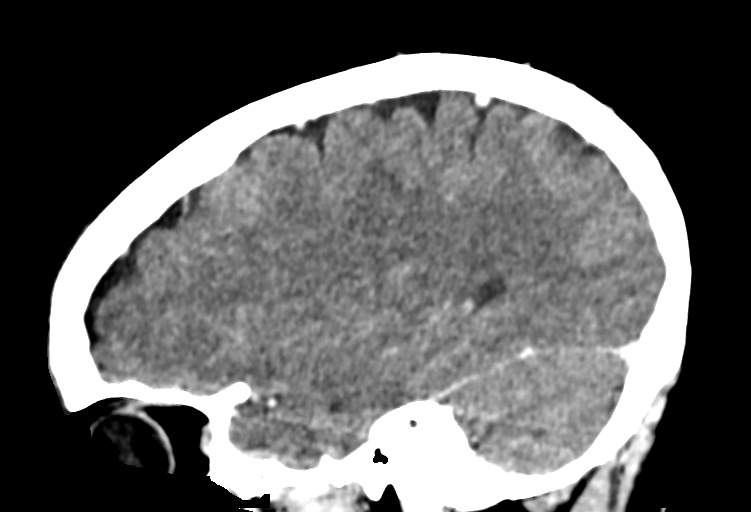
[im 40/79  brain]
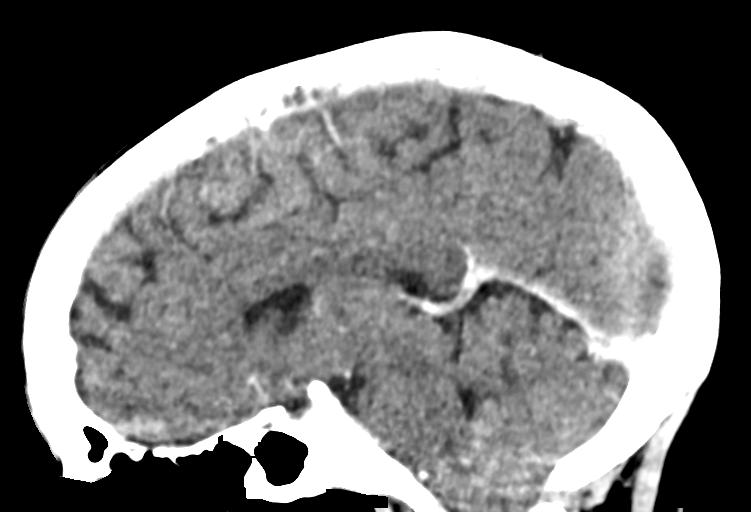
[im 53/79  brain]
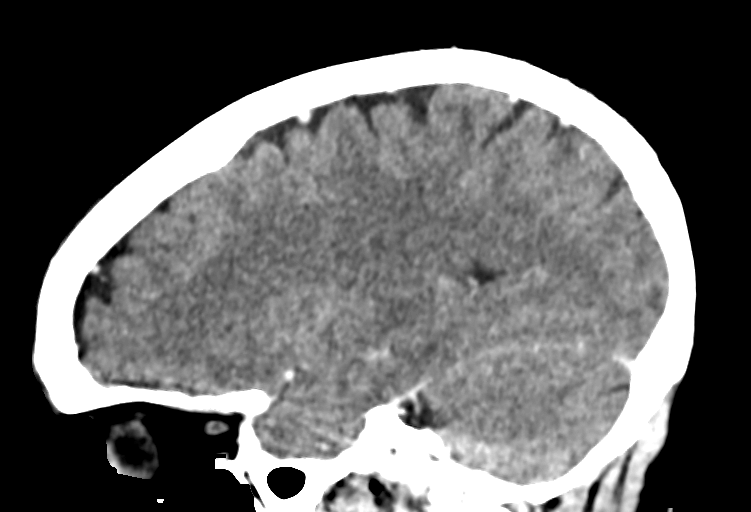

[14 of 47 positions shown; findings below may reference images not displayed]

FINDINGS: Brain: No evidence of acute large vascular territory infarction,
acute hemorrhage, hydrocephalus, extra-axial collection or mass
lesion/mass effect. Mild cerebellar tonsillar ectopia, which does
not meet criteria for Chiari malformation. Streak artifact through
the frontal lobes. No abnormal enhancement.

Vascular: No hyperdense vessel or unexpected calcification.

Skull: Normal. Negative for fracture or focal lesion.

Sinuses/Orbits: No acute finding.

Other: No mastoid effusions.
IMPRESSION: No evidence of acute intracranial abnormality.

## 2021-04-12 ENCOUNTER — Other Ambulatory Visit: Payer: Self-pay | Admitting: Family Medicine

## 2021-04-12 DIAGNOSIS — Z7689 Persons encountering health services in other specified circumstances: Secondary | ICD-10-CM

## 2021-04-12 MED ORDER — PHENTERMINE HCL 15 MG PO CAPS: 15.0000 mg | ORAL_CAPSULE | ORAL | 1 refills | Status: DC

## 2021-05-30 ENCOUNTER — Other Ambulatory Visit: Payer: Self-pay | Admitting: Obstetrics and Gynecology

## 2021-05-30 DIAGNOSIS — Z1231 Encounter for screening mammogram for malignant neoplasm of breast: Secondary | ICD-10-CM

## 2021-06-21 ENCOUNTER — Encounter: Payer: Self-pay | Admitting: Family Medicine

## 2021-06-21 DIAGNOSIS — E559 Vitamin D deficiency, unspecified: Secondary | ICD-10-CM

## 2021-06-21 DIAGNOSIS — Z7689 Persons encountering health services in other specified circumstances: Secondary | ICD-10-CM

## 2021-06-21 NOTE — Telephone Encounter (Signed)
LOV: 03/17/2021  NOV: 09/15/2021   Thanks,   -Vernona Rieger

## 2021-06-22 MED ORDER — PHENTERMINE HCL 15 MG PO CAPS: 15.0000 mg | ORAL_CAPSULE | ORAL | 1 refills | Status: DC

## 2021-06-22 MED ORDER — VITAMIN D (ERGOCALCIFEROL) 1.25 MG (50000 UNIT) PO CAPS: ORAL_CAPSULE | ORAL | 4 refills | Status: DC

## 2021-06-30 ENCOUNTER — Other Ambulatory Visit: Payer: Self-pay

## 2021-06-30 ENCOUNTER — Ambulatory Visit
Admission: RE | Admit: 2021-06-30 | Discharge: 2021-06-30 | Disposition: A | Discharge status: Home / Self Care | Payer: BC Managed Care – PPO | Source: Ambulatory Visit | Attending: Obstetrics and Gynecology | Admitting: Obstetrics and Gynecology

## 2021-06-30 DIAGNOSIS — Z1231 Encounter for screening mammogram for malignant neoplasm of breast: Secondary | ICD-10-CM | POA: Diagnosis not present

## 2021-08-14 ENCOUNTER — Other Ambulatory Visit: Payer: Self-pay | Admitting: *Deleted

## 2021-08-14 DIAGNOSIS — D500 Iron deficiency anemia secondary to blood loss (chronic): Secondary | ICD-10-CM

## 2021-08-18 ENCOUNTER — Other Ambulatory Visit: Payer: Self-pay | Admitting: Internal Medicine

## 2021-08-18 ENCOUNTER — Other Ambulatory Visit: Payer: Self-pay

## 2021-08-18 ENCOUNTER — Telehealth: Payer: Self-pay

## 2021-08-18 ENCOUNTER — Inpatient Hospital Stay: Payer: BC Managed Care – PPO | Attending: Internal Medicine

## 2021-08-18 DIAGNOSIS — Z8249 Family history of ischemic heart disease and other diseases of the circulatory system: Secondary | ICD-10-CM | POA: Diagnosis not present

## 2021-08-18 DIAGNOSIS — Z833 Family history of diabetes mellitus: Secondary | ICD-10-CM | POA: Insufficient documentation

## 2021-08-18 DIAGNOSIS — Z801 Family history of malignant neoplasm of trachea, bronchus and lung: Secondary | ICD-10-CM | POA: Diagnosis not present

## 2021-08-18 DIAGNOSIS — Z9884 Bariatric surgery status: Secondary | ICD-10-CM | POA: Diagnosis not present

## 2021-08-18 DIAGNOSIS — D500 Iron deficiency anemia secondary to blood loss (chronic): Secondary | ICD-10-CM

## 2021-08-18 DIAGNOSIS — Z8261 Family history of arthritis: Secondary | ICD-10-CM | POA: Diagnosis not present

## 2021-08-18 DIAGNOSIS — E538 Deficiency of other specified B group vitamins: Secondary | ICD-10-CM | POA: Insufficient documentation

## 2021-08-18 DIAGNOSIS — Z803 Family history of malignant neoplasm of breast: Secondary | ICD-10-CM | POA: Diagnosis not present

## 2021-08-18 LAB — CBC WITH DIFFERENTIAL/PLATELET
Abs Immature Granulocytes: 0.01 10*3/uL (ref 0.00–0.07)
Basophils Absolute: 0 10*3/uL (ref 0.0–0.1)
Basophils Relative: 0 %
Eosinophils Absolute: 0.1 10*3/uL (ref 0.0–0.5)
Eosinophils Relative: 2 %
HCT: 34.4 % — ABNORMAL LOW (ref 36.0–46.0)
Hemoglobin: 11.3 g/dL — ABNORMAL LOW (ref 12.0–15.0)
Immature Granulocytes: 0 %
Lymphocytes Relative: 38 %
Lymphs Abs: 1.6 10*3/uL (ref 0.7–4.0)
MCH: 30.8 pg (ref 26.0–34.0)
MCHC: 32.8 g/dL (ref 30.0–36.0)
MCV: 93.7 fL (ref 80.0–100.0)
Monocytes Absolute: 0.4 10*3/uL (ref 0.1–1.0)
Monocytes Relative: 9 %
Neutro Abs: 2.1 10*3/uL (ref 1.7–7.7)
Neutrophils Relative %: 51 %
Platelets: 243 10*3/uL (ref 150–400)
RBC: 3.67 MIL/uL — ABNORMAL LOW (ref 3.87–5.11)
RDW: 13 % (ref 11.5–15.5)
WBC: 4.2 10*3/uL (ref 4.0–10.5)
nRBC: 0 % (ref 0.0–0.2)

## 2021-08-18 LAB — FERRITIN: Ferritin: 12 ng/mL (ref 11–307)

## 2021-08-18 LAB — IRON AND TIBC
Iron: 164 ug/dL (ref 28–170)
Saturation Ratios: 36 % — ABNORMAL HIGH (ref 10.4–31.8)
TIBC: 455 ug/dL — ABNORMAL HIGH (ref 250–450)
UIBC: 291 ug/dL

## 2021-08-18 LAB — VITAMIN B12: Vitamin B-12: 258 pg/mL (ref 180–914)

## 2021-08-18 NOTE — Telephone Encounter (Signed)
Patient informed and she agrees to come in today for labs to be drawn.  Please add to lab schedule around 1:30.  Lab has been notified of add on lab for today.

## 2021-08-18 NOTE — Telephone Encounter (Signed)
Message received from Anders Grant, ins auth department:  patient is on the schedule for feraheme on 2/17. I requested the auth today but the auth request can not be processed without labs within the 4 weeks. I see she is scheduled on 2/17 for labs. If I submit the labs on 2/17 the determination will not be done in time.

## 2021-08-18 NOTE — Telephone Encounter (Signed)
done

## 2021-08-18 NOTE — Telephone Encounter (Signed)
Please cancel lab appt on 2/17

## 2021-08-22 ENCOUNTER — Encounter: Payer: Self-pay | Admitting: Internal Medicine

## 2021-08-25 ENCOUNTER — Encounter: Payer: Self-pay | Admitting: Internal Medicine

## 2021-08-25 ENCOUNTER — Inpatient Hospital Stay (HOSPITAL_BASED_OUTPATIENT_CLINIC_OR_DEPARTMENT_OTHER): Payer: BC Managed Care – PPO | Admitting: Internal Medicine

## 2021-08-25 ENCOUNTER — Other Ambulatory Visit: Payer: BC Managed Care – PPO

## 2021-08-25 ENCOUNTER — Other Ambulatory Visit: Payer: Self-pay

## 2021-08-25 ENCOUNTER — Inpatient Hospital Stay: Payer: BC Managed Care – PPO

## 2021-08-25 DIAGNOSIS — D500 Iron deficiency anemia secondary to blood loss (chronic): Secondary | ICD-10-CM | POA: Diagnosis not present

## 2021-08-25 DIAGNOSIS — D508 Other iron deficiency anemias: Secondary | ICD-10-CM | POA: Diagnosis not present

## 2021-08-25 MED ORDER — SODIUM CHLORIDE 0.9 % IV SOLN
Freq: Once | INTRAVENOUS | Status: AC
  Filled 2021-08-25: qty 250

## 2021-08-25 MED ORDER — CYANOCOBALAMIN 1000 MCG/ML IJ SOLN
1000.0000 ug | Freq: Once | INTRAMUSCULAR | Status: AC
  Administered 2021-08-25: 1000 ug via INTRAMUSCULAR
  Filled 2021-08-25: qty 1

## 2021-08-25 MED ORDER — SODIUM CHLORIDE 0.9 % IV SOLN
510.0000 mg | Freq: Once | INTRAVENOUS | Status: AC
  Administered 2021-08-25: 510 mg via INTRAVENOUS
  Filled 2021-08-25: qty 510

## 2021-08-25 NOTE — Progress Notes (Signed)
Pt states she is getting some numbness and tinglings in her fingers. Also, her left ring finger turns white.

## 2021-08-25 NOTE — Assessment & Plan Note (Addendum)
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  symptomatic.  Proceed with IV ferrahem.   #B12 deficiency-Dec 2021- 350+ B12 injection q 14M.   1 week- labs prior sec to insurance  # DISPOSITION: # B12 injec; Ferrahem today # Follow up in 6 months-MD;  1 week prior-labs cbc; b12 levels/ferritin/iron studies; possible ferrahem/ b12 injection-Dr.B    Cc;Dr.[name].

## 2021-08-25 NOTE — Progress Notes (Signed)
[facility] OFFICE PROGRESS NOTE  Patient Care Team: Birdie Sons, MD as PCP - General (Family Medicine) [place] Schimke, MD as Referring Physician (Internal Medicine) Bary Castilla Forest Gleason, MD as Consulting Physician (General Surgery) Schermerhorn, Gwen Her, MD as Referring Physician (Obstetrics and Gynecology)   SUMMARY OF ONCOLOGIC HISTORY:  # IDA [place] to gastric Bypass 2002/ menstrual blood loss]; IV iron q 6 M/ B12 IM [thru PCP]; B12 def-   # 2019- ? raynauds' s/p rheuma eval.   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  Left index finger- bluish; sore- no trauma. Lasted one week; resolved.   Patient denies any blood in stools or black or stools.  Admits to mild to moderate fatigue.   Review of Systems  Constitutional:  Positive for malaise/fatigue. Negative for chills, diaphoresis, fever and weight loss.  HENT:  Negative for nosebleeds and sore throat.   Eyes:  Negative for double vision.  Respiratory:  Negative for cough, hemoptysis, sputum production, shortness of breath and wheezing.   Cardiovascular:  Negative for chest pain, palpitations, orthopnea and leg swelling.  Gastrointestinal:  Negative for abdominal pain, blood in stool, constipation, diarrhea, heartburn, melena, nausea and vomiting.  Genitourinary:  Negative for dysuria, frequency and urgency.  Musculoskeletal:  Negative for back pain and joint pain.  Skin: Negative.  Negative for itching and rash.  Neurological:  Negative for dizziness, tingling, focal weakness, weakness and headaches.  Endo/Heme/Allergies:  Does not bruise/bleed easily.  Psychiatric/Behavioral:  Negative for depression. The patient has insomnia. The patient is not nervous/anxious.   Marland Kitchen   PAST MEDICAL HISTORY :  Past Medical History:  Diagnosis Date   Anemia    Insomnia    Obesity    Ovarian  cyst    Upper GI bleeding 2015   gastric ulcer    PAST SURGICAL HISTORY :   Past Surgical History:  Procedure Laterality Date   BREAST CYST ASPIRATION Left 02/02/2014   Dr Bary Castilla   BREAST CYST ASPIRATION Right 02/2018   CHOLECYSTECTOMY  2009   COLONOSCOPY  2014   GASTRIC BYPASS  2006, 2015   2015 revision   GASTRIC RESECTION  2015   SHOULDER ARTHROSCOPY WITH OPEN ROTATOR CUFF REPAIR Right 03/27/2018   Procedure: SHOULDER ARTHROSCOPY WITH OPEN ROTATOR CUFF REPAIR;  Surgeon: Corky Mull, MD;  Location: [facility] OR[facility];  Service: Orthopedics;  Laterality: Right;   SHOULDER CLOSED REDUCTION Right 07/08/2018   Procedure: CLOSED MANIPULATION SHOULDER;  Surgeon: Corky Mull, MD;  Location: [facility] OR[facility];  Service: Orthopedics;  Laterality: Right;   STERIOD INJECTION Right 07/08/2018   Procedure: STEROID INJECTION OF RIGHT SHOULDER;  Surgeon: Corky Mull, MD;  Location: [facility] OR[facility];  Service: Orthopedics;  Laterality: Right;   TONSILLECTOMY     TUBAL LIGATION     UPPER GI ENDOSCOPY      FAMILY HISTORY :   Family History  Problem Relation Age of Onset   Lung cancer Maternal Grandfather    Cancer Maternal Grandfather    Diabetes Mother    Hypertension Mother    Diabetes Father    Hypertension Father    Crohn's disease Son    Rheum arthritis Son    Asthma Son    Cancer Maternal Grandmother    Breast cancer Maternal Aunt        [phone]   Colon cancer Maternal Uncle  SOCIAL HISTORY:   Social History   Tobacco Use   Smoking status: Never   Smokeless tobacco: Never  Vaping Use   Vaping Use: Never used  Substance Use Topics   Alcohol use: No   Drug use: No    ALLERGIES:  is allergic to nsaids.  MEDICATIONS:  Current Outpatient Medications  Medication Sig Dispense Refill   acetaminophen (TYLENOL) 500 MG tablet Take 1,000 mg by mouth every 6 (six) hours as needed for moderate pain or headache.      amitriptyline (ELAVIL) 50 MG tablet Take 50 mg by mouth at bedtime as needed  for sleep.     Calcium-Magnesium-Vitamin D (CALCIUM 500 PO) Take 500 mg by mouth 2 (two) times daily.     CYANOCOBALAMIN IJ Inject as directed.     Magnesium 400 MG TABS Take 400 mg by mouth daily.     Probiotic CAPS Take 1 capsule by mouth daily.     Vitamin D, Ergocalciferol, (DRISDOL) 1.25 MG (50000 UNIT) CAPS capsule TAKE 1 CAPSULE ONCE A WEEK 12 capsule 4   No current facility-administered medications for this visit.   Facility-Administered Medications Ordered in Other Visits  Medication Dose Route Frequency Provider Last Rate Last Admin   0.9 %  sodium chloride infusion   Intravenous Once Earna Coder, MD       cyanocobalamin ((VITAMIN B-12)) injection 1,000 mcg  1,000 mcg Intramuscular Once Earna Coder, MD       ferumoxytol [facility]) 510 mg in sodium chloride 0.9 % 100 mL IVPB  510 mg Intravenous Once Earna Coder, MD        PHYSICAL EXAMINATION: ECOG PERFORMANCE STATUS: 0 - Asymptomatic  BP 137/71 (BP Location: Left Arm, Patient Position: Sitting, Cuff Size: Normal)    Pulse 65    Temp (!) 97.5 F (36.4 C) (Tympanic)    Wt 163 lb 3.2 oz (74 kg)    SpO2 100%    BMI 28.01 kg/m   Filed Weights   08/25/21 1356  Weight: 163 lb 3.2 oz (74 kg)    Physical Exam HENT:     Head: Normocephalic and atraumatic.     Mouth/Throat:     Pharynx: No oropharyngeal exudate.  Eyes:     Pupils: Pupils are equal, round, and reactive to light.  Cardiovascular:     Rate and Rhythm: Normal rate and regular rhythm.  Pulmonary:     Effort: No respiratory distress.     Breath sounds: No wheezing.  Abdominal:     General: Bowel sounds are normal. There is no distension.     Palpations: Abdomen is soft. There is no mass.     Tenderness: There is no abdominal tenderness. There is no guarding or rebound.  Musculoskeletal:        General: No tenderness. Normal range of motion.     Cervical back: Normal range of motion and neck supple.  Skin:    General: Skin is  warm.  Neurological:     Mental Status: She is alert and oriented to person, place, and time.  Psychiatric:        Mood and Affect: Affect normal.    LABORATORY DATA:  I have reviewed the data as listed    Component Value Date/Time   NA 139 11/20/2019 0859   NA 142 03/23/2013 0428   K 4.3 11/20/2019 0859   K 4.1 06/29/2013 0902   CL 104 11/20/2019 0859   CL 112 (H) 03/23/2013 0428  CO2 22 11/20/2019 0859   CO2 24 03/23/2013 0428   GLUCOSE 92 11/20/2019 0859   GLUCOSE 104 (H) 06/20/2018 1509   GLUCOSE 90 03/23/2013 0428   BUN 13 11/20/2019 0859   BUN 5 (L) 03/23/2013 0428   CREATININE 0.88 11/20/2019 0859   CREATININE 0.75 06/12/2013 0842   CALCIUM 8.5 (L) 11/20/2019 0859   CALCIUM 7.7 (L) 03/23/2013 0428   PROT 6.8 11/20/2019 0859   PROT 6.1 (L) 03/20/2013 1843   ALBUMIN 3.9 11/20/2019 0859   ALBUMIN 3.0 (L) 03/20/2013 1843   AST 20 11/20/2019 0859   AST 25 03/20/2013 1843   ALT 17 11/20/2019 0859   ALT 49 03/20/2013 1843   ALKPHOS 91 11/20/2019 0859   ALKPHOS 84 03/20/2013 1843   BILITOT 0.2 11/20/2019 0859   BILITOT 0.1 (L) 03/20/2013 1843   GFRNONAA 76 11/20/2019 0859   GFRNONAA >60 06/12/2013 0842   GFRAA 87 11/20/2019 0859   GFRAA >60 06/12/2013 0842    No results found for: SPEP, UPEP  Lab Results  Component Value Date   WBC 4.2 08/18/2021   NEUTROABS 2.1 08/18/2021   HGB 11.3 (L) 08/18/2021   HCT 34.4 (L) 08/18/2021   MCV 93.7 08/18/2021   PLT 243 08/18/2021      Chemistry      Component Value Date/Time   NA 139 11/20/2019 0859   NA 142 03/23/2013 0428   K 4.3 11/20/2019 0859   K 4.1 06/29/2013 0902   CL 104 11/20/2019 0859   CL 112 (H) 03/23/2013 0428   CO2 22 11/20/2019 0859   CO2 24 03/23/2013 0428   BUN 13 11/20/2019 0859   BUN 5 (L) 03/23/2013 0428   CREATININE 0.88 11/20/2019 0859   CREATININE 0.75 06/12/2013 0842   GLU 82 10/16/2013 0000      Component Value Date/Time   CALCIUM 8.5 (L) 11/20/2019 0859   CALCIUM 7.7 (L)  03/23/2013 0428   ALKPHOS 91 11/20/2019 0859   ALKPHOS 84 03/20/2013 1843   AST 20 11/20/2019 0859   AST 25 03/20/2013 1843   ALT 17 11/20/2019 0859   ALT 49 03/20/2013 1843   BILITOT 0.2 11/20/2019 0859   BILITOT 0.1 (L) 03/20/2013 1843        ASSESSMENT & PLAN:  Acquired iron deficiency anemia due to decreased absorption # 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  symptomatic.  Proceed with IV ferrahem.   #B12 deficiency-Dec 2021- 350+ B12 injection q 54M.   1 week- labs prior sec to insurance  # DISPOSITION: # B12 injec; Ferrahem today # Follow up in 6 months-MD;  1 week prior-labs cbc; b12 levels/ferritin/iron studies; possible ferrahem/ b12 injection-Dr.B    Cc;Dr.[name].      Cammie Sickle, MD 08/25/2021 2:32 PM

## 2021-08-25 NOTE — Patient Instructions (Signed)
Ferumoxytol Injection What is this medication? FERUMOXYTOL (FER ue MOX i tol) treats low levels of iron in your body (iron deficiency anemia). Iron is a mineral that plays an important role in making red blood cells, which carry oxygen from your lungs to the rest of your body. This medicine may be used for other purposes; ask your health care provider or pharmacist if you have questions. COMMON BRAND NAME(S): Feraheme What should I tell my care team before I take this medication? They need to know if you have any of these conditions: Anemia not caused by low iron levels High levels of iron in the blood Magnetic resonance imaging (MRI) test scheduled An unusual or allergic reaction to iron, other medications, foods, dyes, or preservatives Pregnant or trying to get pregnant Breast-feeding How should I use this medication? This medication is for injection into a vein. It is given in a hospital or clinic setting. Talk to your care team the use of this medication in children. Special care may be needed. Overdosage: If you think you have taken too much of this medicine contact a poison control center or emergency room at once. NOTE: This medicine is only for you. Do not share this medicine with others. What if I miss a dose? It is important not to miss your dose. Call your care team if you are unable to keep an appointment. What may interact with this medication? Other iron products This list may not describe all possible interactions. Give your health care provider a list of all the medicines, herbs, non-prescription drugs, or dietary supplements you use. Also tell them if you smoke, drink alcohol, or use illegal drugs. Some items may interact with your medicine. What should I watch for while using this medication? Visit your care team regularly. Tell your care team if your symptoms do not start to get better or if they get worse. You may need blood work done while you are taking this  medication. You may need to follow a special diet. Talk to your care team. Foods that contain iron include: whole grains/cereals, dried fruits, beans, or peas, leafy green vegetables, and organ meats (liver, kidney). What side effects may I notice from receiving this medication? Side effects that you should report to your care team as soon as possible: Allergic reactions--skin rash, itching, hives, swelling of the face, lips, tongue, or throat Low blood pressure--dizziness, feeling faint or lightheaded, blurry vision Shortness of breath Side effects that usually do not require medical attention (report to your care team if they continue or are bothersome): Flushing Headache Joint pain Muscle pain Nausea Pain, redness, or irritation at injection site This list may not describe all possible side effects. Call your doctor for medical advice about side effects. You may report side effects to FDA at 1-800-FDA-1088. Where should I keep my medication? This medication is given in a hospital or clinic and will not be stored at home. NOTE: This sheet is a summary. It may not cover all possible information. If you have questions about this medicine, talk to your doctor, pharmacist, or health care provider.  2022 Elsevier/Gold Standard (2020-11-18 00:00:00)    Vitamin B12 Injection What is this medication? Vitamin B12 (VAHY tuh min B12) prevents and treats low vitamin B12 levels in your body. It is used in people who do not get enough vitamin B12 from their diet or when their digestive tract does not absorb enough. Vitamin B12 plays an important role in maintaining the health of your nervous   system and red blood cells. This medicine may be used for other purposes; ask your health care provider or pharmacist if you have questions. COMMON BRAND NAME(S): B-12 Compliance Kit, B-12 Injection Kit, Cyomin, Dodex, LA-12, Nutri-Twelve, Physicians EZ Use B-12, Primabalt What should I tell my care team before I  take this medication? They need to know if you have any of these conditions: Kidney disease Leber's disease Megaloblastic anemia An unusual or allergic reaction to cyanocobalamin, cobalt, other medications, foods, dyes, or preservatives Pregnant or trying to get pregnant Breast-feeding How should I use this medication? This medication is injected into a muscle or deeply under the skin. It is usually given in a clinic or care team's office. However, your care team may teach you how to inject yourself. Follow all instructions. Talk to your care team about the use of this medication in children. Special care may be needed. Overdosage: If you think you have taken too much of this medicine contact a poison control center or emergency room at once. NOTE: This medicine is only for you. Do not share this medicine with others. What if I miss a dose? If you are given your dose at a clinic or care team's office, call to reschedule your appointment. If you give your own injections, and you miss a dose, take it as soon as you can. If it is almost time for your next dose, take only that dose. Do not take double or extra doses. What may interact with this medication? Colchicine Heavy alcohol intake This list may not describe all possible interactions. Give your health care provider a list of all the medicines, herbs, non-prescription drugs, or dietary supplements you use. Also tell them if you smoke, drink alcohol, or use illegal drugs. Some items may interact with your medicine. What should I watch for while using this medication? Visit your care team regularly. You may need blood work done while you are taking this medication. You may need to follow a special diet. Talk to your care team. Limit your alcohol intake and avoid smoking to get the best benefit. What side effects may I notice from receiving this medication? Side effects that you should report to your care team as soon as possible: Allergic  reactions--skin rash, itching, hives, swelling of the face, lips, tongue, or throat Swelling of the ankles, hands, or feet Trouble breathing Side effects that usually do not require medical attention (report to your care team if they continue or are bothersome): Diarrhea This list may not describe all possible side effects. Call your doctor for medical advice about side effects. You may report side effects to FDA at 1-800-FDA-1088. Where should I keep my medication? Keep out of the reach of children. Store at room temperature between 15 and 30 degrees C (59 and 85 degrees F). Protect from light. Throw away any unused medication after the expiration date. NOTE: This sheet is a summary. It may not cover all possible information. If you have questions about this medicine, talk to your doctor, pharmacist, or health care provider.  2022 Elsevier/Gold Standard (2020-09-07 00:00:00)  

## 2021-08-28 ENCOUNTER — Encounter: Payer: Self-pay | Admitting: Family Medicine

## 2021-09-08 ENCOUNTER — Encounter: Payer: Self-pay | Admitting: Family Medicine

## 2021-09-08 DIAGNOSIS — E668 Other obesity: Secondary | ICD-10-CM

## 2021-09-13 ENCOUNTER — Other Ambulatory Visit: Payer: Self-pay | Admitting: Family Medicine

## 2021-09-13 DIAGNOSIS — E668 Other obesity: Secondary | ICD-10-CM

## 2021-09-13 MED ORDER — SEMAGLUTIDE-WEIGHT MANAGEMENT 0.25 MG/0.5ML ~~LOC~~ SOAJ: 0.2500 mg | SUBCUTANEOUS | 1 refills | Status: DC

## 2021-09-14 ENCOUNTER — Encounter: Payer: Self-pay | Admitting: Family Medicine

## 2021-09-14 DIAGNOSIS — E668 Other obesity: Secondary | ICD-10-CM

## 2021-09-15 ENCOUNTER — Ambulatory Visit: Payer: BC Managed Care – PPO | Admitting: Family Medicine

## 2021-09-26 ENCOUNTER — Telehealth: Payer: Self-pay | Admitting: Family Medicine

## 2021-09-26 NOTE — Telephone Encounter (Addendum)
Pt requesting a call back and requesting an update on PA for [facility]. ? ?Pt asked if there was anything she could do to speed up this process.  ? ?Pt stated if she does not answer her cell phone please call her work phone at [phone] . ? ?Please advise. ? ? ?

## 2021-09-27 ENCOUNTER — Ambulatory Visit: Payer: Self-pay | Admitting: *Deleted

## 2021-09-27 NOTE — Telephone Encounter (Signed)
Prior authorization approved from 07/30/2021 through 03/29/2022. See letter in Media section.  ? ?[facility]. Please advise if ok to send to mail order. She says she can get the medication cheaper if she uses mail order.  ?

## 2021-09-27 NOTE — Addendum Note (Signed)
Addended by: Randal Buba on: 09/27/2021 03:10 PM ? ? Modules accepted: Orders ? ?

## 2021-09-27 NOTE — Telephone Encounter (Signed)
Pt called in because she was advised to talk with nurse triage regarding a prior authorization for [facility].   ? ?I needing PA for a medication per pt.    Roshena called me and I'm returning her call from Dr. Theodis Aguas office. ? ?I warm transferred the call into [facility] to [place]. ?Reason for Disposition ? [1] Caller requesting NON-URGENT health information AND [2] PCP's office is the best resource ? ?Answer Assessment - Initial Assessment Questions ?1. REASON FOR CALL or QUESTION: "What is your reason for calling today?" or "How can I best help you?" or "What question do you have that I can help answer?" ?    Pt returning a call to Roshena in Dr. Theodis Aguas office regarding a prior authorization for [facility]. ? ?I warm transferred the call into [facility] to Roshena. ? ?Protocols used: Information Only Call - No Triage-A-AH ? ?

## 2021-09-27 NOTE — Telephone Encounter (Signed)
I tried calling patient. Left message to call back. Detailed Mychart message also sent to patient.  ?

## 2021-09-30 MED ORDER — SEMAGLUTIDE-WEIGHT MANAGEMENT 0.25 MG/0.5ML ~~LOC~~ SOAJ: 0.2500 mg | SUBCUTANEOUS | 0 refills | Status: DC

## 2021-09-30 NOTE — Addendum Note (Signed)
Addended by: Malva Limes on: 09/30/2021 09:58 AM ? ? Modules accepted: Orders ? ?

## 2021-10-26 ENCOUNTER — Other Ambulatory Visit: Payer: Self-pay | Admitting: Family Medicine

## 2021-10-26 DIAGNOSIS — E668 Other obesity: Secondary | ICD-10-CM

## 2021-10-26 MED ORDER — WEGOVY 1 MG/0.5ML ~~LOC~~ SOAJ: 1.0000 mg | SUBCUTANEOUS | 0 refills | Status: DC

## 2021-11-02 IMAGING — CR DG FOOT COMPLETE 3+V*L*
1 series · 3 of 3 positions shown · non-contrast
Comparison: None.

CLINICAL DATA: Pain in left great toe.  Suspected gout.

EXAM:
LEFT FOOT - COMPLETE 3+ VIEW

[Series 1: dg foot complete left · 0.14mm/px · 3 of 3 slices shown]
[im 1/3]
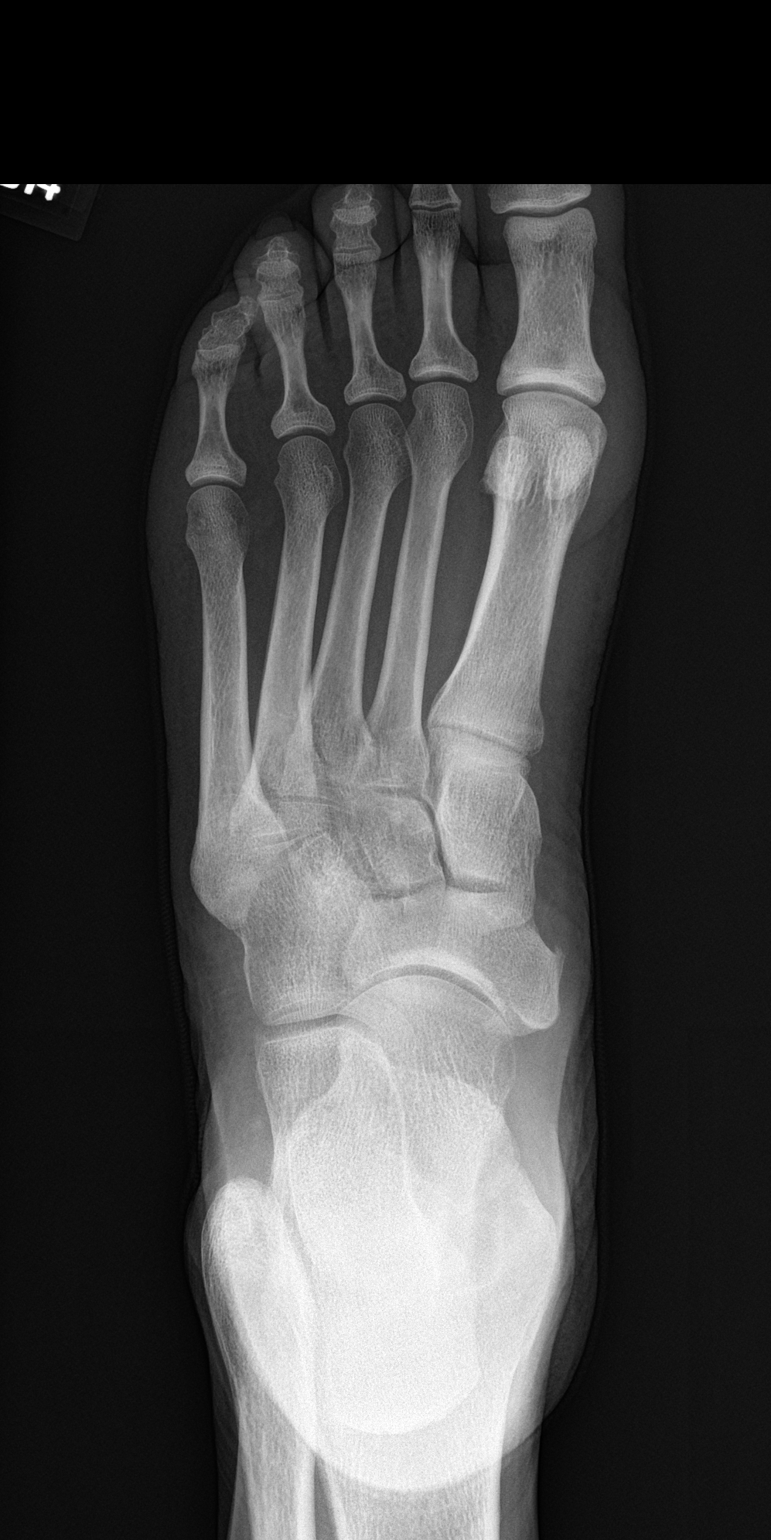
[im 2/3]
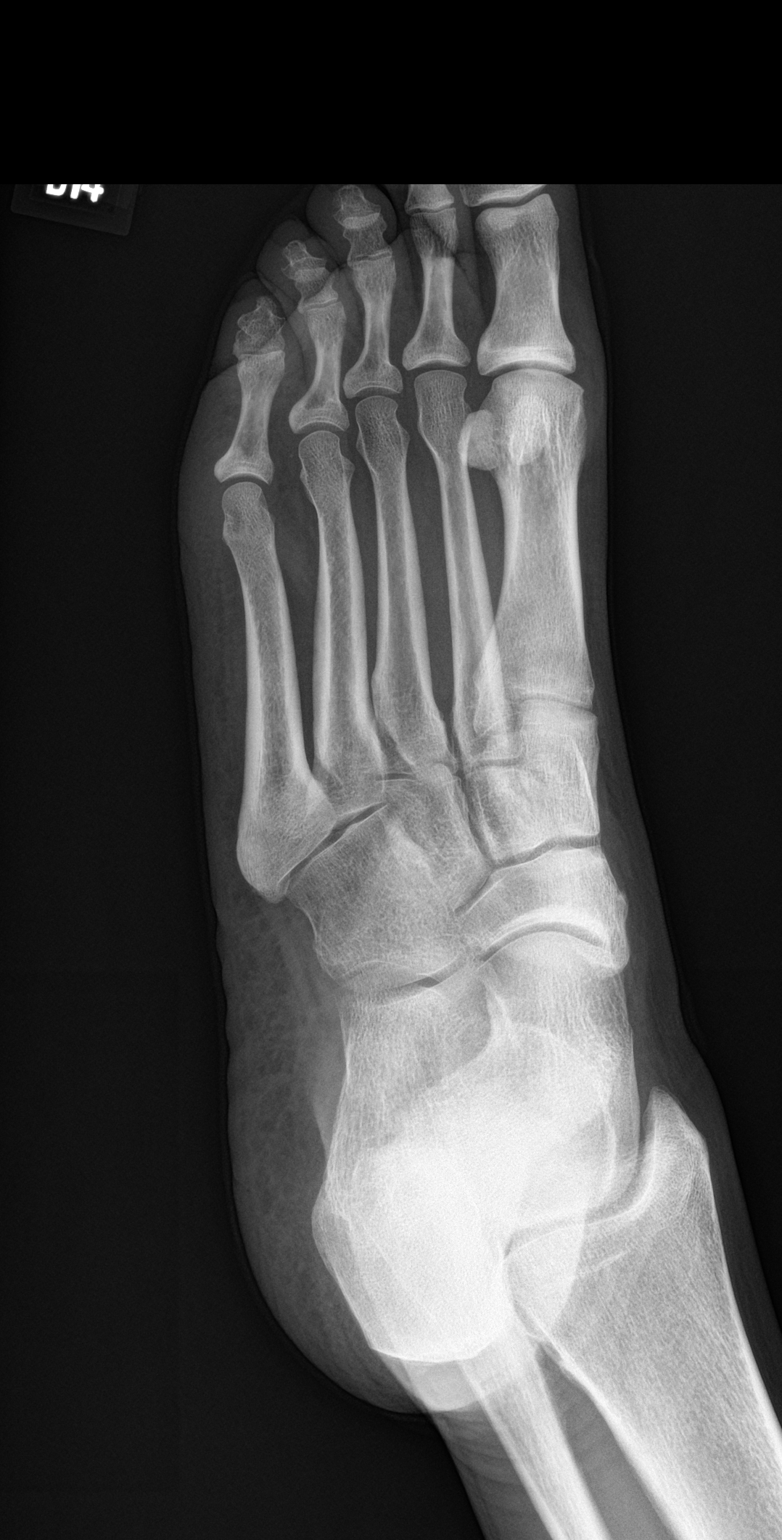
[im 3/3]
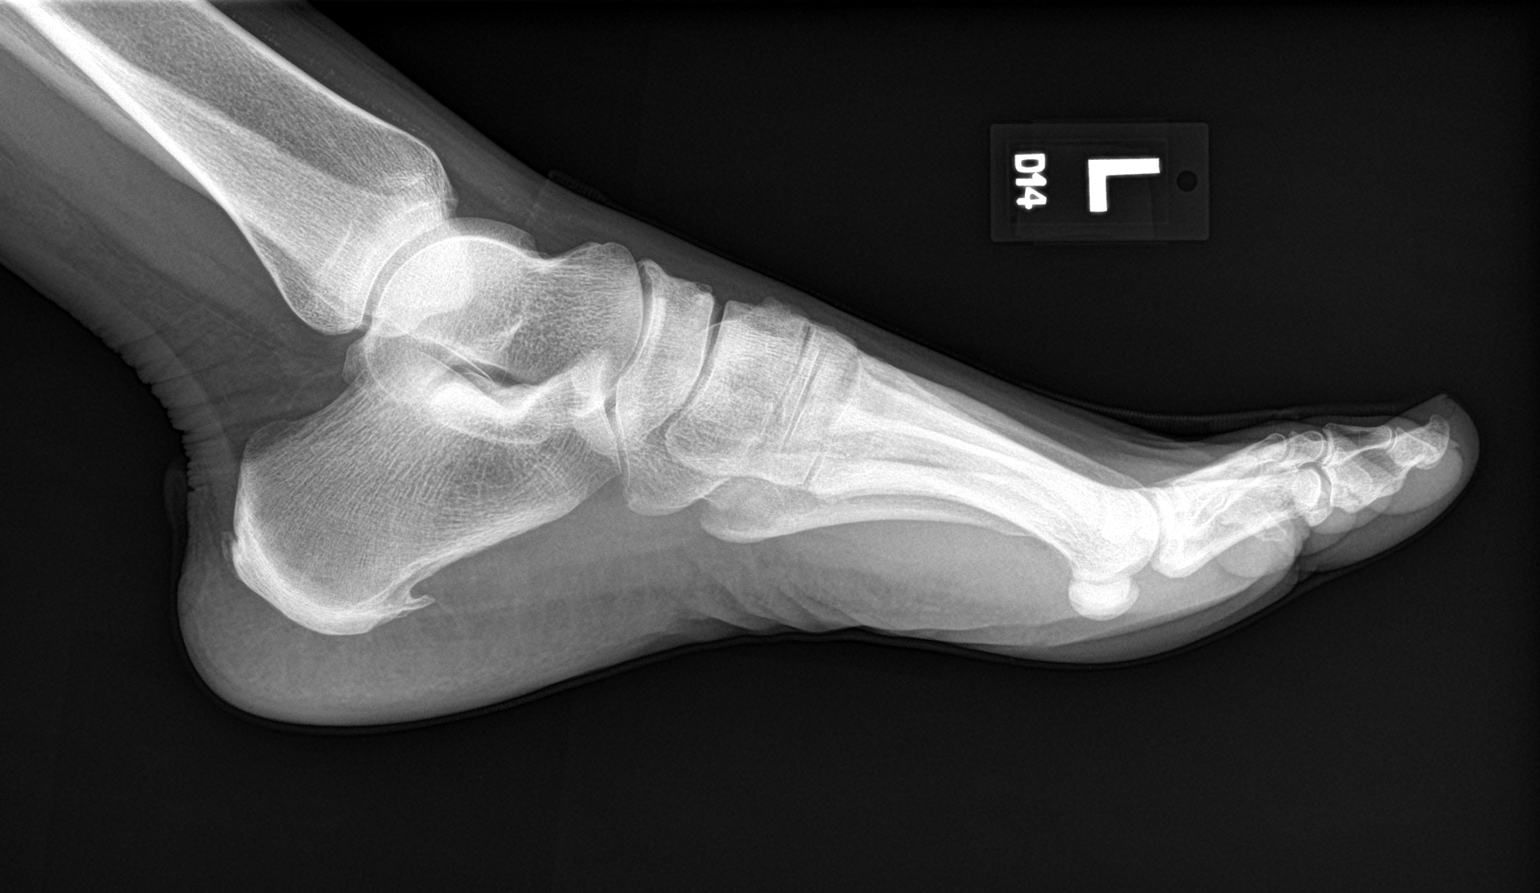

[3 of 3 positions shown; findings below may reference images not displayed]

FINDINGS: There is no evidence of fracture or dislocation. There is no
evidence of arthropathy or other focal bone abnormality. Soft
tissues are unremarkable.
IMPRESSION: Negative.

## 2021-11-09 NOTE — Progress Notes (Signed)
?  ? ? ?  Established patient visit ? ? ?I,Tiffany J Bragg,acting as a scribe for Lelon Huh, MD.,have documented all relevant documentation on the behalf of Lelon Huh, MD,as directed by  Lelon Huh, MD while in the presence of Lelon Huh, MD.  ? ?Patient: Laura Murray   DOB: 03-14-1966   56 y.o. Female  MRN: [id number] ?Visit Date: 11/10/2021 ? ?Today's healthcare provider: Lelon Huh, MD  ? ?Chief Complaint  ?Patient presents with  ? Weight Loss  ? ?Subjective  ?  ?HPI  ?Follow up for weight management: ? ?Patient started [facility] March, 2023. ? ?She reports excellent compliance with treatment. ?She feels that condition is Unchanged. ?She is not having side effects.  ? ?Wt Readings from Last 3 Encounters:  ?11/10/21 163 lb 12.8 oz (74.3 kg)  ?08/25/21 163 lb 3.2 oz (74 kg)  ?03/17/21 149 lb (67.6 kg)  ?  ? ?-----------------------------------------------------------------------------------------  ? ?Medications: ?Outpatient Medications Prior to Visit  ?Medication Sig  ? acetaminophen (TYLENOL) 500 MG tablet Take 1,000 mg by mouth every 6 (six) hours as needed for moderate pain or headache.   ? amitriptyline (ELAVIL) 50 MG tablet Take 50 mg by mouth at bedtime as needed for sleep.  ? Calcium-Magnesium-Vitamin D (CALCIUM 500 PO) Take 500 mg by mouth 2 (two) times daily.  ? CYANOCOBALAMIN IJ Inject as directed.  ? Magnesium 400 MG TABS Take 400 mg by mouth daily.  ? Probiotic CAPS Take 1 capsule by mouth daily.  ? Semaglutide-Weight Management (WEGOVY) 1 MG/0.5ML SOAJ Inject 1 mg into the skin once a week.  ? Vitamin D, Ergocalciferol, (DRISDOL) 1.25 MG (50000 UNIT) CAPS capsule TAKE 1 CAPSULE ONCE A WEEK  ? ?No facility-administered medications prior to visit.  ? ? ?Review of Systems ? ? ?  Objective  ?  ?BP 126/61 (BP Location: Right Arm, Patient Position: Sitting, Cuff Size: Normal)   Pulse 65   Temp 98.1 ?F (36.7 ?C)   Resp 16   Ht 5\' 4"  (1.626 m)   Wt 163 lb 12.8 oz (74.3 kg)   SpO2 100%    BMI 28.12 kg/m?  ? ? ?Physical Exam  ?General appearance:  ?Overweight female, cooperative and in no acute distress ?Head: Normocephalic, without obvious abnormality, atraumatic ?Respiratory: Respirations even and unlabored, normal respiratory rate ?Extremities: All extremities are intact.  ?Skin: Skin color, texture, turgor normal. No rashes seen  ?Psych: Appropriate mood and affect. ?Neurologic: Mental status: Alert, oriented to person, place, and time, thought content appropriate.  ? Assessment & Plan  ?  ? ?1. Vitamin D deficiency ? ?- VITAMIN D 25 Hydroxy (Vit-D Deficiency, Fractures) ? ?2. Weight gain ? ?- TSH ?- Comprehensive metabolic panel  ? ?Is tolerating 1 mg dose of [place] well. Will send in 1.7 mg prescription before she finishes 4th dose, then increase to  2.4  Anticipate follow up after titrating to maximum tolerated dose.  ?   ? ?The entirety of the information documented in the History of Present Illness, Review of Systems and Physical Exam were personally obtained by me. Portions of this information were initially documented by the CMA and reviewed by me for thoroughness and accuracy.   ? ? ?Lelon Huh, MD  ?[facility] ?[phone] (phone) ?[phone] (fax) ? ?[facility]  ?

## 2021-11-10 ENCOUNTER — Ambulatory Visit: Payer: BC Managed Care – PPO | Admitting: Family Medicine

## 2021-11-10 VITALS — BP 126/61 | HR 65 | Temp 98.1°F | Resp 16 | Ht 64.0 in | Wt 163.8 lb

## 2021-11-10 DIAGNOSIS — E559 Vitamin D deficiency, unspecified: Secondary | ICD-10-CM | POA: Diagnosis not present

## 2021-11-10 DIAGNOSIS — R635 Abnormal weight gain: Secondary | ICD-10-CM | POA: Diagnosis not present

## 2021-11-11 LAB — VITAMIN D 25 HYDROXY (VIT D DEFICIENCY, FRACTURES): Vit D, 25-Hydroxy: 71.6 ng/mL (ref 30.0–100.0)

## 2021-11-11 LAB — COMPREHENSIVE METABOLIC PANEL
ALT: 23 IU/L (ref 0–32)
AST: 22 IU/L (ref 0–40)
Albumin/Globulin Ratio: 1.4 (ref 1.2–2.2)
Albumin: 4.2 g/dL (ref 3.8–4.9)
Alkaline Phosphatase: 91 IU/L (ref 44–121)
BUN/Creatinine Ratio: 16 (ref 9–23)
BUN: 12 mg/dL (ref 6–24)
Bilirubin Total: 0.3 mg/dL (ref 0.0–1.2)
CO2: 22 mmol/L (ref 20–29)
Calcium: 8.9 mg/dL (ref 8.7–10.2)
Chloride: 102 mmol/L (ref 96–106)
Creatinine, Ser: 0.74 mg/dL (ref 0.57–1.00)
Globulin, Total: 3 g/dL (ref 1.5–4.5)
Glucose: 83 mg/dL (ref 70–99)
Potassium: 4.1 mmol/L (ref 3.5–5.2)
Sodium: 139 mmol/L (ref 134–144)
Total Protein: 7.2 g/dL (ref 6.0–8.5)
eGFR: 96 mL/min/{1.73_m2} (ref >59)

## 2021-11-11 LAB — TSH: TSH: 1.31 u[IU]/mL (ref 0.450–4.500)

## 2021-11-16 ENCOUNTER — Other Ambulatory Visit: Payer: Self-pay | Admitting: Family Medicine

## 2021-11-16 DIAGNOSIS — E663 Overweight: Secondary | ICD-10-CM

## 2021-11-16 MED ORDER — WEGOVY 1.7 MG/0.75ML ~~LOC~~ SOAJ: 1.7000 mg | SUBCUTANEOUS | 0 refills | Status: DC

## 2021-12-01 ENCOUNTER — Other Ambulatory Visit: Payer: Self-pay | Admitting: Family Medicine

## 2021-12-01 DIAGNOSIS — E559 Vitamin D deficiency, unspecified: Secondary | ICD-10-CM

## 2021-12-02 ENCOUNTER — Encounter: Payer: Self-pay | Admitting: Family Medicine

## 2021-12-02 DIAGNOSIS — E663 Overweight: Secondary | ICD-10-CM

## 2021-12-05 MED ORDER — WEGOVY 1.7 MG/0.75ML ~~LOC~~ SOAJ: 1.7000 mg | SUBCUTANEOUS | 0 refills | Status: DC

## 2021-12-26 ENCOUNTER — Other Ambulatory Visit: Payer: Self-pay | Admitting: Family Medicine

## 2021-12-26 DIAGNOSIS — E663 Overweight: Secondary | ICD-10-CM

## 2021-12-26 MED ORDER — WEGOVY 2.4 MG/0.75ML ~~LOC~~ SOAJ: 2.4000 mg | SUBCUTANEOUS | 0 refills | Status: DC

## 2021-12-28 MED ORDER — ONDANSETRON 4 MG PO TBDP: 4.0000 mg | ORAL_TABLET | Freq: Three times a day (TID) | ORAL | 1 refills | Status: DC | PRN

## 2021-12-28 NOTE — Addendum Note (Signed)
Addended by: Malva Limes on: 12/28/2021 01:24 PM   Modules accepted: Orders

## 2022-01-16 NOTE — Progress Notes (Signed)
I,Roshena L Chambers,acting as a scribe for Mila Merry, MD.,have documented all relevant documentation on the behalf of Mila Merry, MD,as directed by  Mila Merry, MD while in the presence of Mila Merry, MD.   Established patient visit   Patient: Laura Murray   DOB: [birth year]   56 y.o. Female  MRN: [id number] Visit Date: 01/17/2022  Today's healthcare provider: Mila Merry, MD   Chief Complaint  Patient presents with   Weight Management Screening   Subjective    HPI  Follow up for weight management  The patient was last seen for this 2 months ago. Changes made at last visit include increasing semaglutide to 1.7 mg. She has had some nausea with increased dose, but has started taking ondansetron for 1-2 days after injection which has worked well. She received 3 months supply of 2.4mg  dose from her mail order and is scheduled to start it this weekend.   She reports good compliance with treatment. She feels that condition is Improved.   Wt Readings from Last 3 Encounters:  01/17/22 153 lb (69.4 kg)  11/10/21 163 lb 12.8 oz (74.3 kg)  08/25/21 163 lb 3.2 oz (74 kg)    -----------------------------------------------------------------------------------------   Medications: Outpatient Medications Prior to Visit  Medication Sig   acetaminophen (TYLENOL) 500 MG tablet Take 1,000 mg by mouth every 6 (six) hours as needed for moderate pain or headache.    amitriptyline (ELAVIL) 50 MG tablet Take 50 mg by mouth at bedtime as needed for sleep.   Calcium-Magnesium-Vitamin D (CALCIUM 500 PO) Take 500 mg by mouth 2 (two) times daily.   CYANOCOBALAMIN IJ Inject as directed.   Magnesium 400 MG TABS Take 400 mg by mouth daily.   ondansetron (ZOFRAN-ODT) 4 MG disintegrating tablet Take 1 tablet (4 mg total) by mouth every 8 (eight) hours as needed for nausea or vomiting.   Probiotic CAPS Take 1 capsule by mouth daily.   Semaglutide-Weight Management (WEGOVY) 2.4  MG/0.75ML SOAJ Inject 2.4 mg into the skin once a week.   Vitamin D, Ergocalciferol, (DRISDOL) 1.25 MG (50000 UNIT) CAPS capsule TAKE 1 CAPSULE ONCE WEEKLY   No facility-administered medications prior to visit.    Review of Systems  Constitutional:  Negative for appetite change, chills, fatigue and fever.  Respiratory:  Negative for chest tightness and shortness of breath.   Cardiovascular:  Negative for chest pain and palpitations.  Gastrointestinal:  Negative for abdominal pain, nausea and vomiting.  Neurological:  Negative for dizziness and weakness.       Objective    BP 111/63 (BP Location: Right Arm, Patient Position: Sitting, Cuff Size: Normal)   Pulse 73   Temp 97.9 F (36.6 C) (Oral)   Resp 14   Ht 5\' 4"  (1.626 m)   Wt 153 lb (69.4 kg)   SpO2 100% Comment: room air  BMI 26.26 kg/m  BP Readings from Last 3 Encounters:  01/17/22 111/63  11/10/21 126/61  08/25/21 137/71   Wt Readings from Last 3 Encounters:  01/17/22 153 lb (69.4 kg)  11/10/21 163 lb 12.8 oz (74.3 kg)  08/25/21 163 lb 3.2 oz (74 kg)    Physical Exam   General appearance: Well developed, well nourished female, cooperative and in no acute distress Head: Normocephalic, without obvious abnormality, atraumatic Respiratory: Respirations even and unlabored, normal respiratory rate Extremities: All extremities are intact.  Skin: Skin color, texture, turgor normal. No rashes seen  Psych: Appropriate mood and affect. Neurologic: Mental status: Alert,  oriented to person, place, and time, thought content appropriate.   Assessment & Plan     1. Overweight (BMI 25.0-29.9) Good response to [facility] although she is having some nausea requiring 1-2 days of ondansetron.  Her next dose is set to increase to 2.4mg . If not tolerated will consider going back down to lower dose. Otherwise is to follow up in 2 months.      The entirety of the information documented in the History of Present Illness, Review of  Systems and Physical Exam were personally obtained by me. Portions of this information were initially documented by the CMA and reviewed by me for thoroughness and accuracy.     Mila Merry, MD  [facility] [phone] (phone) [phone] (fax)  [facility]

## 2022-01-17 ENCOUNTER — Encounter: Payer: Self-pay | Admitting: Family Medicine

## 2022-01-17 ENCOUNTER — Ambulatory Visit: Payer: BC Managed Care – PPO | Admitting: Family Medicine

## 2022-01-17 VITALS — BP 111/63 | HR 73 | Temp 97.9°F | Resp 14 | Ht 64.0 in | Wt 153.0 lb

## 2022-01-17 DIAGNOSIS — E663 Overweight: Secondary | ICD-10-CM | POA: Diagnosis not present

## 2022-01-19 ENCOUNTER — Ambulatory Visit: Payer: BC Managed Care – PPO | Admitting: Family Medicine

## 2022-02-16 ENCOUNTER — Inpatient Hospital Stay: Payer: BC Managed Care – PPO | Attending: Oncology

## 2022-02-16 DIAGNOSIS — D508 Other iron deficiency anemias: Secondary | ICD-10-CM | POA: Diagnosis not present

## 2022-02-16 DIAGNOSIS — Z9049 Acquired absence of other specified parts of digestive tract: Secondary | ICD-10-CM | POA: Diagnosis not present

## 2022-02-16 DIAGNOSIS — E538 Deficiency of other specified B group vitamins: Secondary | ICD-10-CM | POA: Insufficient documentation

## 2022-02-16 DIAGNOSIS — Z9884 Bariatric surgery status: Secondary | ICD-10-CM | POA: Diagnosis not present

## 2022-02-16 DIAGNOSIS — K912 Postsurgical malabsorption, not elsewhere classified: Secondary | ICD-10-CM | POA: Insufficient documentation

## 2022-02-16 DIAGNOSIS — N920 Excessive and frequent menstruation with regular cycle: Secondary | ICD-10-CM | POA: Insufficient documentation

## 2022-02-16 LAB — CBC WITH DIFFERENTIAL/PLATELET
Abs Immature Granulocytes: 0.01 10*3/uL (ref 0.00–0.07)
Basophils Absolute: 0 10*3/uL (ref 0.0–0.1)
Basophils Relative: 0 %
Eosinophils Absolute: 0.1 10*3/uL (ref 0.0–0.5)
Eosinophils Relative: 3 %
HCT: 36.2 % (ref 36.0–46.0)
Hemoglobin: 12.1 g/dL (ref 12.0–15.0)
Immature Granulocytes: 0 %
Lymphocytes Relative: 29 %
Lymphs Abs: 1.3 10*3/uL (ref 0.7–4.0)
MCH: 31.1 pg (ref 26.0–34.0)
MCHC: 33.4 g/dL (ref 30.0–36.0)
MCV: 93.1 fL (ref 80.0–100.0)
Monocytes Absolute: 0.4 10*3/uL (ref 0.1–1.0)
Monocytes Relative: 9 %
Neutro Abs: 2.7 10*3/uL (ref 1.7–7.7)
Neutrophils Relative %: 59 %
Platelets: 237 10*3/uL (ref 150–400)
RBC: 3.89 MIL/uL (ref 3.87–5.11)
RDW: 12.8 % (ref 11.5–15.5)
WBC: 4.5 10*3/uL (ref 4.0–10.5)
nRBC: 0 % (ref 0.0–0.2)

## 2022-02-16 LAB — IRON AND TIBC
Iron: 102 ug/dL (ref 28–170)
Saturation Ratios: 28 % (ref 10.4–31.8)
TIBC: 368 ug/dL (ref 250–450)
UIBC: 266 ug/dL

## 2022-02-16 LAB — VITAMIN B12: Vitamin B-12: 256 pg/mL (ref 180–914)

## 2022-02-16 LAB — FERRITIN: Ferritin: 55 ng/mL (ref 11–307)

## 2022-02-22 MED FILL — Ferumoxytol Inj 510 MG/17ML (30 MG/ML) (Elemental Fe): INTRAVENOUS | Qty: 17 | Status: AC

## 2022-02-23 ENCOUNTER — Inpatient Hospital Stay (HOSPITAL_BASED_OUTPATIENT_CLINIC_OR_DEPARTMENT_OTHER): Payer: BC Managed Care – PPO | Admitting: Internal Medicine

## 2022-02-23 ENCOUNTER — Encounter: Payer: Self-pay | Admitting: Internal Medicine

## 2022-02-23 ENCOUNTER — Inpatient Hospital Stay: Payer: BC Managed Care – PPO

## 2022-02-23 DIAGNOSIS — D500 Iron deficiency anemia secondary to blood loss (chronic): Secondary | ICD-10-CM

## 2022-02-23 DIAGNOSIS — D508 Other iron deficiency anemias: Secondary | ICD-10-CM | POA: Diagnosis not present

## 2022-02-23 MED ORDER — CYANOCOBALAMIN 1000 MCG/ML IJ SOLN
1000.0000 ug | Freq: Once | INTRAMUSCULAR | Status: AC
  Administered 2022-02-23: 1000 ug via INTRAMUSCULAR
  Filled 2022-02-23: qty 1

## 2022-02-23 NOTE — Progress Notes (Signed)
Pt thinks her insurance denied her infusion.

## 2022-02-23 NOTE — Assessment & Plan Note (Addendum)
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  Symptomatic.  Proceed with IV ferrahem.   # B12 deficiency-Dec 2021- AUG 2023- B12 injection q 6M.  1 week- labs prior sec to insurance  # DISPOSITION: # B12 injection ONLY; ; HOLD off Ferrahem today # Follow up in 6 months-MD;  1 week prior-labs cbc; b12 levels/ferritin/iron studies; possible ferrahem/ b12 injection-Dr.B   Cc;Dr.[name].

## 2022-02-23 NOTE — Progress Notes (Signed)
[facility] OFFICE PROGRESS NOTE  Patient Care Team: Malva Limes, MD as PCP - General (Family Medicine) Marin Roberts, MD as Referring Physician (Internal Medicine) Lemar Livings Merrily Pew, MD as Consulting Physician (General Surgery) Schermerhorn, Ihor Austin, MD as Referring Physician (Obstetrics and Gynecology)   SUMMARY OF ONCOLOGIC HISTORY:  # IDA [place] Breeding to gastric Bypass 2002/ menstrual blood loss]; IV iron q 6 M/ B12 IM [thru PCP]; B12 def-   # 2019- ? raynauds' s/p rheuma eval.   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  Patient currently on semaglutide for weight loss.  Seems to be helping.  Patient denies any blood in stools or black or stools.  Denies any worsening fatigue.  Review of Systems  Constitutional:  Positive for malaise/fatigue. Negative for chills, diaphoresis, fever and weight loss.  HENT:  Negative for nosebleeds and sore throat.   Eyes:  Negative for double vision.  Respiratory:  Negative for cough, hemoptysis, sputum production, shortness of breath and wheezing.   Cardiovascular:  Negative for chest pain, palpitations, orthopnea and leg swelling.  Gastrointestinal:  Negative for abdominal pain, blood in stool, constipation, diarrhea, heartburn, melena, nausea and vomiting.  Genitourinary:  Negative for dysuria, frequency and urgency.  Musculoskeletal:  Negative for back pain and joint pain.  Skin: Negative.  Negative for itching and rash.  Neurological:  Negative for dizziness, tingling, focal weakness, weakness and headaches.  Endo/Heme/Allergies:  Does not bruise/bleed easily.  Psychiatric/Behavioral:  Negative for depression. The patient has insomnia. The patient is not nervous/anxious.    Marland Kitchen   PAST MEDICAL HISTORY :  Past Medical History:  Diagnosis Date   Anemia    Insomnia    Obesity    Ovarian cyst     Upper GI bleeding 2015   gastric ulcer    PAST SURGICAL HISTORY :   Past Surgical History:  Procedure Laterality Date   BREAST CYST ASPIRATION Left 02/02/2014   Dr Lemar Livings   BREAST CYST ASPIRATION Right 02/2018   CHOLECYSTECTOMY  2009   COLONOSCOPY  2014   GASTRIC BYPASS  2006, 2015   2015 revision   GASTRIC RESECTION  2015   SHOULDER ARTHROSCOPY WITH OPEN ROTATOR CUFF REPAIR Right 03/27/2018   Procedure: SHOULDER ARTHROSCOPY WITH OPEN ROTATOR CUFF REPAIR;  Surgeon: Christena Flake, MD;  Location: [facility];  Service: Orthopedics;  Laterality: Right;   SHOULDER CLOSED REDUCTION Right 07/08/2018   Procedure: CLOSED MANIPULATION SHOULDER;  Surgeon: Christena Flake, MD;  Location: [facility];  Service: Orthopedics;  Laterality: Right;   STERIOD INJECTION Right 07/08/2018   Procedure: STEROID INJECTION OF RIGHT SHOULDER;  Surgeon: Christena Flake, MD;  Location: [facility];  Service: Orthopedics;  Laterality: Right;   TONSILLECTOMY     TUBAL LIGATION     UPPER GI ENDOSCOPY      FAMILY HISTORY :   Family History  Problem Relation Age of Onset   Lung cancer Maternal Grandfather    Cancer Maternal Grandfather    Diabetes Mother    Hypertension Mother    Diabetes Father    Hypertension Father    Crohn's disease Son    Rheum arthritis Son    Asthma Son    Cancer Maternal Grandmother    Breast cancer Maternal Aunt        [phone]   Colon cancer Maternal Uncle     SOCIAL  HISTORY:   Social History   Tobacco Use   Smoking status: Never   Smokeless tobacco: Never  Vaping Use   Vaping Use: Never used  Substance Use Topics   Alcohol use: No   Drug use: No    ALLERGIES:  is allergic to nsaids.  MEDICATIONS:  Current Outpatient Medications  Medication Sig Dispense Refill   acetaminophen (TYLENOL) 500 MG tablet Take 1,000 mg by mouth every 6 (six) hours as needed for moderate pain or headache.      amitriptyline (ELAVIL) 50 MG tablet Take 50 mg by mouth at bedtime as needed for  sleep.     Calcium-Magnesium-Vitamin D (CALCIUM 500 PO) Take 500 mg by mouth 2 (two) times daily.     CYANOCOBALAMIN IJ Inject as directed.     Magnesium 400 MG TABS Take 400 mg by mouth daily.     ondansetron (ZOFRAN-ODT) 4 MG disintegrating tablet Take 1 tablet (4 mg total) by mouth every 8 (eight) hours as needed for nausea or vomiting. 30 tablet 1   Probiotic CAPS Take 1 capsule by mouth daily.     Semaglutide-Weight Management (WEGOVY) 2.4 MG/0.75ML SOAJ Inject 2.4 mg into the skin once a week. 9 mL 0   Vitamin D, Ergocalciferol, (DRISDOL) 1.25 MG (50000 UNIT) CAPS capsule TAKE 1 CAPSULE ONCE WEEKLY 12 capsule 12   No current facility-administered medications for this visit.   Facility-Administered Medications Ordered in Other Visits  Medication Dose Route Frequency Provider Last Rate Last Admin   cyanocobalamin (VITAMIN B12) injection 1,000 mcg  1,000 mcg Intramuscular Once Earna Coder, MD        PHYSICAL EXAMINATION: ECOG PERFORMANCE STATUS: 0 - Asymptomatic  BP 137/69 (BP Location: Left Arm, Patient Position: Sitting, Cuff Size: Normal)   Pulse 66   Temp 98.6 F (37 C) (Tympanic)   Ht 5\' 4"  (1.626 m)   Wt 152 lb (68.9 kg)   SpO2 100%   BMI 26.09 kg/m   Filed Weights   02/23/22 1333  Weight: 152 lb (68.9 kg)    Physical Exam HENT:     Head: Normocephalic and atraumatic.     Mouth/Throat:     Pharynx: No oropharyngeal exudate.  Eyes:     Pupils: Pupils are equal, round, and reactive to light.  Cardiovascular:     Rate and Rhythm: Normal rate and regular rhythm.  Pulmonary:     Effort: No respiratory distress.     Breath sounds: No wheezing.  Abdominal:     General: Bowel sounds are normal. There is no distension.     Palpations: Abdomen is soft. There is no mass.     Tenderness: There is no abdominal tenderness. There is no guarding or rebound.  Musculoskeletal:        General: No tenderness. Normal range of motion.     Cervical back: Normal range  of motion and neck supple.  Skin:    General: Skin is warm.  Neurological:     Mental Status: She is alert and oriented to person, place, and time.  Psychiatric:        Mood and Affect: Affect normal.     LABORATORY DATA:  I have reviewed the data as listed    Component Value Date/Time   NA 139 11/10/2021 1600   NA 142 03/23/2013 0428   K 4.1 11/10/2021 1600   K 4.1 06/29/2013 0902   CL 102 11/10/2021 1600   CL 112 (H) 03/23/2013 0428   CO2  22 11/10/2021 1600   CO2 24 03/23/2013 0428   GLUCOSE 83 11/10/2021 1600   GLUCOSE 104 (H) 06/20/2018 1509   GLUCOSE 90 03/23/2013 0428   BUN 12 11/10/2021 1600   BUN 5 (L) 03/23/2013 0428   CREATININE 0.74 11/10/2021 1600   CREATININE 0.75 06/12/2013 0842   CALCIUM 8.9 11/10/2021 1600   CALCIUM 7.7 (L) 03/23/2013 0428   PROT 7.2 11/10/2021 1600   PROT 6.1 (L) 03/20/2013 1843   ALBUMIN 4.2 11/10/2021 1600   ALBUMIN 3.0 (L) 03/20/2013 1843   AST 22 11/10/2021 1600   AST 25 03/20/2013 1843   ALT 23 11/10/2021 1600   ALT 49 03/20/2013 1843   ALKPHOS 91 11/10/2021 1600   ALKPHOS 84 03/20/2013 1843   BILITOT 0.3 11/10/2021 1600   BILITOT 0.1 (L) 03/20/2013 1843   GFRNONAA 76 11/20/2019 0859   GFRNONAA >60 06/12/2013 0842   GFRAA 87 11/20/2019 0859   GFRAA >60 06/12/2013 0842    No results found for: "SPEP", "UPEP"  Lab Results  Component Value Date   WBC 4.5 02/16/2022   NEUTROABS 2.7 02/16/2022   HGB 12.1 02/16/2022   HCT 36.2 02/16/2022   MCV 93.1 02/16/2022   PLT 237 02/16/2022      Chemistry      Component Value Date/Time   NA 139 11/10/2021 1600   NA 142 03/23/2013 0428   K 4.1 11/10/2021 1600   K 4.1 06/29/2013 0902   CL 102 11/10/2021 1600   CL 112 (H) 03/23/2013 0428   CO2 22 11/10/2021 1600   CO2 24 03/23/2013 0428   BUN 12 11/10/2021 1600   BUN 5 (L) 03/23/2013 0428   CREATININE 0.74 11/10/2021 1600   CREATININE 0.75 06/12/2013 0842   GLU 82 10/16/2013 0000      Component Value Date/Time   CALCIUM  8.9 11/10/2021 1600   CALCIUM 7.7 (L) 03/23/2013 0428   ALKPHOS 91 11/10/2021 1600   ALKPHOS 84 03/20/2013 1843   AST 22 11/10/2021 1600   AST 25 03/20/2013 1843   ALT 23 11/10/2021 1600   ALT 49 03/20/2013 1843   BILITOT 0.3 11/10/2021 1600   BILITOT 0.1 (L) 03/20/2013 1843        ASSESSMENT & PLAN:  Acquired iron deficiency anemia due to decreased absorption # 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  Symptomatic.  Proceed with IV ferrahem.   # B12 deficiency-Dec 2021- AUG 2023- B12 injection q 65M.  1 week- labs prior sec to insurance  # DISPOSITION: # B12 injection ONLY; ; HOLD off Ferrahem today # Follow up in 6 months-MD;  1 week prior-labs cbc; b12 levels/ferritin/iron studies; possible ferrahem/ b12 injection-Dr.B   Cc;Dr.[name].      Earna Coder, MD 02/23/2022 2:01 PM

## 2022-03-15 NOTE — Progress Notes (Unsigned)
Established patient visit  I,April Miller,acting as a scribe for Mila Merry, MD.,have documented all relevant documentation on the behalf of Mila Merry, MD,as directed by  Mila Merry, MD while in the presence of Mila Merry, MD.   Patient: Laura Murray   DOB: [birth year]   56 y.o. Female  MRN: [id number] Visit Date: 03/16/2022  Today's healthcare provider: Mila Merry, MD   Chief Complaint  Patient presents with   Follow-up   Weight Check   Subjective    HPI  Follow up for overweight:  The patient was last seen for this on 01/17/2022.   Changes made at last visit includes continuing Wegovy. Was prescribed Zofran to take on days she injects [facility] for nausea. Only has to take a single dose with each injection. Nausea resolves within a day.   She reports good compliance with treatment. She feels that condition is Improved. She is not having side effects. none  -----------------------------------------------------------------------------------------   Medications: Outpatient Medications Prior to Visit  Medication Sig   acetaminophen (TYLENOL) 500 MG tablet Take 1,000 mg by mouth every 6 (six) hours as needed for moderate pain or headache.    amitriptyline (ELAVIL) 50 MG tablet Take 50 mg by mouth at bedtime as needed for sleep.   Calcium-Magnesium-Vitamin D (CALCIUM 500 PO) Take 500 mg by mouth 2 (two) times daily.   CYANOCOBALAMIN IJ Inject as directed.   Magnesium 400 MG TABS Take 400 mg by mouth daily.   ondansetron (ZOFRAN-ODT) 4 MG disintegrating tablet Take 1 tablet (4 mg total) by mouth every 8 (eight) hours as needed for nausea or vomiting.   Probiotic CAPS Take 1 capsule by mouth daily.   Semaglutide-Weight Management (WEGOVY) 2.4 MG/0.75ML SOAJ Inject 2.4 mg into the skin once a week.   Vitamin D, Ergocalciferol, (DRISDOL) 1.25 MG (50000 UNIT) CAPS capsule TAKE 1 CAPSULE ONCE WEEKLY   No facility-administered medications prior to visit.     Review of Systems  Constitutional:  Negative for appetite change, chills, fatigue and fever.  Respiratory:  Negative for chest tightness and shortness of breath.   Cardiovascular:  Negative for chest pain and palpitations.  Gastrointestinal:  Negative for abdominal pain, nausea and vomiting.  Neurological:  Negative for dizziness and weakness.    {Labs  Heme  Chem  Endocrine  Serology  Results Review (optional):23779}   Objective    BP (!) 107/59 (BP Location: Left Arm, Patient Position: Sitting, Cuff Size: Normal)   Pulse 65   Resp 16   Ht 5\' 4"  (1.626 m)   Wt 148 lb (67.1 kg)   SpO2 100%   BMI 25.40 kg/m  {Show previous vital signs (optional):23777}  Physical Exam   General appearance: Well developed, well nourished female, cooperative and in no acute distress Head: Normocephalic, without obvious abnormality, atraumatic Respiratory: Respirations even and unlabored, normal respiratory rate Extremities: All extremities are intact.  Skin: Skin color, texture, turgor normal. No rashes seen  Psych: Appropriate mood and affect. Neurologic: Mental status: Alert, oriented to person, place, and time, thought content appropriate.    Assessment & Plan     1. Extreme obesity Doing very well with Wegovy with 15 pound (9.2%) weight loss since initiation of medication in March with no significant adverse effects. Ideal goal weight =140 lb (BMI=24)  2. Overweight refill - Semaglutide-Weight Management (WEGOVY) 2.4 MG/0.75ML SOAJ; Inject 2.4 mg into the skin once a week.  Dispense: 9 mL; Refill: 3   She declined flu  vaccine today since she is planning on getting it at work.      The entirety of the information documented in the History of Present Illness, Review of Systems and Physical Exam were personally obtained by me. Portions of this information were initially documented by the CMA and reviewed by me for thoroughness and accuracy.     Mila Merry, MD  [facility] [phone] (phone) [phone] (fax)  [facility]

## 2022-03-16 ENCOUNTER — Encounter: Payer: Self-pay | Admitting: Family Medicine

## 2022-03-16 ENCOUNTER — Ambulatory Visit: Payer: BC Managed Care – PPO | Admitting: Family Medicine

## 2022-03-16 VITALS — BP 107/59 | HR 65 | Resp 16 | Ht 64.0 in | Wt 148.0 lb

## 2022-03-16 DIAGNOSIS — E663 Overweight: Secondary | ICD-10-CM | POA: Diagnosis not present

## 2022-03-16 DIAGNOSIS — E668 Other obesity: Secondary | ICD-10-CM

## 2022-03-16 MED ORDER — WEGOVY 2.4 MG/0.75ML ~~LOC~~ SOAJ: 2.4000 mg | SUBCUTANEOUS | 3 refills | Status: DC

## 2022-03-16 NOTE — Patient Instructions (Signed)
.   Please review the attached list of medications and notify my office if there are any errors.   . Please bring all of your medications to every appointment so we can make sure that our medication list is the same as yours.   

## 2022-03-21 ENCOUNTER — Encounter: Payer: Self-pay | Admitting: Family Medicine

## 2022-06-07 ENCOUNTER — Other Ambulatory Visit: Payer: Self-pay | Admitting: Obstetrics and Gynecology

## 2022-06-07 DIAGNOSIS — Z1231 Encounter for screening mammogram for malignant neoplasm of breast: Secondary | ICD-10-CM

## 2022-06-11 ENCOUNTER — Encounter: Payer: Self-pay | Admitting: Family Medicine

## 2022-06-21 ENCOUNTER — Other Ambulatory Visit: Payer: Self-pay | Admitting: *Deleted

## 2022-06-21 DIAGNOSIS — E668 Other obesity: Secondary | ICD-10-CM

## 2022-06-21 DIAGNOSIS — E663 Overweight: Secondary | ICD-10-CM

## 2022-06-21 MED ORDER — WEGOVY 2.4 MG/0.75ML ~~LOC~~ SOAJ: 2.4000 mg | SUBCUTANEOUS | 3 refills | Status: DC

## 2022-06-26 NOTE — Telephone Encounter (Signed)
Can you please call [employer] about wegovey... there is no PA on CoverMyMeds and none have come through the fax.

## 2022-07-06 ENCOUNTER — Ambulatory Visit
Admission: RE | Admit: 2022-07-06 | Discharge: 2022-07-06 | Disposition: A | Discharge status: Home / Self Care | Payer: BC Managed Care – PPO | Source: Ambulatory Visit | Attending: Obstetrics and Gynecology | Admitting: Obstetrics and Gynecology

## 2022-07-06 DIAGNOSIS — Z1231 Encounter for screening mammogram for malignant neoplasm of breast: Secondary | ICD-10-CM | POA: Diagnosis present

## 2022-07-16 ENCOUNTER — Telehealth: Payer: Self-pay | Admitting: Family Medicine

## 2022-07-16 NOTE — Telephone Encounter (Signed)
Patient called in about PA on Wegovy. I asked her if they could refax the request, but still wanted to ge the message over

## 2022-07-17 NOTE — Telephone Encounter (Signed)
PA started today.  

## 2022-07-30 DIAGNOSIS — M5412 Radiculopathy, cervical region: Secondary | ICD-10-CM | POA: Insufficient documentation

## 2022-08-14 ENCOUNTER — Other Ambulatory Visit: Payer: Self-pay | Admitting: Family Medicine

## 2022-08-14 DIAGNOSIS — E663 Overweight: Secondary | ICD-10-CM

## 2022-08-14 DIAGNOSIS — E668 Other obesity: Secondary | ICD-10-CM

## 2022-08-14 MED ORDER — WEGOVY 2.4 MG/0.75ML ~~LOC~~ SOAJ: 2.4000 mg | SUBCUTANEOUS | 3 refills | Status: DC

## 2022-08-16 NOTE — Telephone Encounter (Signed)
I still haven't seen a PA and there is nothing in CoverMyMeds. Please contact her pharmacy about this.

## 2022-08-16 NOTE — Progress Notes (Unsigned)
   Argentina Ponder [place],acting as a scribe for Lelon Huh, MD.,have documented all relevant documentation on the behalf of Lelon Huh, MD,as directed by  Lelon Huh, MD while in the presence of Lelon Huh, MD.     Established patient visit   Patient: Laura Murray   DOB: 16-May-1966   57 y.o. Female  MRN: [id number] Visit Date: 08/17/2022  Today's healthcare provider: Lelon Huh, MD   No chief complaint on file.  Subjective    HPI  Obesity: Patient presents for follow up of obesity. She was last seen 5 months ago.She was at that time doing well on Wegovy which was started in March.  She has not been able to get the [facility] since her last visit.  She is almost at her goal of 140 lb.  Medications: Outpatient Medications Prior to Visit  Medication Sig   acetaminophen (TYLENOL) 500 MG tablet Take 1,000 mg by mouth every 6 (six) hours as needed for moderate pain or headache.    amitriptyline (ELAVIL) 50 MG tablet Take 50 mg by mouth at bedtime as needed for sleep.   Calcium-Magnesium-Vitamin D (CALCIUM 500 PO) Take 500 mg by mouth 2 (two) times daily.   CYANOCOBALAMIN IJ Inject as directed.   Magnesium 400 MG TABS Take 400 mg by mouth daily.   ondansetron (ZOFRAN-ODT) 4 MG disintegrating tablet Take 1 tablet (4 mg total) by mouth every 8 (eight) hours as needed for nausea or vomiting.   Probiotic CAPS Take 1 capsule by mouth daily.   Semaglutide-Weight Management (WEGOVY) 2.4 MG/0.75ML SOAJ Inject 2.4 mg into the skin once a week.   Vitamin D, Ergocalciferol, (DRISDOL) 1.25 MG (50000 UNIT) CAPS capsule TAKE 1 CAPSULE ONCE WEEKLY   No facility-administered medications prior to visit.    Review of Systems  Constitutional:  Negative for fatigue and fever.  Respiratory:  Negative for shortness of breath and wheezing.   Cardiovascular:  Negative for chest pain and palpitations.  Musculoskeletal:  Negative for myalgias.  Neurological:  Negative for dizziness and headaches.     {Labs  Heme  Chem  Endocrine  Serology  Results Review (optional):23779}   Objective    BP (!) 118/57 (BP Location: Left Arm, Patient Position: Sitting, Cuff Size: Normal)   Pulse 73   Temp 98 F (36.7 C) (Oral)   Wt 143 lb (64.9 kg)   SpO2 100%   BMI 24.55 kg/m  {Show previous vital signs (optional):23777}  Physical Exam  ***  No results found for any visits on 08/17/22.  Assessment & Plan     ***  No follow-ups on file.      {provider attestation***:1}   Lelon Huh, MD  [place] [phone] (phone) [phone] (fax)  [place]

## 2022-08-17 ENCOUNTER — Ambulatory Visit: Payer: BC Managed Care – PPO | Admitting: Family Medicine

## 2022-08-17 VITALS — BP 118/57 | HR 73 | Temp 98.0°F | Wt 143.0 lb

## 2022-08-17 DIAGNOSIS — E663 Overweight: Secondary | ICD-10-CM | POA: Diagnosis not present

## 2022-08-17 NOTE — Patient Instructions (Signed)
.   Please review the attached list of medications and notify my office if there are any errors.   . Please bring all of your medications to every appointment so we can make sure that our medication list is the same as yours.   

## 2022-08-21 ENCOUNTER — Other Ambulatory Visit: Payer: Self-pay

## 2022-08-21 DIAGNOSIS — E663 Overweight: Secondary | ICD-10-CM

## 2022-08-21 DIAGNOSIS — E668 Other obesity: Secondary | ICD-10-CM

## 2022-08-21 MED ORDER — WEGOVY 2.4 MG/0.75ML ~~LOC~~ SOAJ: 2.4000 mg | SUBCUTANEOUS | 3 refills | Status: DC

## 2022-08-24 ENCOUNTER — Inpatient Hospital Stay: Payer: BC Managed Care – PPO | Attending: Internal Medicine

## 2022-08-27 ENCOUNTER — Telehealth: Payer: Self-pay | Admitting: Family Medicine

## 2022-08-27 NOTE — Telephone Encounter (Signed)
Received a fax from covermymeds stating that a pa worked on by our office requires additional action to complete  Key: [id number]

## 2022-08-31 ENCOUNTER — Ambulatory Visit: Payer: BC Managed Care – PPO

## 2022-08-31 ENCOUNTER — Inpatient Hospital Stay: Payer: BC Managed Care – PPO | Admitting: Internal Medicine

## 2022-08-31 ENCOUNTER — Ambulatory Visit: Payer: BC Managed Care – PPO | Admitting: Internal Medicine

## 2022-08-31 ENCOUNTER — Inpatient Hospital Stay: Payer: BC Managed Care – PPO

## 2022-09-03 ENCOUNTER — Telehealth: Payer: Self-pay | Admitting: Family Medicine

## 2022-09-03 NOTE — Telephone Encounter (Signed)
Recieved a fax from ([facility])covermymeds for [facility] 2.'4MG'$ /0.75ML Auto-injectors( Has started)   key: [id number]

## 2022-09-14 ENCOUNTER — Inpatient Hospital Stay: Payer: BC Managed Care – PPO | Attending: Internal Medicine

## 2022-09-14 DIAGNOSIS — E538 Deficiency of other specified B group vitamins: Secondary | ICD-10-CM | POA: Diagnosis not present

## 2022-09-14 DIAGNOSIS — D508 Other iron deficiency anemias: Secondary | ICD-10-CM

## 2022-09-14 LAB — CBC WITH DIFFERENTIAL/PLATELET
Abs Immature Granulocytes: 0.01 10*3/uL (ref 0.00–0.07)
Basophils Absolute: 0 10*3/uL (ref 0.0–0.1)
Basophils Relative: 1 %
Eosinophils Absolute: 0.1 10*3/uL (ref 0.0–0.5)
Eosinophils Relative: 3 %
HCT: 39.3 % (ref 36.0–46.0)
Hemoglobin: 12.9 g/dL (ref 12.0–15.0)
Immature Granulocytes: 0 %
Lymphocytes Relative: 26 %
Lymphs Abs: 0.9 10*3/uL (ref 0.7–4.0)
MCH: 30.6 pg (ref 26.0–34.0)
MCHC: 32.8 g/dL (ref 30.0–36.0)
MCV: 93.1 fL (ref 80.0–100.0)
Monocytes Absolute: 0.3 10*3/uL (ref 0.1–1.0)
Monocytes Relative: 8 %
Neutro Abs: 2 10*3/uL (ref 1.7–7.7)
Neutrophils Relative %: 62 %
Platelets: 252 10*3/uL (ref 150–400)
RBC: 4.22 MIL/uL (ref 3.87–5.11)
RDW: 12 % (ref 11.5–15.5)
WBC: 3.3 10*3/uL — ABNORMAL LOW (ref 4.0–10.5)
nRBC: 0 % (ref 0.0–0.2)

## 2022-09-14 LAB — IRON AND TIBC
Iron: 90 ug/dL (ref 28–170)
Saturation Ratios: 21 % (ref 10.4–31.8)
TIBC: 423 ug/dL (ref 250–450)
UIBC: 333 ug/dL

## 2022-09-14 LAB — FERRITIN: Ferritin: 23 ng/mL (ref 11–307)

## 2022-09-14 LAB — VITAMIN B12: Vitamin B-12: 188 pg/mL (ref 180–914)

## 2022-09-18 ENCOUNTER — Inpatient Hospital Stay: Payer: BC Managed Care – PPO

## 2022-09-18 ENCOUNTER — Inpatient Hospital Stay (HOSPITAL_BASED_OUTPATIENT_CLINIC_OR_DEPARTMENT_OTHER): Payer: BC Managed Care – PPO | Admitting: Internal Medicine

## 2022-09-18 VITALS — BP 144/68 | HR 73 | Temp 97.3°F | Resp 18 | Wt 145.0 lb

## 2022-09-18 DIAGNOSIS — D500 Iron deficiency anemia secondary to blood loss (chronic): Secondary | ICD-10-CM

## 2022-09-18 DIAGNOSIS — E538 Deficiency of other specified B group vitamins: Secondary | ICD-10-CM | POA: Diagnosis not present

## 2022-09-18 DIAGNOSIS — D508 Other iron deficiency anemias: Secondary | ICD-10-CM

## 2022-09-18 MED ORDER — CYANOCOBALAMIN 1000 MCG/ML IJ SOLN
1000.0000 ug | Freq: Once | INTRAMUSCULAR | Status: AC
  Administered 2022-09-18: 1000 ug via INTRAMUSCULAR
  Filled 2022-09-18: qty 1

## 2022-09-18 MED FILL — Ferumoxytol Inj 510 MG/17ML (30 MG/ML) (Elemental Fe): INTRAVENOUS | Qty: 17 | Status: AC

## 2022-09-18 NOTE — Progress Notes (Signed)
[facility] OFFICE PROGRESS NOTE  Patient Care Team: Birdie Sons, MD as PCP - General (Family Medicine) Dallas Schimke, MD as Referring Physician (Internal Medicine) Bary Castilla Forest Gleason, MD as Consulting Physician (General Surgery) Schermerhorn, Gwen Her, MD as Referring Physician (Obstetrics and Gynecology)   SUMMARY OF ONCOLOGIC HISTORY:  # IDA Laura Murray to gastric Bypass 2002/ menstrual blood loss]; IV iron q 6 M/ B12 IM [thru PCP]; B12 def-   # 2019- ? raynauds' s/p rheuma eval.   INTERVAL HISTORY: Patient ambulating-independently. Alone.  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  Pt doing well. No dizziness. No visible blood in stool.   Patient continues to be on semaglutide for weight loss.  Seems to be helping.  Patient denies any blood in stools or black or stools.  Denies any worsening fatigue.  Review of Systems  Constitutional:  Positive for malaise/fatigue. Negative for chills, diaphoresis, fever and weight loss.  HENT:  Negative for nosebleeds and sore throat.   Eyes:  Negative for double vision.  Respiratory:  Negative for cough, hemoptysis, sputum production, shortness of breath and wheezing.   Cardiovascular:  Negative for chest pain, palpitations, orthopnea and leg swelling.  Gastrointestinal:  Negative for abdominal pain, blood in stool, constipation, diarrhea, heartburn, melena, nausea and vomiting.  Genitourinary:  Negative for dysuria, frequency and urgency.  Musculoskeletal:  Negative for back pain and joint pain.  Skin: Negative.  Negative for itching and rash.  Neurological:  Negative for dizziness, tingling, focal weakness, weakness and headaches.  Endo/Heme/Allergies:  Does not bruise/bleed easily.  Psychiatric/Behavioral:  Negative for depression. The patient has insomnia. The patient is not nervous/anxious.    Marland Kitchen   PAST MEDICAL  HISTORY :  Past Medical History:  Diagnosis Date   Anemia    Insomnia    Obesity    Ovarian cyst    Upper GI bleeding 2015   gastric ulcer    PAST SURGICAL HISTORY :   Past Surgical History:  Procedure Laterality Date   BREAST CYST ASPIRATION Left 02/02/2014   Dr Bary Castilla   BREAST CYST ASPIRATION Right 02/2018   CHOLECYSTECTOMY  2009   COLONOSCOPY  2014   GASTRIC BYPASS  2006, 2015   2015 revision   GASTRIC RESECTION  2015   SHOULDER ARTHROSCOPY WITH OPEN ROTATOR CUFF REPAIR Right 03/27/2018   Procedure: SHOULDER ARTHROSCOPY WITH OPEN ROTATOR CUFF REPAIR;  Surgeon: Corky Mull, MD;  Location: [facility];  Service: Orthopedics;  Laterality: Right;   SHOULDER CLOSED REDUCTION Right 07/08/2018   Procedure: CLOSED MANIPULATION SHOULDER;  Surgeon: Corky Mull, MD;  Location: [facility];  Service: Orthopedics;  Laterality: Right;   STERIOD INJECTION Right 07/08/2018   Procedure: STEROID INJECTION OF RIGHT SHOULDER;  Surgeon: Corky Mull, MD;  Location: [facility];  Service: Orthopedics;  Laterality: Right;   TONSILLECTOMY     TUBAL LIGATION     UPPER GI ENDOSCOPY      FAMILY HISTORY :   Family History  Problem Relation Age of Onset   Lung cancer Maternal Grandfather    Cancer Maternal Grandfather    Diabetes Mother    Hypertension Mother    Diabetes Father    Hypertension Father    Crohn's disease Son    Rheum arthritis Son    Asthma Son    Cancer Maternal Grandmother    Breast cancer Maternal Aunt  [id number]   Colon cancer Maternal Uncle     SOCIAL HISTORY:   Social History   Tobacco Use   Smoking status: Never   Smokeless tobacco: Never  Vaping Use   Vaping Use: Never used  Substance Use Topics   Alcohol use: No   Drug use: No    ALLERGIES:  is allergic to nsaids.  MEDICATIONS:  Current Outpatient Medications  Medication Sig Dispense Refill   acetaminophen (TYLENOL) 500 MG tablet Take 1,000 mg by mouth every 6 (six) hours as needed for moderate  pain or headache.      amitriptyline (ELAVIL) 50 MG tablet Take 50 mg by mouth at bedtime as needed for sleep.     Calcium-Magnesium-Vitamin D (CALCIUM 500 PO) Take 500 mg by mouth 2 (two) times daily.     CYANOCOBALAMIN IJ Inject as directed.     Magnesium 400 MG TABS Take 400 mg by mouth daily.     ondansetron (ZOFRAN-ODT) 4 MG disintegrating tablet Take 1 tablet (4 mg total) by mouth every 8 (eight) hours as needed for nausea or vomiting. 30 tablet 1   Probiotic CAPS Take 1 capsule by mouth daily.     Semaglutide-Weight Management (WEGOVY) 2.4 MG/0.75ML SOAJ Inject 2.4 mg into the skin once a week. 9 mL 3   Vitamin D, Ergocalciferol, (DRISDOL) 1.25 MG (50000 UNIT) CAPS capsule TAKE 1 CAPSULE ONCE WEEKLY 12 capsule 12   No current facility-administered medications for this visit.    PHYSICAL EXAMINATION: ECOG PERFORMANCE STATUS: 0 - Asymptomatic  BP (!) 144/68 (BP Location: Left Arm, Patient Position: Sitting)   Pulse 73   Temp (!) 97.3 F (36.3 C) (Tympanic)   Resp 18   Wt 145 lb (65.8 kg)   SpO2 100%   BMI 24.89 kg/m   Filed Weights   09/18/22 1455  Weight: 145 lb (65.8 kg)    Physical Exam HENT:     Head: Normocephalic and atraumatic.     Mouth/Throat:     Pharynx: No oropharyngeal exudate.  Eyes:     Pupils: Pupils are equal, round, and reactive to light.  Cardiovascular:     Rate and Rhythm: Normal rate and regular rhythm.  Pulmonary:     Effort: No respiratory distress.     Breath sounds: No wheezing.  Abdominal:     General: Bowel sounds are normal. There is no distension.     Palpations: Abdomen is soft. There is no mass.     Tenderness: There is no abdominal tenderness. There is no guarding or rebound.  Musculoskeletal:        General: No tenderness. Normal range of motion.     Cervical back: Normal range of motion and neck supple.  Skin:    General: Skin is warm.  Neurological:     Mental Status: She is alert and oriented to person, place, and time.   Psychiatric:        Mood and Affect: Affect normal.     LABORATORY DATA:  I have reviewed the data as listed    Component Value Date/Time   NA 139 11/10/2021 1600   NA 142 03/23/2013 0428   K 4.1 11/10/2021 1600   K 4.1 06/29/2013 0902   CL 102 11/10/2021 1600   CL 112 (H) 03/23/2013 0428   CO2 22 11/10/2021 1600   CO2 24 03/23/2013 0428   GLUCOSE 83 11/10/2021 1600   GLUCOSE 104 (H) 06/20/2018 1509   GLUCOSE 90 03/23/2013 0428   BUN  12 11/10/2021 1600   BUN 5 (L) 03/23/2013 0428   CREATININE 0.74 11/10/2021 1600   CREATININE 0.75 06/12/2013 0842   CALCIUM 8.9 11/10/2021 1600   CALCIUM 7.7 (L) 03/23/2013 0428   PROT 7.2 11/10/2021 1600   PROT 6.1 (L) 03/20/2013 1843   ALBUMIN 4.2 11/10/2021 1600   ALBUMIN 3.0 (L) 03/20/2013 1843   AST 22 11/10/2021 1600   AST 25 03/20/2013 1843   ALT 23 11/10/2021 1600   ALT 49 03/20/2013 1843   ALKPHOS 91 11/10/2021 1600   ALKPHOS 84 03/20/2013 1843   BILITOT 0.3 11/10/2021 1600   BILITOT 0.1 (L) 03/20/2013 1843   GFRNONAA 76 11/20/2019 0859   GFRNONAA >60 06/12/2013 0842   GFRAA 87 11/20/2019 0859   GFRAA >60 06/12/2013 0842    No results found for: "SPEP", "UPEP"  Lab Results  Component Value Date   WBC 3.3 (L) 09/14/2022   NEUTROABS 2.0 09/14/2022   HGB 12.9 09/14/2022   HCT 39.3 09/14/2022   MCV 93.1 09/14/2022   PLT 252 09/14/2022      Chemistry      Component Value Date/Time   NA 139 11/10/2021 1600   NA 142 03/23/2013 0428   K 4.1 11/10/2021 1600   K 4.1 06/29/2013 0902   CL 102 11/10/2021 1600   CL 112 (H) 03/23/2013 0428   CO2 22 11/10/2021 1600   CO2 24 03/23/2013 0428   BUN 12 11/10/2021 1600   BUN 5 (L) 03/23/2013 0428   CREATININE 0.74 11/10/2021 1600   CREATININE 0.75 06/12/2013 0842   GLU 82 10/16/2013 0000      Component Value Date/Time   CALCIUM 8.9 11/10/2021 1600   CALCIUM 7.7 (L) 03/23/2013 0428   ALKPHOS 91 11/10/2021 1600   ALKPHOS 84 03/20/2013 1843   AST 22 11/10/2021 1600   AST  25 03/20/2013 1843   ALT 23 11/10/2021 1600   ALT 49 03/20/2013 1843   BILITOT 0.3 11/10/2021 1600   BILITOT 0.1 (L) 03/20/2013 1843        ASSESSMENT & PLAN:  Acquired iron deficiency anemia due to decreased absorption # 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  Symptomatic.  Recommend HOLDING  IV ferrahem.  Discussed regarding barium melt with iron multivitamin.   # B12 deficiency-Dec 2021- AUG 2023- B12 injection q 27M.  1 week- labs prior sec to insurance  # DISPOSITION: # B12 injection ONLY;  HOLD off Ferrahem today # Follow up in 6 months-MD;  1 week prior-labs cbc; bmp; vit D 25-OH levels; b12 levels/ferritin/iron studies; possible ferrahem/ b12 injection-Dr.B  Cc;Dr.[name].      Cammie Sickle, MD 09/18/2022 4:13 PM

## 2022-09-18 NOTE — Progress Notes (Signed)
Pt doing well. No dizziness. No visible blood in stool.

## 2022-09-18 NOTE — Assessment & Plan Note (Addendum)
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  Symptomatic.  Recommend HOLDING  IV ferrahem.  Discussed regarding barium melt with iron multivitamin.   # B12 deficiency-Dec 2021- AUG 2023- B12 injection q 76M.  1 week- labs prior sec to insurance  # DISPOSITION: # B12 injection ONLY;  HOLD off Ferrahem today # Follow up in 6 months-MD;  1 week prior-labs cbc; bmp; vit D 25-OH levels; b12 levels/ferritin/iron studies; possible ferrahem/ b12 injection-Dr.B  Cc;Dr.[name].

## 2022-09-21 ENCOUNTER — Telehealth (INDEPENDENT_AMBULATORY_CARE_PROVIDER_SITE_OTHER): Payer: Self-pay | Admitting: Nurse Practitioner

## 2022-09-21 NOTE — Telephone Encounter (Signed)
ERROR

## 2022-11-16 ENCOUNTER — Encounter: Payer: Self-pay | Admitting: Family Medicine

## 2022-11-16 DIAGNOSIS — E663 Overweight: Secondary | ICD-10-CM

## 2022-11-19 ENCOUNTER — Other Ambulatory Visit: Payer: Self-pay | Admitting: Family Medicine

## 2022-11-19 DIAGNOSIS — E668 Other obesity: Secondary | ICD-10-CM

## 2022-11-19 DIAGNOSIS — E663 Overweight: Secondary | ICD-10-CM

## 2022-12-05 NOTE — Telephone Encounter (Signed)
I have no idea how to proceed with this. CoverMyMeds does not manage her [place] can you call her insurance company, maybe they have PA forms they can fax to [place].

## 2022-12-06 ENCOUNTER — Telehealth: Payer: Self-pay | Admitting: Family Medicine

## 2022-12-06 DIAGNOSIS — E663 Overweight: Secondary | ICD-10-CM

## 2022-12-06 DIAGNOSIS — E668 Other obesity: Secondary | ICD-10-CM

## 2022-12-06 NOTE — Telephone Encounter (Signed)
PA required for  Requested Prescriptions   Pending Prescriptions Disp Refills   Semaglutide-Weight Management (WEGOVY) 2.4 MG/0.75ML SOAJ 9 mL 3    Sig: Inject 2.4 mg into the skin once a week.

## 2022-12-06 NOTE — Telephone Encounter (Signed)
Betsy with [employer] called saying the patient needs a PA for her Reginal Lutes and she is wanting to know if Dr. Sherrie Mustache will send one in.  [facility]. She does not have a fax number for them  CB#  [phone]

## 2022-12-07 NOTE — Telephone Encounter (Signed)
[facility] faxed to [facility] today.

## 2022-12-14 ENCOUNTER — Telehealth: Payer: Self-pay

## 2022-12-14 NOTE — Telephone Encounter (Signed)
Copied from CRM [id number]. Topic: General - Other >> Dec 14, 2022 10:04 AM Macon Large wrote: Reason for CRM: Shanda Bumps with [facility] requests prior authorization for Semaglutide-Weight Management (WEGOVY) 2.4 MG/0.75ML SOAJ . Cb# [phone] Fax# [phone] If there is a problem please contact Pharmacy Affairs at [phone] and request to expedite

## 2022-12-14 NOTE — Telephone Encounter (Signed)
Re-faxed PA forms and office notes to [phone].

## 2022-12-19 ENCOUNTER — Telehealth: Payer: Self-pay

## 2022-12-19 NOTE — Telephone Encounter (Signed)
Copied from CRM [id number]. Topic: General - Other >> Dec 19, 2022  9:02 AM Alfred Levins wrote: Chyrl Civatte w/ [employer] called (declined to leave her #, said call patient) she stated  patient told her that pharmacist stated she should not increase her medicine because she will get sick if it is increased to 2.4   (Semaglutide-Weight Management (WEGOVY) 2.4 MG/0.75ML SOAJ)....  Pharmacy recommend staying with 0.5 ...  [employer] person Chyrl Civatte stated patient will need a new authorization for 0.5 and you should also put the word "[facility]" on it

## 2022-12-21 MED ORDER — WEGOVY 0.25 MG/0.5ML ~~LOC~~ SOAJ: SUBCUTANEOUS | 0 refills | Status: DC

## 2022-12-21 NOTE — Telephone Encounter (Signed)
Pt stated PA needs to be initiated for medication Wegovy. Pt stated dose should not be increased.   Please advise.

## 2022-12-21 NOTE — Addendum Note (Signed)
Addended by: Malva Limes on: 12/21/2022 12:28 PM   Modules accepted: Orders

## 2023-01-11 ENCOUNTER — Other Ambulatory Visit: Payer: Self-pay | Admitting: Family Medicine

## 2023-01-11 DIAGNOSIS — E663 Overweight: Secondary | ICD-10-CM

## 2023-01-17 ENCOUNTER — Encounter: Payer: Self-pay | Admitting: Family Medicine

## 2023-01-17 NOTE — Telephone Encounter (Signed)
Copied from CRM [id number]. Topic: General - Other >> Jan 17, 2023 11:29 AM Franchot Heidelberg wrote: Reason for CRM: Pt says she is ready for her next dose of wegovy, she wants her next dose of 0.5 [facility] (they will not take a Rx that is not written for 90 day supply)   She says she is due for her next dose on Tuesday 01/22/2023. Her current pharmacy does not have the Rx in stock.   Please advise, pt wants a call back to discuss options

## 2023-01-18 ENCOUNTER — Other Ambulatory Visit: Payer: Self-pay | Admitting: Family Medicine

## 2023-01-18 MED ORDER — WEGOVY 0.5 MG/0.5ML ~~LOC~~ SOAJ: 0.5000 mg | SUBCUTANEOUS | 0 refills | Status: AC

## 2023-01-18 MED ORDER — WEGOVY 1.7 MG/0.75ML ~~LOC~~ SOAJ: 1.7000 mg | SUBCUTANEOUS | 0 refills | Status: DC

## 2023-01-18 MED ORDER — WEGOVY 1 MG/0.5ML ~~LOC~~ SOAJ: 1.0000 mg | SUBCUTANEOUS | 0 refills | Status: DC

## 2023-01-18 NOTE — Addendum Note (Signed)
Addended by: Malva Limes on: 01/18/2023 08:06 AM   Modules accepted: Orders

## 2023-01-25 ENCOUNTER — Other Ambulatory Visit: Payer: Self-pay | Admitting: Family Medicine

## 2023-01-25 DIAGNOSIS — E559 Vitamin D deficiency, unspecified: Secondary | ICD-10-CM

## 2023-01-25 NOTE — Telephone Encounter (Signed)
Requested medication (s) are due for refill today: Yes  Requested medication (s) are on the active medication list: Yes  Last refill:  12/01/21 #12 12RF  Future visit scheduled: No  Notes to clinic:  Manual review required      Requested Prescriptions  Pending Prescriptions Disp Refills   Vitamin D, Ergocalciferol, (DRISDOL) 1.25 MG (50000 UNIT) CAPS capsule [Pharmacy Med Name: VIT D-2 CAP(ERGOCAL)1.25MG  50,000U] 12 capsule 3    Sig: TAKE 1 CAPSULE ONCE WEEKLY     Endocrinology:  Vitamins - Vitamin D Supplementation 2 Failed - 01/25/2023 12:13 AM      Failed - Manual Review: Route requests for 50,000 IU strength to the provider      Failed - Ca in normal range and within 360 days    Calcium  Date Value Ref Range Status  11/10/2021 8.9 8.7 - 10.2 mg/dL Final   Calcium, Total  Date Value Ref Range Status  03/23/2013 7.7 (L) 8.5 - 10.1 mg/dL Final         Failed - Vitamin D in normal range and within 360 days    Vit D, 25-Hydroxy  Date Value Ref Range Status  11/10/2021 71.6 30.0 - 100.0 ng/mL Final    Comment:    Vitamin D deficiency has been defined by the Institute of Medicine and an Endocrine Society practice guideline as a level of serum 25-OH vitamin D less than 20 ng/mL (1,2). The Endocrine Society went on to further define vitamin D insufficiency as a level between 21 and 29 ng/mL (2). 1. IOM (Institute of Medicine). 2010. Dietary reference    intakes for calcium and D. [place]: The    [employer]. 2. Holick MF, Binkley [place], Bischoff-Ferrari HA, et al.    Evaluation, treatment, and prevention of vitamin D    deficiency: an Endocrine Society clinical practice    guideline. JCEM. 2011 Jul; 96(7):1911-30.          Passed - Valid encounter within last 12 months    Recent Outpatient Visits           5 months ago Overweight   [facility] Malva Limes, MD   10 months ago Extreme obesity   [facility] Malva Limes, MD   1 year ago Overweight (BMI 25.0-29.9)    [facility] Malva Limes, MD   1 year ago Vitamin D deficiency   [facility] Malva Limes, MD   1 year ago Encounter for weight management   [facility] Malva Limes, MD

## 2023-01-28 MED ORDER — WEGOVY 0.25 MG/0.5ML ~~LOC~~ SOAJ: 0.2500 mg | SUBCUTANEOUS | 0 refills | Status: DC

## 2023-02-19 MED ORDER — WEGOVY 1.7 MG/0.75ML ~~LOC~~ SOAJ: 1.7000 mg | SUBCUTANEOUS | 0 refills | Status: DC

## 2023-02-19 NOTE — Addendum Note (Signed)
Addended by: Malva Limes on: 02/19/2023 12:04 PM   Modules accepted: Orders

## 2023-03-14 ENCOUNTER — Inpatient Hospital Stay: Payer: BC Managed Care – PPO | Attending: Internal Medicine

## 2023-03-14 ENCOUNTER — Other Ambulatory Visit: Payer: BC Managed Care – PPO

## 2023-03-14 DIAGNOSIS — D500 Iron deficiency anemia secondary to blood loss (chronic): Secondary | ICD-10-CM | POA: Insufficient documentation

## 2023-03-14 DIAGNOSIS — E538 Deficiency of other specified B group vitamins: Secondary | ICD-10-CM | POA: Diagnosis present

## 2023-03-14 DIAGNOSIS — Z803 Family history of malignant neoplasm of breast: Secondary | ICD-10-CM | POA: Diagnosis not present

## 2023-03-14 DIAGNOSIS — N920 Excessive and frequent menstruation with regular cycle: Secondary | ICD-10-CM | POA: Diagnosis present

## 2023-03-14 DIAGNOSIS — Z9884 Bariatric surgery status: Secondary | ICD-10-CM | POA: Diagnosis not present

## 2023-03-14 DIAGNOSIS — Z801 Family history of malignant neoplasm of trachea, bronchus and lung: Secondary | ICD-10-CM | POA: Diagnosis not present

## 2023-03-14 DIAGNOSIS — R5383 Other fatigue: Secondary | ICD-10-CM | POA: Diagnosis not present

## 2023-03-14 DIAGNOSIS — G479 Sleep disorder, unspecified: Secondary | ICD-10-CM | POA: Insufficient documentation

## 2023-03-14 DIAGNOSIS — Z886 Allergy status to analgesic agent status: Secondary | ICD-10-CM | POA: Diagnosis not present

## 2023-03-14 LAB — BASIC METABOLIC PANEL - CANCER CENTER ONLY
Anion gap: 8 (ref 5–15)
BUN: 10 mg/dL (ref 6–20)
CO2: 25 mmol/L (ref 22–32)
Calcium: 8.9 mg/dL (ref 8.9–10.3)
Chloride: 104 mmol/L (ref 98–111)
Creatinine: 0.89 mg/dL (ref 0.44–1.00)
GFR, Estimated: >60 mL/min (ref >60)
Glucose, Bld: 118 mg/dL — ABNORMAL HIGH (ref 70–99)
Potassium: 3.5 mmol/L (ref 3.5–5.1)
Sodium: 137 mmol/L (ref 135–145)

## 2023-03-14 LAB — CBC WITH DIFFERENTIAL (CANCER CENTER ONLY)
Abs Immature Granulocytes: 0.01 10*3/uL (ref 0.00–0.07)
Basophils Absolute: 0 10*3/uL (ref 0.0–0.1)
Basophils Relative: 1 %
Eosinophils Absolute: 0.1 10*3/uL (ref 0.0–0.5)
Eosinophils Relative: 4 %
HCT: 38.9 % (ref 36.0–46.0)
Hemoglobin: 11.9 g/dL — ABNORMAL LOW (ref 12.0–15.0)
Immature Granulocytes: 0 %
Lymphocytes Relative: 30 %
Lymphs Abs: 1 10*3/uL (ref 0.7–4.0)
MCH: 26 pg (ref 26.0–34.0)
MCHC: 30.6 g/dL (ref 30.0–36.0)
MCV: 85.1 fL (ref 80.0–100.0)
Monocytes Absolute: 0.1 10*3/uL (ref 0.1–1.0)
Monocytes Relative: 4 %
Neutro Abs: 2.1 10*3/uL (ref 1.7–7.7)
Neutrophils Relative %: 61 %
Platelet Count: 288 10*3/uL (ref 150–400)
RBC: 4.57 MIL/uL (ref 3.87–5.11)
RDW: 14.6 % (ref 11.5–15.5)
WBC Count: 3.4 10*3/uL — ABNORMAL LOW (ref 4.0–10.5)
nRBC: 0 % (ref 0.0–0.2)

## 2023-03-14 LAB — FERRITIN: Ferritin: 6 ng/mL — ABNORMAL LOW (ref 11–307)

## 2023-03-14 LAB — IRON AND TIBC
Iron: 64 ug/dL (ref 28–170)
Saturation Ratios: 11 % (ref 10.4–31.8)
TIBC: 582 ug/dL — ABNORMAL HIGH (ref 250–450)
UIBC: 518 ug/dL

## 2023-03-14 LAB — VITAMIN D 25 HYDROXY (VIT D DEFICIENCY, FRACTURES): Vit D, 25-Hydroxy: 69.17 ng/mL (ref 30–100)

## 2023-03-14 LAB — VITAMIN B12: Vitamin B-12: 261 pg/mL (ref 180–914)

## 2023-03-15 ENCOUNTER — Inpatient Hospital Stay: Payer: BC Managed Care – PPO

## 2023-03-20 ENCOUNTER — Ambulatory Visit: Payer: BC Managed Care – PPO

## 2023-03-20 ENCOUNTER — Inpatient Hospital Stay: Payer: BC Managed Care – PPO

## 2023-03-20 ENCOUNTER — Ambulatory Visit: Payer: BC Managed Care – PPO | Admitting: Internal Medicine

## 2023-03-20 ENCOUNTER — Inpatient Hospital Stay (HOSPITAL_BASED_OUTPATIENT_CLINIC_OR_DEPARTMENT_OTHER): Payer: BC Managed Care – PPO | Admitting: Internal Medicine

## 2023-03-20 ENCOUNTER — Encounter: Payer: Self-pay | Admitting: Internal Medicine

## 2023-03-20 VITALS — BP 139/57 | HR 66

## 2023-03-20 VITALS — BP 145/66 | HR 77 | Temp 98.1°F | Ht 64.0 in | Wt 159.8 lb

## 2023-03-20 DIAGNOSIS — D508 Other iron deficiency anemias: Secondary | ICD-10-CM | POA: Diagnosis not present

## 2023-03-20 DIAGNOSIS — D500 Iron deficiency anemia secondary to blood loss (chronic): Secondary | ICD-10-CM

## 2023-03-20 MED ORDER — CYANOCOBALAMIN 1000 MCG/ML IJ SOLN
1000.0000 ug | Freq: Once | INTRAMUSCULAR | Status: AC
  Administered 2023-03-20: 1000 ug via INTRAMUSCULAR
  Filled 2023-03-20: qty 1

## 2023-03-20 MED ORDER — SODIUM CHLORIDE 0.9 % IV SOLN
Freq: Once | INTRAVENOUS | Status: AC
  Filled 2023-03-20: qty 250

## 2023-03-20 MED ORDER — SODIUM CHLORIDE 0.9 % IV SOLN
510.0000 mg | Freq: Once | INTRAVENOUS | Status: AC
  Administered 2023-03-20: 510 mg via INTRAVENOUS
  Filled 2023-03-20: qty 17

## 2023-03-20 MED ORDER — SODIUM CHLORIDE 0.9% FLUSH
10.0000 mL | Freq: Once | INTRAVENOUS | Status: AC | PRN
  Administered 2023-03-20: 10 mL
  Filled 2023-03-20: qty 10

## 2023-03-20 NOTE — Progress Notes (Signed)
Patient tolerated Venofer infusion well. Explained recommendation of 30 min post monitoring. Patient refused to wait post monitoring. Educated on what signs to watch for & to call with any concerns. No questions, discharged. Stable  

## 2023-03-20 NOTE — Patient Instructions (Signed)
Iron Sucrose Injection What is this medication? IRON SUCROSE (EYE ern SOO krose) treats low levels of iron (iron deficiency anemia) in people with kidney disease. Iron is a mineral that plays an important role in making red blood cells, which carry oxygen from your lungs to the rest of your body. This medicine may be used for other purposes; ask your health care provider or pharmacist if you have questions. COMMON BRAND NAME(S): Venofer What should I tell my care team before I take this medication? They need to know if you have any of these conditions: Anemia not caused by low iron levels Heart disease High levels of iron in the blood Kidney disease Liver disease An unusual or allergic reaction to iron, other medications, foods, dyes, or preservatives Pregnant or trying to get pregnant Breastfeeding How should I use this medication? This medication is for infusion into a vein. It is given in a hospital or clinic setting. Talk to your care team about the use of this medication in children. While this medication may be prescribed for children as young as 2 years for selected conditions, precautions do apply. Overdosage: If you think you have taken too much of this medicine contact a poison control center or emergency room at once. NOTE: This medicine is only for you. Do not share this medicine with others. What if I miss a dose? Keep appointments for follow-up doses. It is important not to miss your dose. Call your care team if you are unable to keep an appointment. What may interact with this medication? Do not take this medication with any of the following: Deferoxamine Dimercaprol Other iron products This medication may also interact with the following: Chloramphenicol Deferasirox This list may not describe all possible interactions. Give your health care provider a list of all the medicines, herbs, non-prescription drugs, or dietary supplements you use. Also tell them if you smoke,  drink alcohol, or use illegal drugs. Some items may interact with your medicine. What should I watch for while using this medication? Visit your care team regularly. Tell your care team if your symptoms do not start to get better or if they get worse. You may need blood work done while you are taking this medication. You may need to follow a special diet. Talk to your care team. Foods that contain iron include: whole grains/cereals, dried fruits, beans, or peas, leafy green vegetables, and organ meats (liver, kidney). What side effects may I notice from receiving this medication? Side effects that you should report to your care team as soon as possible: Allergic reactions--skin rash, itching, hives, swelling of the face, lips, tongue, or throat Low blood pressure--dizziness, feeling faint or lightheaded, blurry vision Shortness of breath Side effects that usually do not require medical attention (report to your care team if they continue or are bothersome): Flushing Headache Joint pain Muscle pain Nausea Pain, redness, or irritation at injection site This list may not describe all possible side effects. Call your doctor for medical advice about side effects. You may report side effects to FDA at 1-800-FDA-1088. Where should I keep my medication? This medication is given in a hospital or clinic. It will not be stored at home. NOTE: This sheet is a summary. It may not cover all possible information. If you have questions about this medicine, talk to your doctor, pharmacist, or health care provider.  2024 Elsevier/Gold Standard (2022-11-30 00:00:00)

## 2023-03-20 NOTE — Progress Notes (Signed)
[facility] OFFICE PROGRESS NOTE  Patient Care Team: Malva Limes, MD as PCP - General (Family Medicine) Marin Roberts, MD as Referring Physician (Internal Medicine) Lemar Livings Merrily Pew, MD as Consulting Physician (General Surgery) Schermerhorn, Ihor Austin, MD as Referring Physician (Obstetrics and Gynecology) Earna Coder, MD as Consulting Physician (Oncology)   SUMMARY OF ONCOLOGIC HISTORY:  # IDA [place] Breeding to gastric Bypass 2002/ menstrual blood loss]; IV iron q 6 M/ B12 IM [thru PCP]; B12 def-   # 2019- ? raynauds' s/p rheuma eval.   INTERVAL HISTORY: Patient ambulating-independently. Alone.  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  Pt doing well.  Complains of mild difficulty sleeping at nighttime.  No dizziness. No visible blood in stool.  Patient unfortunately not compliant with her oral B12.  Patient denies any blood in stools or black or stools.  Denies any worsening fatigue.  Review of Systems  Constitutional:  Positive for malaise/fatigue. Negative for chills, diaphoresis, fever and weight loss.  HENT:  Negative for nosebleeds and sore throat.   Eyes:  Negative for double vision.  Respiratory:  Negative for cough, hemoptysis, sputum production, shortness of breath and wheezing.   Cardiovascular:  Negative for chest pain, palpitations, orthopnea and leg swelling.  Gastrointestinal:  Negative for abdominal pain, blood in stool, constipation, diarrhea, heartburn, melena, nausea and vomiting.  Genitourinary:  Negative for dysuria, frequency and urgency.  Musculoskeletal:  Negative for back pain and joint pain.  Skin: Negative.  Negative for itching and rash.  Neurological:  Negative for dizziness, tingling, focal weakness, weakness and headaches.  Endo/Heme/Allergies:  Does not bruise/bleed easily.  Psychiatric/Behavioral:  Negative for  depression. The patient has insomnia. The patient is not nervous/anxious.    Marland Kitchen   PAST MEDICAL HISTORY :  Past Medical History:  Diagnosis Date   Anemia    Insomnia    Obesity    Ovarian cyst    Upper GI bleeding 2015   gastric ulcer    PAST SURGICAL HISTORY :   Past Surgical History:  Procedure Laterality Date   BREAST CYST ASPIRATION Left 02/02/2014   Dr Lemar Livings   BREAST CYST ASPIRATION Right 02/2018   CHOLECYSTECTOMY  2009   COLONOSCOPY  2014   GASTRIC BYPASS  2006, 2015   2015 revision   GASTRIC RESECTION  2015   SHOULDER ARTHROSCOPY WITH OPEN ROTATOR CUFF REPAIR Right 03/27/2018   Procedure: SHOULDER ARTHROSCOPY WITH OPEN ROTATOR CUFF REPAIR;  Surgeon: Christena Flake, MD;  Location: [facility];  Service: Orthopedics;  Laterality: Right;   SHOULDER CLOSED REDUCTION Right 07/08/2018   Procedure: CLOSED MANIPULATION SHOULDER;  Surgeon: Christena Flake, MD;  Location: [facility];  Service: Orthopedics;  Laterality: Right;   STERIOD INJECTION Right 07/08/2018   Procedure: STEROID INJECTION OF RIGHT SHOULDER;  Surgeon: Christena Flake, MD;  Location: [facility];  Service: Orthopedics;  Laterality: Right;   TONSILLECTOMY     TUBAL LIGATION     UPPER GI ENDOSCOPY      FAMILY HISTORY :   Family History  Problem Relation Age of Onset   Lung cancer Maternal Grandfather    Cancer Maternal Grandfather    Diabetes Mother    Hypertension Mother    Diabetes Father    Hypertension Father    Crohn's disease Son    Rheum arthritis Son    Asthma Son    Cancer Maternal  Grandmother    Breast cancer Maternal Aunt        [phone]   Colon cancer Maternal Uncle     SOCIAL HISTORY:   Social History   Tobacco Use   Smoking status: Never   Smokeless tobacco: Never  Vaping Use   Vaping status: Never Used  Substance Use Topics   Alcohol use: No   Drug use: No    ALLERGIES:  is allergic to nsaids.  MEDICATIONS:  Current Outpatient Medications  Medication Sig Dispense Refill    acetaminophen (TYLENOL) 500 MG tablet Take 1,000 mg by mouth every 6 (six) hours as needed for moderate pain or headache.      amitriptyline (ELAVIL) 50 MG tablet Take 50 mg by mouth at bedtime as needed for sleep.     Calcium-Magnesium-Vitamin D (CALCIUM 500 PO) Take 500 mg by mouth 2 (two) times daily.     Magnesium 400 MG TABS Take 400 mg by mouth daily.     ondansetron (ZOFRAN-ODT) 4 MG disintegrating tablet Take 1 tablet (4 mg total) by mouth every 8 (eight) hours as needed for nausea or vomiting. 30 tablet 1   Semaglutide-Weight Management (WEGOVY) 1.7 MG/0.75ML SOAJ Inject 1.7 mg into the skin once a week. 3 mL 0   Vitamin D, Ergocalciferol, (DRISDOL) 1.25 MG (50000 UNIT) CAPS capsule TAKE 1 CAPSULE ONCE WEEKLY 12 capsule 3   CYANOCOBALAMIN IJ Inject as directed. (Patient not taking: Reported on 03/20/2023)     Probiotic CAPS Take 1 capsule by mouth daily. (Patient not taking: Reported on 03/20/2023)     Semaglutide-Weight Management (WEGOVY) 2.4 MG/0.75ML SOAJ Inject 2.4 mg into the skin once a week. (Patient not taking: Reported on 03/20/2023) 9 mL 3   No current facility-administered medications for this visit.    PHYSICAL EXAMINATION: ECOG PERFORMANCE STATUS: 0 - Asymptomatic  BP (!) 145/66 (BP Location: Left Arm, Patient Position: Sitting, Cuff Size: Normal)   Pulse 77   Temp 98.1 F (36.7 C) (Tympanic)   Ht 5\' 4"  (1.626 m)   SpO2 100%   BMI 24.89 kg/m   There were no vitals filed for this visit.   Physical Exam HENT:     Head: Normocephalic and atraumatic.     Mouth/Throat:     Pharynx: No oropharyngeal exudate.  Eyes:     Pupils: Pupils are equal, round, and reactive to light.  Cardiovascular:     Rate and Rhythm: Normal rate and regular rhythm.  Pulmonary:     Effort: No respiratory distress.     Breath sounds: No wheezing.  Abdominal:     General: Bowel sounds are normal. There is no distension.     Palpations: Abdomen is soft. There is no mass.      Tenderness: There is no abdominal tenderness. There is no guarding or rebound.  Musculoskeletal:        General: No tenderness. Normal range of motion.     Cervical back: Normal range of motion and neck supple.  Skin:    General: Skin is warm.  Neurological:     Mental Status: She is alert and oriented to person, place, and time.  Psychiatric:        Mood and Affect: Affect normal.     LABORATORY DATA:  I have reviewed the data as listed    Component Value Date/Time   NA 137 03/14/2023 0807   NA 139 11/10/2021 1600   NA 142 03/23/2013 0428   K 3.5 03/14/2023 9528  K 4.1 06/29/2013 0902   CL 104 03/14/2023 0807   CL 112 (H) 03/23/2013 0428   CO2 25 03/14/2023 0807   CO2 24 03/23/2013 0428   GLUCOSE 118 (H) 03/14/2023 0807   GLUCOSE 90 03/23/2013 0428   BUN 10 03/14/2023 0807   BUN 12 11/10/2021 1600   BUN 5 (L) 03/23/2013 0428   CREATININE 0.89 03/14/2023 0807   CREATININE 0.75 06/12/2013 0842   CALCIUM 8.9 03/14/2023 0807   CALCIUM 7.7 (L) 03/23/2013 0428   PROT 7.2 11/10/2021 1600   PROT 6.1 (L) 03/20/2013 1843   ALBUMIN 4.2 11/10/2021 1600   ALBUMIN 3.0 (L) 03/20/2013 1843   AST 22 11/10/2021 1600   AST 25 03/20/2013 1843   ALT 23 11/10/2021 1600   ALT 49 03/20/2013 1843   ALKPHOS 91 11/10/2021 1600   ALKPHOS 84 03/20/2013 1843   BILITOT 0.3 11/10/2021 1600   BILITOT 0.1 (L) 03/20/2013 1843   GFRNONAA >60 03/14/2023 0807   GFRNONAA >60 06/12/2013 0842   GFRAA 87 11/20/2019 0859   GFRAA >60 06/12/2013 0842    No results found for: "SPEP", "UPEP"  Lab Results  Component Value Date   WBC 3.4 (L) 03/14/2023   NEUTROABS 2.1 03/14/2023   HGB 11.9 (L) 03/14/2023   HCT 38.9 03/14/2023   MCV 85.1 03/14/2023   PLT 288 03/14/2023      Chemistry      Component Value Date/Time   NA 137 03/14/2023 0807   NA 139 11/10/2021 1600   NA 142 03/23/2013 0428   K 3.5 03/14/2023 0807   K 4.1 06/29/2013 0902   CL 104 03/14/2023 0807   CL 112 (H) 03/23/2013 0428    CO2 25 03/14/2023 0807   CO2 24 03/23/2013 0428   BUN 10 03/14/2023 0807   BUN 12 11/10/2021 1600   BUN 5 (L) 03/23/2013 0428   CREATININE 0.89 03/14/2023 0807   CREATININE 0.75 06/12/2013 0842   GLU 82 10/16/2013 0000      Component Value Date/Time   CALCIUM 8.9 03/14/2023 0807   CALCIUM 7.7 (L) 03/23/2013 0428   ALKPHOS 91 11/10/2021 1600   ALKPHOS 84 03/20/2013 1843   AST 22 11/10/2021 1600   AST 25 03/20/2013 1843   ALT 23 11/10/2021 1600   ALT 49 03/20/2013 1843   BILITOT 0.3 11/10/2021 1600   BILITOT 0.1 (L) 03/20/2013 1843        ASSESSMENT & PLAN:  Acquired iron deficiency anemia due to decreased absorption # 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  # Hemoglobin is 11.9; AUG 2024- IDA- Proceed  IV ferrahem.   # B12 deficiency-Dec 2021- AUG 2023- B12 injection q 26M.  1 week- labs prior sec to insurance  # DISPOSITION: # B12 injection and Ferrahem today # Ferrahem next week-  # Follow up in 6 months-MD;  1 week prior-labs cbc; bmp; vit D 25-OH levels; b12 levels/ferritin/iron studies; possible ferrahem/ b12 injection-Dr.B  Cc;Dr.[name].      Earna Coder, MD 03/20/2023 2:40 PM

## 2023-03-20 NOTE — Assessment & Plan Note (Addendum)
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  # Hemoglobin is 11.9; AUG 2024- IDA- Proceed  IV ferrahem.   # B12 deficiency-Dec 2021- AUG 2023- B12 injection q 70M.  1 week- labs prior sec to insurance  # DISPOSITION: # B12 injection and Ferrahem today # Ferrahem next week-  # Follow up in 6 months-MD;  1 week prior-labs cbc; bmp; vit D 25-OH levels; b12 levels/ferritin/iron studies; possible ferrahem/ b12 injection-Dr.B  Cc;Dr.[name].

## 2023-03-20 NOTE — Progress Notes (Signed)
Fatigue/weakness: no Dyspena: no Light headedness: no Blood in stool:  no 

## 2023-03-27 ENCOUNTER — Inpatient Hospital Stay: Payer: BC Managed Care – PPO

## 2023-03-27 ENCOUNTER — Ambulatory Visit: Payer: BC Managed Care – PPO

## 2023-03-27 VITALS — BP 136/64 | HR 71 | Temp 98.1°F | Resp 16

## 2023-03-27 DIAGNOSIS — D500 Iron deficiency anemia secondary to blood loss (chronic): Secondary | ICD-10-CM

## 2023-03-27 MED ORDER — SODIUM CHLORIDE 0.9 % IV SOLN
Freq: Once | INTRAVENOUS | Status: AC
  Filled 2023-03-27: qty 250

## 2023-03-27 MED ORDER — SODIUM CHLORIDE 0.9 % IV SOLN
510.0000 mg | Freq: Once | INTRAVENOUS | Status: AC
  Administered 2023-03-27: 510 mg via INTRAVENOUS
  Filled 2023-03-27: qty 510

## 2023-03-27 NOTE — Patient Instructions (Signed)
Ferumoxytol Injection What is this medication? FERUMOXYTOL (FER ue MOX i tol) treats low levels of iron in your body (iron deficiency anemia). Iron is a mineral that plays an important role in making red blood cells, which carry oxygen from your lungs to the rest of your body. This medicine may be used for other purposes; ask your health care provider or pharmacist if you have questions. COMMON BRAND NAME(S): Feraheme What should I tell my care team before I take this medication? They need to know if you have any of these conditions: Anemia not caused by low iron levels High levels of iron in the blood Magnetic resonance imaging (MRI) test scheduled An unusual or allergic reaction to iron, other medications, foods, dyes, or preservatives Pregnant or trying to get pregnant Breastfeeding How should I use this medication? This medication is injected into a vein. It is given by your care team in a hospital or clinic setting. Talk to your care team the use of this medication in children. Special care may be needed. Overdosage: If you think you have taken too much of this medicine contact a poison control center or emergency room at once. NOTE: This medicine is only for you. Do not share this medicine with others. What if I miss a dose? It is important not to miss your dose. Call your care team if you are unable to keep an appointment. What may interact with this medication? Other iron products This list may not describe all possible interactions. Give your health care provider a list of all the medicines, herbs, non-prescription drugs, or dietary supplements you use. Also tell them if you smoke, drink alcohol, or use illegal drugs. Some items may interact with your medicine. What should I watch for while using this medication? Visit your care team regularly. Tell your care team if your symptoms do not start to get better or if they get worse. You may need blood work done while you are taking this  medication. You may need to follow a special diet. Talk to your care team. Foods that contain iron include: whole grains/cereals, dried fruits, beans, or peas, leafy green vegetables, and organ meats (liver, kidney). What side effects may I notice from receiving this medication? Side effects that you should report to your care team as soon as possible: Allergic reactions--skin rash, itching, hives, swelling of the face, lips, tongue, or throat Low blood pressure--dizziness, feeling faint or lightheaded, blurry vision Shortness of breath Side effects that usually do not require medical attention (report to your care team if they continue or are bothersome): Flushing Headache Joint pain Muscle pain Nausea Pain, redness, or irritation at injection site This list may not describe all possible side effects. Call your doctor for medical advice about side effects. You may report side effects to FDA at 1-800-FDA-1088. Where should I keep my medication? This medication is given in a hospital or clinic. It will not be stored at home. NOTE: This sheet is a summary. It may not cover all possible information. If you have questions about this medicine, talk to your doctor, pharmacist, or health care provider.  2024 Elsevier/Gold Standard (2022-11-30 00:00:00)

## 2023-03-28 ENCOUNTER — Encounter: Payer: Self-pay | Admitting: Family Medicine

## 2023-03-29 ENCOUNTER — Other Ambulatory Visit: Payer: Self-pay

## 2023-04-10 MED ORDER — WEGOVY 2.4 MG/0.75ML ~~LOC~~ SOAJ: 2.4000 mg | SUBCUTANEOUS | 3 refills | Status: DC

## 2023-06-19 ENCOUNTER — Other Ambulatory Visit: Payer: Self-pay | Admitting: Obstetrics and Gynecology

## 2023-06-19 DIAGNOSIS — Z1231 Encounter for screening mammogram for malignant neoplasm of breast: Secondary | ICD-10-CM

## 2023-06-26 ENCOUNTER — Other Ambulatory Visit: Payer: Self-pay

## 2023-06-26 DIAGNOSIS — E663 Overweight: Secondary | ICD-10-CM

## 2023-06-26 DIAGNOSIS — E669 Obesity, unspecified: Secondary | ICD-10-CM

## 2023-06-26 MED ORDER — WEGOVY 2.4 MG/0.75ML ~~LOC~~ SOAJ
2.4000 mg | SUBCUTANEOUS | 3 refills | Status: DC
Start: 2023-06-26 — End: 2023-06-26

## 2023-06-26 MED ORDER — WEGOVY 2.4 MG/0.75ML ~~LOC~~ SOAJ: 2.4000 mg | SUBCUTANEOUS | 3 refills | Status: DC

## 2023-07-19 ENCOUNTER — Ambulatory Visit
Admission: RE | Admit: 2023-07-19 | Discharge: 2023-07-19 | Disposition: A | Discharge status: Home / Self Care | Payer: BC Managed Care – PPO | Source: Ambulatory Visit | Attending: Obstetrics and Gynecology | Admitting: Obstetrics and Gynecology

## 2023-07-19 ENCOUNTER — Encounter: Payer: Self-pay | Admitting: Internal Medicine

## 2023-07-19 DIAGNOSIS — Z1231 Encounter for screening mammogram for malignant neoplasm of breast: Secondary | ICD-10-CM | POA: Diagnosis present

## 2023-09-04 ENCOUNTER — Other Ambulatory Visit: Payer: Self-pay | Admitting: Internal Medicine

## 2023-09-04 NOTE — Progress Notes (Signed)
 Switched to venofer- but prior hx of reaction to vefnoer. Again awaiting ferrahem approval-   Please follow up-

## 2023-09-11 ENCOUNTER — Inpatient Hospital Stay: Payer: BC Managed Care – PPO | Attending: Internal Medicine

## 2023-09-11 DIAGNOSIS — R5383 Other fatigue: Secondary | ICD-10-CM | POA: Diagnosis not present

## 2023-09-11 DIAGNOSIS — N920 Excessive and frequent menstruation with regular cycle: Secondary | ICD-10-CM | POA: Diagnosis present

## 2023-09-11 DIAGNOSIS — Z801 Family history of malignant neoplasm of trachea, bronchus and lung: Secondary | ICD-10-CM | POA: Diagnosis not present

## 2023-09-11 DIAGNOSIS — E538 Deficiency of other specified B group vitamins: Secondary | ICD-10-CM | POA: Diagnosis present

## 2023-09-11 DIAGNOSIS — D500 Iron deficiency anemia secondary to blood loss (chronic): Secondary | ICD-10-CM | POA: Insufficient documentation

## 2023-09-11 DIAGNOSIS — Z803 Family history of malignant neoplasm of breast: Secondary | ICD-10-CM | POA: Insufficient documentation

## 2023-09-11 DIAGNOSIS — Z9884 Bariatric surgery status: Secondary | ICD-10-CM | POA: Diagnosis not present

## 2023-09-11 DIAGNOSIS — Z800 Family history of malignant neoplasm of digestive organs: Secondary | ICD-10-CM | POA: Insufficient documentation

## 2023-09-11 DIAGNOSIS — D508 Other iron deficiency anemias: Secondary | ICD-10-CM

## 2023-09-11 LAB — CBC WITH DIFFERENTIAL (CANCER CENTER ONLY)
Abs Immature Granulocytes: 0.01 10*3/uL (ref 0.00–0.07)
Basophils Absolute: 0 10*3/uL (ref 0.0–0.1)
Basophils Relative: 1 %
Eosinophils Absolute: 0.1 10*3/uL (ref 0.0–0.5)
Eosinophils Relative: 3 %
HCT: 36.9 % (ref 36.0–46.0)
Hemoglobin: 12.2 g/dL (ref 12.0–15.0)
Immature Granulocytes: 0 %
Lymphocytes Relative: 31 %
Lymphs Abs: 1.3 10*3/uL (ref 0.7–4.0)
MCH: 30 pg (ref 26.0–34.0)
MCHC: 33.1 g/dL (ref 30.0–36.0)
MCV: 90.9 fL (ref 80.0–100.0)
Monocytes Absolute: 0.3 10*3/uL (ref 0.1–1.0)
Monocytes Relative: 8 %
Neutro Abs: 2.3 10*3/uL (ref 1.7–7.7)
Neutrophils Relative %: 57 %
Platelet Count: 254 10*3/uL (ref 150–400)
RBC: 4.06 MIL/uL (ref 3.87–5.11)
RDW: 12.9 % (ref 11.5–15.5)
WBC Count: 4.1 10*3/uL (ref 4.0–10.5)
nRBC: 0 % (ref 0.0–0.2)

## 2023-09-11 LAB — BASIC METABOLIC PANEL
Anion gap: 5 (ref 5–15)
BUN: 20 mg/dL (ref 6–20)
CO2: 23 mmol/L (ref 22–32)
Calcium: 8.4 mg/dL — ABNORMAL LOW (ref 8.9–10.3)
Chloride: 105 mmol/L (ref 98–111)
Creatinine, Ser: 0.82 mg/dL (ref 0.44–1.00)
GFR, Estimated: >60 mL/min (ref >60)
Glucose, Bld: 101 mg/dL — ABNORMAL HIGH (ref 70–99)
Potassium: 3.8 mmol/L (ref 3.5–5.1)
Sodium: 133 mmol/L — ABNORMAL LOW (ref 135–145)

## 2023-09-11 LAB — FERRITIN: Ferritin: 127 ng/mL (ref 11–307)

## 2023-09-11 LAB — VITAMIN B12: Vitamin B-12: 249 pg/mL (ref 180–914)

## 2023-09-11 LAB — IRON AND TIBC
Iron: 76 ug/dL (ref 28–170)
Saturation Ratios: 20 % (ref 10.4–31.8)
TIBC: 389 ug/dL (ref 250–450)
UIBC: 313 ug/dL

## 2023-09-11 LAB — VITAMIN D 25 HYDROXY (VIT D DEFICIENCY, FRACTURES): Vit D, 25-Hydroxy: 56.61 ng/mL (ref 30–100)

## 2023-09-18 ENCOUNTER — Inpatient Hospital Stay: Payer: BC Managed Care – PPO

## 2023-09-18 ENCOUNTER — Encounter: Payer: Self-pay | Admitting: Internal Medicine

## 2023-09-18 ENCOUNTER — Inpatient Hospital Stay (HOSPITAL_BASED_OUTPATIENT_CLINIC_OR_DEPARTMENT_OTHER): Payer: BC Managed Care – PPO | Admitting: Internal Medicine

## 2023-09-18 DIAGNOSIS — D508 Other iron deficiency anemias: Secondary | ICD-10-CM

## 2023-09-18 DIAGNOSIS — D500 Iron deficiency anemia secondary to blood loss (chronic): Secondary | ICD-10-CM

## 2023-09-18 MED ORDER — CYANOCOBALAMIN 1000 MCG/ML IJ SOLN
1000.0000 ug | Freq: Once | INTRAMUSCULAR | Status: AC
  Administered 2023-09-18: 1000 ug via INTRAMUSCULAR

## 2023-09-18 NOTE — Assessment & Plan Note (Addendum)
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 Again reminded on barimelt.   # Hemoglobin is 12.08 September 2023- I sat 20%, ferrint 127 but with gastric malabsorption- pending insurance Proceed  IV ferrahem.   # B12 deficiency- B12 injection q 93M. Reminded b12 pills at home.   1 week- labs prior sec to insurance  # DISPOSITION: # Note for work # B12 injection today # HOLD  Ferrahem today- pending insurance.  # please schedule 1 ferrhem infusion when insurance approves it- # Follow up in 6 months-MD;  1 week prior-labs cbc; bmp; vit D 25-OH levels; b12 levels/ferritin/iron studies; possible ferrahem/ b12 injection-Dr.B  Cc;Dr.[name].

## 2023-09-18 NOTE — Patient Instructions (Signed)
Recommend barimelt+ iron [over-the-counter medication/can buy online].  2 a day- under the tongue. These pills need to dissolve under the tongue/and should be absorbed into your bloodstream.

## 2023-09-18 NOTE — Progress Notes (Signed)
 Fatigue/weakness: NO Dyspena: NO  Light headedness: NO  Blood in stool: NO

## 2023-09-18 NOTE — Progress Notes (Signed)
 [facility] OFFICE PROGRESS NOTE  Patient Care Team: Malva Limes, MD as PCP - General (Family Medicine) Marin Roberts, MD as Referring Physician (Internal Medicine) Lemar Livings Merrily Pew, MD as Consulting Physician (General Surgery) Schermerhorn, Ihor Austin, MD as Referring Physician (Obstetrics and Gynecology) Earna Coder, MD as Consulting Physician (Oncology)   SUMMARY OF ONCOLOGIC HISTORY:  # IDA [place] Breeding to gastric Bypass 2002/ menstrual blood loss]; IV iron q 6 M/ B12 IM [thru PCP]; B12 def-   # 2019- ? raynauds' s/p rheuma eval.   INTERVAL HISTORY: Patient ambulating-independently. Alone.  A very pleasant 58 year-old female patient with above history of iron deficiency anemia secondary to gastric bypass- is here for follow-up.  Pt doing well.   No dizziness. No visible blood in stool.  Patient unfortunately not compliant with her oral B12.  Patient denies any blood in stools or black or stools.  Denies any worsening fatigue.  Review of Systems  Constitutional:  Positive for malaise/fatigue. Negative for chills, diaphoresis, fever and weight loss.  HENT:  Negative for nosebleeds and sore throat.   Eyes:  Negative for double vision.  Respiratory:  Negative for cough, hemoptysis, sputum production, shortness of breath and wheezing.   Cardiovascular:  Negative for chest pain, palpitations, orthopnea and leg swelling.  Gastrointestinal:  Negative for abdominal pain, blood in stool, constipation, diarrhea, heartburn, melena, nausea and vomiting.  Genitourinary:  Negative for dysuria, frequency and urgency.  Musculoskeletal:  Negative for back pain and joint pain.  Skin: Negative.  Negative for itching and rash.  Neurological:  Negative for dizziness, tingling, focal weakness, weakness and headaches.  Endo/Heme/Allergies:  Does not bruise/bleed easily.  Psychiatric/Behavioral:  Negative for depression. The patient has insomnia. The patient is not  nervous/anxious.    Marland Kitchen   PAST MEDICAL HISTORY :  Past Medical History:  Diagnosis Date   Anemia    Insomnia    Obesity    Ovarian cyst    Upper GI bleeding 2015   gastric ulcer    PAST SURGICAL HISTORY :   Past Surgical History:  Procedure Laterality Date   BREAST CYST ASPIRATION Left 02/02/2014   Dr Lemar Livings   BREAST CYST ASPIRATION Right 02/2018   CHOLECYSTECTOMY  2009   COLONOSCOPY  2014   GASTRIC BYPASS  2006, 2015   2015 revision   GASTRIC RESECTION  2015   SHOULDER ARTHROSCOPY WITH OPEN ROTATOR CUFF REPAIR Right 03/27/2018   Procedure: SHOULDER ARTHROSCOPY WITH OPEN ROTATOR CUFF REPAIR;  Surgeon: Christena Flake, MD;  Location: [facility];  Service: Orthopedics;  Laterality: Right;   SHOULDER CLOSED REDUCTION Right 07/08/2018   Procedure: CLOSED MANIPULATION SHOULDER;  Surgeon: Christena Flake, MD;  Location: [facility];  Service: Orthopedics;  Laterality: Right;   STERIOD INJECTION Right 07/08/2018   Procedure: STEROID INJECTION OF RIGHT SHOULDER;  Surgeon: Christena Flake, MD;  Location: [facility];  Service: Orthopedics;  Laterality: Right;   TONSILLECTOMY     TUBAL LIGATION     UPPER GI ENDOSCOPY      FAMILY HISTORY :   Family History  Problem Relation Age of Onset   Lung cancer Maternal Grandfather    Cancer Maternal Grandfather    Diabetes Mother    Hypertension Mother    Diabetes Father    Hypertension Father    Crohn's disease Son    Rheum arthritis Son    Asthma Son    Cancer Maternal Grandmother    Breast cancer Maternal  Celine Ahr        [phone]   Colon cancer Maternal Uncle     SOCIAL HISTORY:   Social History   Tobacco Use   Smoking status: Never   Smokeless tobacco: Never  Vaping Use   Vaping status: Never Used  Substance Use Topics   Alcohol use: No   Drug use: No    ALLERGIES:  is allergic to nsaids.  MEDICATIONS:  Current Outpatient Medications  Medication Sig Dispense Refill   acetaminophen (TYLENOL) 500 MG tablet Take 1,000 mg by mouth  every 6 (six) hours as needed for moderate pain or headache.      amitriptyline (ELAVIL) 50 MG tablet Take 50 mg by mouth at bedtime as needed for sleep.     Calcium-Magnesium-Vitamin D (CALCIUM 500 PO) Take 500 mg by mouth 2 (two) times daily.     Semaglutide-Weight Management (WEGOVY) 2.4 MG/0.75ML SOAJ Inject 2.4 mg into the skin once a week. 9 mL 3   Vitamin D, Ergocalciferol, (DRISDOL) 1.25 MG (50000 UNIT) CAPS capsule TAKE 1 CAPSULE ONCE WEEKLY 12 capsule 3   CYANOCOBALAMIN IJ Inject as directed. (Patient not taking: Reported on 09/18/2023)     No current facility-administered medications for this visit.    PHYSICAL EXAMINATION: ECOG PERFORMANCE STATUS: 0 - Asymptomatic  BP 124/71 (BP Location: Left Arm, Patient Position: Sitting, Cuff Size: Normal)   Pulse 67   Temp (!) 97.5 F (36.4 C) (Tympanic)   Resp 16   Ht 5\' 4"  (1.626 m)   Wt 151 lb 6.4 oz (68.7 kg)   LMP 02/26/2018   SpO2 100%   BMI 25.99 kg/m   Filed Weights   09/18/23 1046  Weight: 151 lb 6.4 oz (68.7 kg)     Physical Exam HENT:     Head: Normocephalic and atraumatic.     Mouth/Throat:     Pharynx: No oropharyngeal exudate.  Eyes:     Pupils: Pupils are equal, round, and reactive to light.  Cardiovascular:     Rate and Rhythm: Normal rate and regular rhythm.  Pulmonary:     Effort: No respiratory distress.     Breath sounds: No wheezing.  Abdominal:     General: Bowel sounds are normal. There is no distension.     Palpations: Abdomen is soft. There is no mass.     Tenderness: There is no abdominal tenderness. There is no guarding or rebound.  Musculoskeletal:        General: No tenderness. Normal range of motion.     Cervical back: Normal range of motion and neck supple.  Skin:    General: Skin is warm.  Neurological:     Mental Status: She is alert and oriented to person, place, and time.  Psychiatric:        Mood and Affect: Affect normal.     LABORATORY DATA:  I have reviewed the data as  listed    Component Value Date/Time   NA 133 (L) 09/11/2023 1445   NA 139 11/10/2021 1600   NA 142 03/23/2013 0428   K 3.8 09/11/2023 1445   K 4.1 06/29/2013 0902   CL 105 09/11/2023 1445   CL 112 (H) 03/23/2013 0428   CO2 23 09/11/2023 1445   CO2 24 03/23/2013 0428   GLUCOSE 101 (H) 09/11/2023 1445   GLUCOSE 90 03/23/2013 0428   BUN 20 09/11/2023 1445   BUN 12 11/10/2021 1600   BUN 5 (L) 03/23/2013 0428   CREATININE 0.82 09/11/2023 1445  CREATININE 0.89 03/14/2023 0807   CREATININE 0.75 06/12/2013 0842   CALCIUM 8.4 (L) 09/11/2023 1445   CALCIUM 7.7 (L) 03/23/2013 0428   PROT 7.2 11/10/2021 1600   PROT 6.1 (L) 03/20/2013 1843   ALBUMIN 4.2 11/10/2021 1600   ALBUMIN 3.0 (L) 03/20/2013 1843   AST 22 11/10/2021 1600   AST 25 03/20/2013 1843   ALT 23 11/10/2021 1600   ALT 49 03/20/2013 1843   ALKPHOS 91 11/10/2021 1600   ALKPHOS 84 03/20/2013 1843   BILITOT 0.3 11/10/2021 1600   BILITOT 0.1 (L) 03/20/2013 1843   GFRNONAA >60 09/11/2023 1445   GFRNONAA >60 03/14/2023 0807   GFRNONAA >60 06/12/2013 0842   GFRAA 87 11/20/2019 0859   GFRAA >60 06/12/2013 0842    No results found for: "SPEP", "UPEP"  Lab Results  Component Value Date   WBC 4.1 09/11/2023   NEUTROABS 2.3 09/11/2023   HGB 12.2 09/11/2023   HCT 36.9 09/11/2023   MCV 90.9 09/11/2023   PLT 254 09/11/2023      Chemistry      Component Value Date/Time   NA 133 (L) 09/11/2023 1445   NA 139 11/10/2021 1600   NA 142 03/23/2013 0428   K 3.8 09/11/2023 1445   K 4.1 06/29/2013 0902   CL 105 09/11/2023 1445   CL 112 (H) 03/23/2013 0428   CO2 23 09/11/2023 1445   CO2 24 03/23/2013 0428   BUN 20 09/11/2023 1445   BUN 12 11/10/2021 1600   BUN 5 (L) 03/23/2013 0428   CREATININE 0.82 09/11/2023 1445   CREATININE 0.89 03/14/2023 0807   CREATININE 0.75 06/12/2013 0842   GLU 82 10/16/2013 0000      Component Value Date/Time   CALCIUM 8.4 (L) 09/11/2023 1445   CALCIUM 7.7 (L) 03/23/2013 0428   ALKPHOS 91  11/10/2021 1600   ALKPHOS 84 03/20/2013 1843   AST 22 11/10/2021 1600   AST 25 03/20/2013 1843   ALT 23 11/10/2021 1600   ALT 49 03/20/2013 1843   BILITOT 0.3 11/10/2021 1600   BILITOT 0.1 (L) 03/20/2013 1843        ASSESSMENT & PLAN:  Acquired iron deficiency anemia due to decreased absorption # 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 Again reminded on barimelt.   # Hemoglobin is 12.08 September 2023- I sat 20%, ferrint 127 but with gastric malabsorption- pending insurance Proceed  IV ferrahem.   # B12 deficiency- B12 injection q 32M. Reminded b12 pills at home.   1 week- labs prior sec to insurance  # DISPOSITION: # Note for work # B12 injection today # HOLD  Ferrahem today- pending insurance.  # please schedule 1 ferrhem infusion when insurance approves it- # Follow up in 6 months-MD;  1 week prior-labs cbc; bmp; vit D 25-OH levels; b12 levels/ferritin/iron studies; possible ferrahem/ b12 injection-Dr.B  Cc;Dr.[name].       Earna Coder, MD 09/18/2023 1:10 PM

## 2023-10-03 ENCOUNTER — Other Ambulatory Visit: Payer: Self-pay | Admitting: Internal Medicine

## 2023-10-03 ENCOUNTER — Inpatient Hospital Stay

## 2023-10-03 VITALS — BP 140/66 | HR 63 | Temp 96.0°F | Resp 16

## 2023-10-03 DIAGNOSIS — D500 Iron deficiency anemia secondary to blood loss (chronic): Secondary | ICD-10-CM

## 2023-10-03 MED ORDER — SODIUM CHLORIDE 0.9 % IV SOLN
510.0000 mg | Freq: Once | INTRAVENOUS | Status: AC
  Administered 2023-10-03: 510 mg via INTRAVENOUS
  Filled 2023-10-03: qty 510

## 2023-10-03 MED ORDER — SODIUM CHLORIDE 0.9 % IV SOLN
INTRAVENOUS | Status: DC
  Filled 2023-10-03: qty 250

## 2023-10-25 ENCOUNTER — Ambulatory Visit: Admitting: Family Medicine

## 2023-12-09 ENCOUNTER — Emergency Department

## 2023-12-09 ENCOUNTER — Inpatient Hospital Stay
Admission: EM | Admit: 2023-12-09 | Discharge: 2023-12-11 | DRG: 064 | Disposition: A | Discharge status: Home / Self Care | Attending: Internal Medicine | Admitting: Internal Medicine

## 2023-12-09 ENCOUNTER — Observation Stay

## 2023-12-09 ENCOUNTER — Other Ambulatory Visit: Payer: Self-pay

## 2023-12-09 DIAGNOSIS — I2510 Atherosclerotic heart disease of native coronary artery without angina pectoris: Secondary | ICD-10-CM | POA: Diagnosis present

## 2023-12-09 DIAGNOSIS — F4320 Adjustment disorder, unspecified: Secondary | ICD-10-CM | POA: Diagnosis not present

## 2023-12-09 DIAGNOSIS — E162 Hypoglycemia, unspecified: Secondary | ICD-10-CM

## 2023-12-09 DIAGNOSIS — N1330 Unspecified hydronephrosis: Secondary | ICD-10-CM

## 2023-12-09 DIAGNOSIS — R569 Unspecified convulsions: Secondary | ICD-10-CM

## 2023-12-09 DIAGNOSIS — R748 Abnormal levels of other serum enzymes: Secondary | ICD-10-CM

## 2023-12-09 DIAGNOSIS — Z79899 Other long term (current) drug therapy: Secondary | ICD-10-CM

## 2023-12-09 DIAGNOSIS — I674 Hypertensive encephalopathy: Secondary | ICD-10-CM

## 2023-12-09 DIAGNOSIS — E663 Overweight: Secondary | ICD-10-CM

## 2023-12-09 DIAGNOSIS — Z9884 Bariatric surgery status: Secondary | ICD-10-CM

## 2023-12-09 DIAGNOSIS — G4700 Insomnia, unspecified: Secondary | ICD-10-CM | POA: Diagnosis present

## 2023-12-09 DIAGNOSIS — E785 Hyperlipidemia, unspecified: Secondary | ICD-10-CM | POA: Diagnosis present

## 2023-12-09 DIAGNOSIS — R7989 Other specified abnormal findings of blood chemistry: Secondary | ICD-10-CM

## 2023-12-09 DIAGNOSIS — R4182 Altered mental status, unspecified: Secondary | ICD-10-CM | POA: Diagnosis present

## 2023-12-09 DIAGNOSIS — R4701 Aphasia: Secondary | ICD-10-CM | POA: Diagnosis present

## 2023-12-09 DIAGNOSIS — Z8249 Family history of ischemic heart disease and other diseases of the circulatory system: Secondary | ICD-10-CM | POA: Diagnosis not present

## 2023-12-09 DIAGNOSIS — G928 Other toxic encephalopathy: Secondary | ICD-10-CM | POA: Diagnosis not present

## 2023-12-09 DIAGNOSIS — R413 Other amnesia: Principal | ICD-10-CM | POA: Diagnosis present

## 2023-12-09 DIAGNOSIS — I739 Peripheral vascular disease, unspecified: Secondary | ICD-10-CM | POA: Diagnosis not present

## 2023-12-09 DIAGNOSIS — R7401 Elevation of levels of liver transaminase levels: Secondary | ICD-10-CM | POA: Diagnosis not present

## 2023-12-09 DIAGNOSIS — Z6826 Body mass index (BMI) 26.0-26.9, adult: Secondary | ICD-10-CM

## 2023-12-09 DIAGNOSIS — I161 Hypertensive emergency: Secondary | ICD-10-CM | POA: Diagnosis present

## 2023-12-09 DIAGNOSIS — F4321 Adjustment disorder with depressed mood: Secondary | ICD-10-CM | POA: Diagnosis present

## 2023-12-09 DIAGNOSIS — G9340 Encephalopathy, unspecified: Secondary | ICD-10-CM | POA: Diagnosis present

## 2023-12-09 DIAGNOSIS — Z7985 Long-term (current) use of injectable non-insulin antidiabetic drugs: Secondary | ICD-10-CM | POA: Diagnosis not present

## 2023-12-09 DIAGNOSIS — G9341 Metabolic encephalopathy: Secondary | ICD-10-CM | POA: Diagnosis present

## 2023-12-09 DIAGNOSIS — I639 Cerebral infarction, unspecified: Secondary | ICD-10-CM | POA: Diagnosis not present

## 2023-12-09 DIAGNOSIS — R29703 NIHSS score 3: Secondary | ICD-10-CM | POA: Diagnosis present

## 2023-12-09 DIAGNOSIS — K769 Liver disease, unspecified: Secondary | ICD-10-CM | POA: Diagnosis present

## 2023-12-09 DIAGNOSIS — G4709 Other insomnia: Secondary | ICD-10-CM | POA: Diagnosis not present

## 2023-12-09 DIAGNOSIS — I10 Essential (primary) hypertension: Secondary | ICD-10-CM | POA: Diagnosis present

## 2023-12-09 DIAGNOSIS — F5104 Psychophysiologic insomnia: Secondary | ICD-10-CM | POA: Diagnosis present

## 2023-12-09 DIAGNOSIS — I63541 Cerebral infarction due to unspecified occlusion or stenosis of right cerebellar artery: Principal | ICD-10-CM | POA: Diagnosis present

## 2023-12-09 DIAGNOSIS — I6389 Other cerebral infarction: Secondary | ICD-10-CM | POA: Diagnosis not present

## 2023-12-09 DIAGNOSIS — F419 Anxiety disorder, unspecified: Secondary | ICD-10-CM | POA: Diagnosis present

## 2023-12-09 HISTORY — DX: Cerebral infarction, unspecified: I63.9

## 2023-12-09 LAB — URINALYSIS, ROUTINE W REFLEX MICROSCOPIC
Bilirubin Urine: NEGATIVE
Glucose, UA: 100 mg/dL — AB
Hgb urine dipstick: NEGATIVE
Ketones, ur: NEGATIVE mg/dL
Nitrite: NEGATIVE
Protein, ur: NEGATIVE mg/dL
Specific Gravity, Urine: 1.015 (ref 1.005–1.030)
pH: 8.5 — ABNORMAL HIGH (ref 5.0–8.0)

## 2023-12-09 LAB — CBC
HCT: 40.3 % (ref 36.0–46.0)
Hemoglobin: 13.8 g/dL (ref 12.0–15.0)
MCH: 30.9 pg (ref 26.0–34.0)
MCHC: 34.2 g/dL (ref 30.0–36.0)
MCV: 90.2 fL (ref 80.0–100.0)
Platelets: 231 10*3/uL (ref 150–400)
RBC: 4.47 MIL/uL (ref 3.87–5.11)
RDW: 12.8 % (ref 11.5–15.5)
WBC: 4.1 10*3/uL (ref 4.0–10.5)
nRBC: 0 % (ref 0.0–0.2)

## 2023-12-09 LAB — PROTIME-INR
INR: 0.9 (ref 0.8–1.2)
Prothrombin Time: 12.2 s (ref 11.4–15.2)

## 2023-12-09 LAB — DIFFERENTIAL
Abs Immature Granulocytes: 0.01 10*3/uL (ref 0.00–0.07)
Basophils Absolute: 0 10*3/uL (ref 0.0–0.1)
Basophils Relative: 1 %
Eosinophils Absolute: 0.1 10*3/uL (ref 0.0–0.5)
Eosinophils Relative: 2 %
Immature Granulocytes: 0 %
Lymphocytes Relative: 27 %
Lymphs Abs: 1.1 10*3/uL (ref 0.7–4.0)
Monocytes Absolute: 0.3 10*3/uL (ref 0.1–1.0)
Monocytes Relative: 8 %
Neutro Abs: 2.5 10*3/uL (ref 1.7–7.7)
Neutrophils Relative %: 62 %

## 2023-12-09 LAB — COMPREHENSIVE METABOLIC PANEL WITH GFR
ALT: 495 U/L — ABNORMAL HIGH (ref 0–44)
AST: 451 U/L — ABNORMAL HIGH (ref 15–41)
Albumin: 3.8 g/dL (ref 3.5–5.0)
Alkaline Phosphatase: 144 U/L — ABNORMAL HIGH (ref 38–126)
Anion gap: 8 (ref 5–15)
BUN: 10 mg/dL (ref 6–20)
CO2: 27 mmol/L (ref 22–32)
Calcium: 9.4 mg/dL (ref 8.9–10.3)
Chloride: 106 mmol/L (ref 98–111)
Creatinine, Ser: 0.76 mg/dL (ref 0.44–1.00)
GFR, Estimated: >60 mL/min (ref >60)
Glucose, Bld: 109 mg/dL — ABNORMAL HIGH (ref 70–99)
Potassium: 3.4 mmol/L — ABNORMAL LOW (ref 3.5–5.1)
Sodium: 141 mmol/L (ref 135–145)
Total Bilirubin: 1.4 mg/dL — ABNORMAL HIGH (ref 0.0–1.2)
Total Protein: 7.2 g/dL (ref 6.5–8.1)

## 2023-12-09 LAB — URINALYSIS, MICROSCOPIC (REFLEX): Bacteria, UA: NONE SEEN

## 2023-12-09 LAB — GLUCOSE, CAPILLARY
Glucose-Capillary: 51 mg/dL — ABNORMAL LOW (ref 70–99)
Glucose-Capillary: 83 mg/dL (ref 70–99)
Glucose-Capillary: 129 mg/dL — ABNORMAL HIGH (ref 70–99)
Glucose-Capillary: 89 mg/dL (ref 70–99)
Glucose-Capillary: 114 mg/dL — ABNORMAL HIGH (ref 70–99)

## 2023-12-09 LAB — CBG MONITORING, ED
Glucose-Capillary: 63 mg/dL — ABNORMAL LOW (ref 70–99)
Glucose-Capillary: 72 mg/dL (ref 70–99)
Glucose-Capillary: 61 mg/dL — ABNORMAL LOW (ref 70–99)

## 2023-12-09 LAB — URINE DRUG SCREEN, QUALITATIVE (ARMC ONLY)
Amphetamines, Ur Screen: NOT DETECTED
Barbiturates, Ur Screen: NOT DETECTED
Benzodiazepine, Ur Scrn: NOT DETECTED
Cannabinoid 50 Ng, Ur ~~LOC~~: NOT DETECTED
Cocaine Metabolite,Ur ~~LOC~~: NOT DETECTED
MDMA (Ecstasy)Ur Screen: NOT DETECTED
Methadone Scn, Ur: NOT DETECTED
Opiate, Ur Screen: NOT DETECTED
Phencyclidine (PCP) Ur S: NOT DETECTED
Tricyclic, Ur Screen: POSITIVE — AB

## 2023-12-09 LAB — FOLATE: Folate: 21.5 ng/mL (ref >5.9)

## 2023-12-09 LAB — TROPONIN I (HIGH SENSITIVITY): Troponin I (High Sensitivity): 4 ng/L (ref <18)

## 2023-12-09 LAB — HEMOGLOBIN A1C
Hgb A1c MFr Bld: 5.1 % (ref 4.8–5.6)
Mean Plasma Glucose: 99.67 mg/dL

## 2023-12-09 LAB — GAMMA GT: GGT: 177 U/L — ABNORMAL HIGH (ref 7–50)

## 2023-12-09 LAB — TSH: TSH: 1.307 u[IU]/mL (ref 0.350–4.500)

## 2023-12-09 LAB — VITAMIN B12: Vitamin B-12: 1071 pg/mL — ABNORMAL HIGH (ref 180–914)

## 2023-12-09 LAB — APTT: aPTT: 24 s (ref 24–36)

## 2023-12-09 LAB — HEPATITIS PANEL, ACUTE
HCV Ab: NONREACTIVE
Hep A IgM: NONREACTIVE
Hep B C IgM: NONREACTIVE
Hepatitis B Surface Ag: NONREACTIVE

## 2023-12-09 LAB — AMMONIA: Ammonia: 13 umol/L (ref 9–35)

## 2023-12-09 LAB — CK: Total CK: 105 U/L (ref 38–234)

## 2023-12-09 LAB — SEDIMENTATION RATE: Sed Rate: 11 mm/h (ref 0–30)

## 2023-12-09 LAB — ETHANOL: Alcohol, Ethyl (B): <15 mg/dL (ref <15)

## 2023-12-09 LAB — C-REACTIVE PROTEIN: CRP: 0.5 mg/dL (ref <1.0)

## 2023-12-09 MED ORDER — ACETAMINOPHEN 325 MG PO TABS
650.0000 mg | ORAL_TABLET | ORAL | Status: DC | PRN
  Administered 2023-12-10: 650 mg via ORAL
  Filled 2023-12-09: qty 2

## 2023-12-09 MED ORDER — ENOXAPARIN SODIUM 40 MG/0.4ML IJ SOSY
40.0000 mg | PREFILLED_SYRINGE | INTRAMUSCULAR | Status: DC
  Administered 2023-12-09 – 2023-12-10 (×2): 40 mg via SUBCUTANEOUS
  Filled 2023-12-09 (×2): qty 0.4

## 2023-12-09 MED ORDER — SODIUM CHLORIDE 0.9% FLUSH
3.0000 mL | Freq: Two times a day (BID) | INTRAVENOUS | Status: DC
  Administered 2023-12-09 – 2023-12-10 (×4): 10 mL via INTRAVENOUS

## 2023-12-09 MED ORDER — HYDRALAZINE HCL 20 MG/ML IJ SOLN: 5.0000 mg | Freq: Four times a day (QID) | INTRAMUSCULAR | Status: DC | PRN

## 2023-12-09 MED ORDER — INSULIN ASPART 100 UNIT/ML IJ SOLN
0.0000 [IU] | Freq: Three times a day (TID) | INTRAMUSCULAR | Status: DC
  Filled 2023-12-09: qty 1

## 2023-12-09 MED ORDER — DEXTROSE 50 % IV SOLN
25.0000 mL | Freq: Once | INTRAVENOUS | Status: AC
  Administered 2023-12-09: 25 mL via INTRAVENOUS

## 2023-12-09 MED ORDER — STROKE: EARLY STAGES OF RECOVERY BOOK: Freq: Once | Status: DC

## 2023-12-09 MED ORDER — DEXTROSE 50 % IV SOLN
25.0000 mL | Freq: Once | INTRAVENOUS | Status: AC
  Administered 2023-12-09: 25 mL via INTRAVENOUS
  Filled 2023-12-09: qty 50

## 2023-12-09 MED ORDER — POTASSIUM CHLORIDE CRYS ER 20 MEQ PO TBCR
40.0000 meq | EXTENDED_RELEASE_TABLET | Freq: Once | ORAL | Status: AC
  Administered 2023-12-09: 40 meq via ORAL
  Filled 2023-12-09: qty 2

## 2023-12-09 MED ORDER — SENNOSIDES-DOCUSATE SODIUM 8.6-50 MG PO TABS: 1.0000 | ORAL_TABLET | Freq: Every evening | ORAL | Status: DC | PRN

## 2023-12-09 MED ORDER — DEXTROSE-SODIUM CHLORIDE 5-0.9 % IV SOLN: INTRAVENOUS | Status: AC

## 2023-12-09 MED ORDER — ACETAMINOPHEN 160 MG/5ML PO SOLN: 650.0000 mg | ORAL | Status: DC | PRN

## 2023-12-09 MED ORDER — STROKE: EARLY STAGES OF RECOVERY BOOK: Freq: Once | Status: AC

## 2023-12-09 MED ORDER — SODIUM CHLORIDE 0.9% FLUSH: 3.0000 mL | INTRAVENOUS | Status: DC | PRN

## 2023-12-09 MED ORDER — AMITRIPTYLINE HCL 25 MG PO TABS: 50.0000 mg | ORAL_TABLET | Freq: Every evening | ORAL | Status: DC | PRN

## 2023-12-09 MED ORDER — DEXTROSE 50 % IV SOLN
INTRAVENOUS | Status: AC
  Filled 2023-12-09: qty 50

## 2023-12-09 MED ORDER — ACETAMINOPHEN 650 MG RE SUPP: 650.0000 mg | RECTAL | Status: DC | PRN

## 2023-12-09 MED ORDER — IOHEXOL 350 MG/ML SOLN
75.0000 mL | Freq: Once | INTRAVENOUS | Status: AC | PRN
  Administered 2023-12-09: 75 mL via INTRAVENOUS

## 2023-12-09 NOTE — Progress Notes (Signed)
 SLP Cancellation Note  Patient Details Name: Laura Murray MRN: [id number] DOB: 06/02/1966   Cancelled treatment:       Reason Eval/Treat Not Completed: Patient at procedure or test/unavailable (pt finishing w/ OT. Time contraints this PM. Will f/u tomorrow w/ SLE and recommendations.)      Darla Edward, MS, CCC-SLP Speech Language Pathologist Rehab Services; [facility] [place] [phone] (ascom) Laura Murray 12/09/2023, 5:52 PM

## 2023-12-09 NOTE — ED Notes (Signed)
 [facility] 63. On way to CT with pt.

## 2023-12-09 NOTE — ED Triage Notes (Addendum)
 Pt comes with c/o AMS. Pt does have hx of anxiety. Pt tearful in triage. Pt went to work and was called and informed that he uncle had passed. Pt confused and thinks it is her mom.   Husband reports pt went to work this morning and was fine. He states this all started after she was told about the death.   Pt knows where she is but keeps asking how she got here and is she ok. Pt states she doesn't remember going to work.

## 2023-12-09 NOTE — ED Notes (Signed)
 [facility] called for [facility]  Per Dr. Valetta Gaudy , spoke with Andy Bannister

## 2023-12-09 NOTE — Progress Notes (Signed)
   12/09/23 1030  Spiritual Encounters  Type of Visit Initial  Care provided to: Pt and family  Conversation partners present during encounter Nurse  Referral source Code page  Reason for visit Code  OnCall Visit Yes  Spiritual Framework  Presenting Themes Significant life change (Husband, Tylene Galla, said patient found out this  morning that her uncle died and within the hour had a stroke.)  Patient Stress Factors Major life changes (Patient's son shared that patient has suffered signifcant family losses over the last few months.)  Interventions  Spiritual Care Interventions Made Established relationship of care and support;Compassionate presence;Reflective listening;Normalization of emotions;Prayer  Intervention Outcomes  Outcomes Connection to spiritual care;Awareness around self/spiritual resourses;Awareness of support  Spiritual Care Plan  Spiritual Care Issues Still Outstanding Chaplain will continue to follow   Patient has husband, Tylene Galla, and two sons at her bedside. Chaplain asked patient if there was anything she could do and patient asked chaplain to pray. Chaplain prayed for wisdom to find the root cause of what is happening in her body.

## 2023-12-09 NOTE — Progress Notes (Signed)
 Introduced patient and family members to role of Statistician. Patient unable to participate in meaningful conversation at this point. Will re-visit at a later time

## 2023-12-09 NOTE — H&P (Signed)
 History and Physical    Laura Murray NWG:[id number] DOB: 10-19-1965 DOA: 12/09/2023  PCP: Lamon Pillow, MD (Confirm with patient/family/NH records and if not entered, this has to be entered at [facility] point of entry) Patient coming from: Home  I have personally briefly reviewed patient's old medical records in [facility] Link  Chief Complaint: AMS  HPI: Laura Murray is a 58 y.o. female with medical history significant of obesity status post gastric bypass surgery, chronic insomnia, presented with acute confusion.  Patient is confused and unable to provide any history, history provided by patient's husband and 2 sons at bedside.  Family reported that the patient has a chronic insomnia and taking prescription medications for sleep.  Last night patient felt headache and took 1 Tylenol  and went to sleep.  This morning patient woke up and received a phone call from one of the relatives who told her that one of her uncle passed away last night.  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  Family reported that when talking to her patient appeared to be very confused and forgetful and kept asking questions in circle "Why am I here" "What day is today" and thinking of her mother other than uncle has just passed away. Her mother however passed away long time ago.  ED Course: Afebrile, nontachycardic blood pressure 118/88 O2 saturation 100% on room air.  Code stroke was called for appearingly aphasia on arrival.  CT head and CTA negative for acute findings or LVO.  MRI showed punctate stroke of the right cerebellar.  Blood work showed AST 450, ALT 495.  Bilirubin 1.4 INR 0.9 sodium 140 potassium 3.4 BUN 10 creatinine 0.7 glucose 109.  Review of Systems: Unable to perform, patient is confused.  Past Medical History:  Diagnosis Date   Anemia    Insomnia     Obesity    Ovarian cyst    Upper GI bleeding 2015   gastric ulcer    Past Surgical History:  Procedure Laterality Date   BREAST CYST ASPIRATION Left 02/02/2014   Dr Marquita Situ   BREAST CYST ASPIRATION Right 02/2018   CHOLECYSTECTOMY  2009   COLONOSCOPY  2014   GASTRIC BYPASS  2006, 2015   2015 revision   GASTRIC RESECTION  2015   SHOULDER ARTHROSCOPY WITH OPEN ROTATOR CUFF REPAIR Right 03/27/2018   Procedure: SHOULDER ARTHROSCOPY WITH OPEN ROTATOR CUFF REPAIR;  Surgeon: Elner Hahn, MD;  Location: [facility] ORS;  Service: Orthopedics;  Laterality: Right;   SHOULDER CLOSED REDUCTION Right 07/08/2018   Procedure: CLOSED MANIPULATION SHOULDER;  Surgeon: Elner Hahn, MD;  Location: [facility] ORS;  Service: Orthopedics;  Laterality: Right;   STERIOD INJECTION Right 07/08/2018   Procedure: STEROID INJECTION OF RIGHT SHOULDER;  Surgeon: Elner Hahn, MD;  Location: [facility] ORS;  Service: Orthopedics;  Laterality: Right;   TONSILLECTOMY     TUBAL LIGATION     UPPER GI ENDOSCOPY       reports that she has never smoked. She has never used smokeless tobacco. She reports that she does not drink alcohol and does not use drugs.  Allergies  Allergen Reactions   Nsaids Other (See Comments)    Avoid due to gastric bypass     Family History  Problem Relation Age of Onset   Lung cancer Maternal Grandfather    Cancer  Maternal Grandfather    Diabetes Mother    Hypertension Mother    Diabetes Father    Hypertension Father    Crohn's disease Son    Rheum arthritis Son    Asthma Son    Cancer Maternal Grandmother    Breast cancer Maternal Aunt        [phone]   Colon cancer Maternal Uncle      Prior to Admission medications   Medication Sig Start Date End Date Taking? Authorizing Provider  acetaminophen  (TYLENOL ) 500 MG tablet Take 1,000 mg by mouth every 6 (six) hours as needed for moderate pain or headache.    Yes [provider]  amitriptyline  (ELAVIL ) 50 MG tablet Take 50 mg by  mouth at bedtime as needed for sleep.   Yes [provider]  Semaglutide -Weight Management (WEGOVY ) 2.4 MG/0.75ML SOAJ Inject 2.4 mg into the skin once a week. 06/26/23  Yes Lamon Pillow, MD  Vitamin D , Ergocalciferol , (DRISDOL ) 1.25 MG (50000 UNIT) CAPS capsule TAKE 1 CAPSULE ONCE WEEKLY 01/28/23  Yes Lamon Pillow, MD  Calcium-Magnesium-Vitamin D  (CALCIUM 500 PO) Take 500 mg by mouth 2 (two) times daily. Patient not taking: Reported on 12/09/2023    [provider]  CYANOCOBALAMIN  IJ Inject as directed. Patient not taking: Reported on 03/20/2023    [provider]    Physical Exam: Vitals:   12/09/23 1030 12/09/23 1100 12/09/23 1123 12/09/23 1140  BP: (!) 159/75  (!) 153/74   Pulse: 66  69   Resp: 13  16   Temp:    97.6 F (36.4 C)  TempSrc:    Oral  SpO2: 100%  100%   Weight:      Height:  5\' 4"  (1.626 m)      Constitutional: NAD, calm, comfortable Vitals:   12/09/23 1030 12/09/23 1100 12/09/23 1123 12/09/23 1140  BP: (!) 159/75  (!) 153/74   Pulse: 66  69   Resp: 13  16   Temp:    97.6 F (36.4 C)  TempSrc:    Oral  SpO2: 100%  100%   Weight:      Height:  5\' 4"  (1.626 m)     Eyes: PERRL, lids and conjunctivae normal ENMT: Mucous membranes are moist. Posterior pharynx clear of any exudate or lesions.Normal dentition.  Neck: normal, supple, no masses, no thyromegaly Respiratory: clear to auscultation bilaterally, no wheezing, no crackles. Normal respiratory effort. No accessory muscle use.  Cardiovascular: Regular rate and rhythm, no murmurs / rubs / gallops. No extremity edema. 2+ pedal pulses. No carotid bruits.  Abdomen: no tenderness, no masses palpated. No hepatosplenomegaly. Bowel sounds positive.  Musculoskeletal: no clubbing / cyanosis. No joint deformity upper and lower extremities. Good ROM, no contractures. Normal muscle tone.  Skin: no rashes, lesions, ulcers. No induration Neurologic: No facial droops, moving all limbs,  following simple command Psychiatric: Oriented to person and place, confused about time  Labs on Admission: I have personally reviewed following labs and imaging studies  CBC: Recent Labs  Lab 12/09/23 0908  WBC 4.1  NEUTROABS 2.5  HGB 13.8  HCT 40.3  MCV 90.2  PLT 231   Basic Metabolic Panel: Recent Labs  Lab 12/09/23 0908  NA 141  K 3.4*  CL 106  CO2 27  GLUCOSE 109*  BUN 10  CREATININE 0.76  CALCIUM 9.4   GFR: Estimated Creatinine Clearance: 75.7 mL/min (by C-G formula based on SCr of 0.76 mg/dL). Liver Function Tests: Recent Labs  Lab 12/09/23 0908  AST 451*  ALT 495*  ALKPHOS 144*  BILITOT 1.4*  PROT 7.2  ALBUMIN 3.8   No results for input(s): "LIPASE", "AMYLASE" in the last 168 hours. Recent Labs  Lab 12/09/23 1100  AMMONIA 13   Coagulation Profile: Recent Labs  Lab 12/09/23 0908  INR 0.9   Cardiac Enzymes: No results for input(s): "CKTOTAL", "CKMB", "CKMBINDEX", "TROPONINI" in the last 168 hours. BNP (last 3 results) No results for input(s): "PROBNP" in the last 8760 hours. HbA1C: No results for input(s): "HGBA1C" in the last 72 hours. CBG: Recent Labs  Lab 12/09/23 0911 12/09/23 1009 12/09/23 1133  GLUCAP 63* 72 61*   Lipid Profile: No results for input(s): "CHOL", "HDL", "LDLCALC", "TRIG", "CHOLHDL", "LDLDIRECT" in the last 72 hours. Thyroid Function Tests: No results for input(s): "TSH", "T4TOTAL", "FREET4", "T3FREE", "THYROIDAB" in the last 72 hours. Anemia Panel: No results for input(s): "VITAMINB12", "FOLATE", "FERRITIN", "TIBC", "IRON ", "RETICCTPCT" in the last 72 hours. Urine analysis: No results found for: "COLORURINE", "APPEARANCEUR", "LABSPEC", "PHURINE", "GLUCOSEU", "HGBUR", "BILIRUBINUR", "KETONESUR", "PROTEINUR", "UROBILINOGEN", "NITRITE", "LEUKOCYTESUR"  Radiological Exams on Admission: MR BRAIN WO CONTRAST Result Date: 12/09/2023 CLINICAL DATA:  Provided history: Neuro deficit, acute, stroke suspected. Altered mental  status. EXAM: MRI HEAD WITHOUT CONTRAST TECHNIQUE: Multiplanar, multiecho pulse sequences of the brain and surrounding structures were obtained without intravenous contrast. COMPARISON:  Non-contrast head CT and CT angiogram head/neck performed earlier today 12/09/2023. FINDINGS: Brain: Cerebral volume is normal. Punctate acute infarct within the right cerebellar hemisphere (series 5, image 75) (series 7, image 53). No cortical encephalomalacia is identified. No significant cerebral white matter disease. No evidence of an intracranial mass. No extra-axial fluid collection. No midline shift. Vascular: Maintained flow voids within the proximal large arterial vessels. Skull and upper cervical spine: No focal worrisome marrow lesion. Sinuses/Orbits: No mass or acute finding within the imaged orbits. No significant paranasal sinus disease. IMPRESSION: 1. Punctate acute infarct within the right cerebellar hemisphere. 2. Otherwise unremarkable non-contrast MRI appearance of the brain. Electronically Signed   By: Bascom Lily D.O.   On: 12/09/2023 10:14   CT ANGIO HEAD NECK W WO CM (CODE STROKE) Result Date: 12/09/2023 CLINICAL DATA:  Provided history: Neuro deficit, acute, stroke suspected. EXAM: CT ANGIOGRAPHY HEAD AND NECK WITH AND WITHOUT CONTRAST TECHNIQUE: Multidetector CT imaging of the head and neck was performed using the standard protocol during bolus administration of intravenous contrast. Multiplanar CT image reconstructions and MIPs were obtained to evaluate the vascular anatomy. Carotid stenosis measurements (when applicable) are obtained utilizing NASCET criteria, using the distal internal carotid diameter as the denominator. RADIATION DOSE REDUCTION: This exam was performed according to the departmental dose-optimization program which includes automated exposure control, adjustment of the mA and/or kV according to patient size and/or use of iterative reconstruction technique. CONTRAST:  75mL OMNIPAQUE   IOHEXOL  350 MG/ML SOLN COMPARISON:  Noncontrast head CT performed earlier today 12/09/2023. FINDINGS: CTA NECK FINDINGS Upper chest: No consolidation within the imaged lung apices. Other neck: No neck mass or cervical lymphadenopathy. Skeleton: Nonspecific reversal of the expected cervical lordosis. Cervical spondylosis. No acute fracture or aggressive osseous lesion. Aortic arch: Common origin of the innominate and left common carotid arteries. The visualized thoracic aorta is normal in caliber. Streak/beam hardening artifact arising from a dense contrast bolus partially obscures the right subclavian artery. Within this limitation, there is no appreciable hemodynamically significant innominate or proximal subclavian artery stenosis. Right carotid system: CCA and ICA patent within the neck without stenosis. Mild atherosclerotic  plaque at the carotid bifurcation. Tortuosity of the cervical ICA. No evidence of dissection. Left carotid system: CCA and ICA patent within the neck without stenosis. Mild sclerotic plaque about the carotid bifurcation. Tortuosity of the cervical ICA. No evidence of dissection. Vertebral arteries: Codominant and patent within neck without stenosis or significant atherosclerotic disease. No evidence of dissection. Review of the MIP images confirms the above findings CTA HEAD FINDINGS Anterior circulation: The intracranial internal carotid arteries are patent. Nonstenotic atherosclerotic plaque within both vessels. The M1 middle cerebral arteries are patent. No M2 proximal branch occlusion or high-grade proximal stenosis. The anterior cerebral arteries are patent. Hypoplastic right A1 segment. No intracranial aneurysm is identified. Posterior circulation: The intracranial vertebral arteries are patent. The basilar artery is patent. The posterior cerebral arteries are patent. Posterior communicating arteries are present bilaterally. Venous sinuses: Within the limitations of contrast timing, no  convincing thrombus. Anatomic variants: As described. Review of the MIP images confirms the above findings No emergent large vessel occlusion identified. These results were communicated to Dr. Cleone Dad at 9:53 amon 6/2/2025by text page via the [facility] messaging system. IMPRESSION: CTA neck: The common carotid, internal carotid and vertebral arteries are patent within the neck without stenosis. Mild atherosclerotic plaque about the carotid bifurcations, bilaterally. CTA head: No proximal intracranial large vessel occlusion or high-grade proximal arterial stenosis. Electronically Signed   By: Bascom Lily D.O.   On: 12/09/2023 09:55   CT HEAD CODE STROKE WO CONTRAST Result Date: 12/09/2023 CLINICAL DATA:  Code stroke. Provided history: Neuro deficit, acute, stroke suspected. EXAM: CT HEAD WITHOUT CONTRAST TECHNIQUE: Contiguous axial images were obtained from the base of the skull through the vertex without intravenous contrast. RADIATION DOSE REDUCTION: This exam was performed according to the departmental dose-optimization program which includes automated exposure control, adjustment of the mA and/or kV according to patient size and/or use of iterative reconstruction technique. COMPARISON:  Head CT 04/01/2020. FINDINGS: Brain: Cerebral volume is normal. Partially empty sella turcica. There is no acute intracranial hemorrhage. No demarcated cortical infarct. No extra-axial fluid collection. No evidence of an intracranial mass. No midline shift. Vascular: No hyperdense vessel. Skull: No calvarial fracture or aggressive osseous lesion. Sinuses/Orbits: No mass or acute finding within the imaged orbits. No significant paranasal sinus disease at the imaged levels. ASPECTS [facility] Stroke Program Early CT Score) - Ganglionic level infarction (caudate, lentiform nuclei, internal capsule, insula, M1-M3 cortex): 7 - Supraganglionic infarction (M4-M6 cortex): 3 Total score (0-10 with 10 being normal): 10 No evidence of an acute  intracranial abnormality. These results were communicated to Dr. Cleone Dad At 9:29 amon 6/2/2025by text page via the [facility] messaging system. IMPRESSION: No evidence of an acute intracranial abnormality. ASPECTS is 10 Electronically Signed   By: Bascom Lily D.O.   On: 12/09/2023 09:29    EKG: Independently reviewed.  Sinus rhythm, no acute ST changes.  Assessment/Plan Principal Problem:   Encephalopathy Active Problems:   Acute encephalopathy   Stroke (cerebrum) (HCC)  (please populate well all problems here in Problem List. (For example, if patient is on BP meds at home and you resume or decide to hold them, it is a problem that needs to be her. Same for CAD, COPD, HLD and so on)  Acute encephalopathy - Clinically suspect hypertensive encephalopathy, as patient blood pressure SBP more than 210 on arrival in the ED and family reporting patient has no history of high blood pressure in the past. - Other DDx such as possible adjustment disorder needs to be  followed as patient just experienced stressful event in her life.  Metabolic etiology is being worked up including TSH B12 ammonia level.  UA to rule out UTI.  Right cerebellar punctate stroke - Clinical suspect related to HTN emergency - Neurology consultation appreciated - Aspirin -Hold off initiation of statin due to elevated LFTs - Allow permissive hypertension  Acute, probably on chronic transaminitis - No RUQ symptoms, no jaundice - RUQ ultrasound to rule out any hepatobiliary obstruction - Acute hepatitis panel.  Family reported this patient does not take any herbal supplements.  And review of patient's medications do not identify any potential culprit. - Check ESR CRP and trend LFTs.  Hold off statin.  HTN emergency - With acute endorgan damage of acute encephalopathy - Blood pressure appears to be slowly coming down, given there is a concurrent acute stroke, we will only apply as needed hydralazine for BP 200/110  DVT  prophylaxis: Lovenox Code Status: Full code Family Communication: Husband and son bedside Disposition Plan: Expect less than 2 midnight hospital stay Consults called: Neurology Admission status: Telemetry observation   [place] MD [facility] Pager [phone]  12/09/2023, 12:03 PM

## 2023-12-09 NOTE — Consult Note (Signed)
 NEUROLOGY CONSULT NOTE   Date of service: December 09, 2023 Patient Name: Laura Murray MRN:  [id number] DOB:  [birth year] Chief Complaint: "confusion" Requesting Provider: Iver Marker, MD  History of Present Illness  Laura Murray is a 58 y.o. female with hx of previous obesity s/p gastric bypass surgery complicated by iron  deficiency anemia secondary to decreased absorption on regular iron  infusions, insomnia (taking amitriptyline  as needed for sleep), anxiety  She works as a Sales executive and was in her usual state of health this morning.  Her niece called her at 6:30 AM informing her of her uncle passing away.  At 7 AM she called her husband from the parking lot at work.  She was quite upset but able to recall that her husband had died, and said she was going to continue into work and that she was otherwise feeling okay.  No confusion or other concerns at that time.  However at work she was confused crying calling multiple family members getting confused about who had passed away.  She was brought to the ED for further evaluation where a code stroke was activated  LKW: 7 AM Modified rankin score: 0 IV Thrombolysis: No, exam felt to be more consistent with TGA, imaging not consistent with any stroke significant enough to cause disabling symptoms EVT: No, no LVO  NIHSS components Score: Comment  1a Level of Conscious 0[x]  1[]  2[]  3[]      1b LOC Questions 0[]  1[]  2[x]       1c LOC Commands 0[x]  1[]  2[]       2 Best Gaze 0[x]  1[]  2[]       3 Visual 0[x]  1[]  2[]  3[]      4 Facial Palsy 0[x]  1[]  2[]  3[]      5a Motor Arm - left 0[x]  1[]  2[]  3[]  4[]  UN[]    5b Motor Arm - Right 0[x]  1[]  2[]  3[]  4[]  UN[]    6a Motor Leg - Left 0[x]  1[]  2[]  3[]  4[]  UN[]    6b Motor Leg - Right 0[x]  1[]  2[]  3[]  4[]  UN[]    7 Limb Ataxia 0[x]  1[]  2[]  UN[]    Mild tremor at endrange with the right upper extremity, not ataxia  8 Sensory 0[x]  1[]  2[]  UN[]      9 Best Language 0[]  1[x]  2[]  3[]    Unable to name  cactus  10 Dysarthria 0[x]  1[]  2[]  UN[]      11 Extinct. and Inattention 0[x]  1[]  2[]       TOTAL:       ROS  Limited secondary to patient's confusion, obtained from husband as documented in HPI   Past History   Past Medical History:  Diagnosis Date   Anemia    Insomnia    Obesity    Ovarian cyst    Upper GI bleeding 2015   gastric ulcer    Past Surgical History:  Procedure Laterality Date   BREAST CYST ASPIRATION Left 02/02/2014   Dr Marquita Situ   BREAST CYST ASPIRATION Right 02/2018   CHOLECYSTECTOMY  2009   COLONOSCOPY  2014   GASTRIC BYPASS  2006, 2015   2015 revision   GASTRIC RESECTION  2015   SHOULDER ARTHROSCOPY WITH OPEN ROTATOR CUFF REPAIR Right 03/27/2018   Procedure: SHOULDER ARTHROSCOPY WITH OPEN ROTATOR CUFF REPAIR;  Surgeon: Elner Hahn, MD;  Location: [facility];  Service: Orthopedics;  Laterality: Right;   SHOULDER CLOSED REDUCTION Right 07/08/2018   Procedure: CLOSED MANIPULATION SHOULDER;  Surgeon: Elner Hahn, MD;  Location: [facility];  Service: Orthopedics;  Laterality: Right;   STERIOD INJECTION Right 07/08/2018   Procedure: STEROID INJECTION OF RIGHT SHOULDER;  Surgeon: Elner Hahn, MD;  Location: [facility];  Service: Orthopedics;  Laterality: Right;   TONSILLECTOMY     TUBAL LIGATION     UPPER GI ENDOSCOPY      Family History: Family History  Problem Relation Age of Onset   Lung cancer Maternal Grandfather    Cancer Maternal Grandfather    Diabetes Mother    Hypertension Mother    Diabetes Father    Hypertension Father    Crohn's disease Son    Rheum arthritis Son    Asthma Son    Cancer Maternal Grandmother    Breast cancer Maternal Aunt        [phone]   Colon cancer Maternal Uncle     Social History  reports that she has never smoked. She has never used smokeless tobacco. She reports that she does not drink alcohol and does not use drugs.  Allergies  Allergen Reactions   Nsaids Other (See Comments)    Avoid due to gastric bypass      Medications   Current Facility-Administered Medications:    dextrose  50 % solution 25 mL, 25 mL, Intravenous, Once, Quale, Mark, MD  Current Outpatient Medications:    acetaminophen  (TYLENOL ) 500 MG tablet, Take 1,000 mg by mouth every 6 (six) hours as needed for moderate pain or headache. , Disp: , Rfl:    amitriptyline  (ELAVIL ) 50 MG tablet, Take 50 mg by mouth at bedtime as needed for sleep., Disp: , Rfl:    Calcium-Magnesium-Vitamin D  (CALCIUM 500 PO), Take 500 mg by mouth 2 (two) times daily., Disp: , Rfl:    CYANOCOBALAMIN  IJ, Inject as directed. (Patient not taking: Reported on 09/18/2023), Disp: , Rfl:    Semaglutide -Weight Management (WEGOVY ) 2.4 MG/0.75ML SOAJ, Inject 2.4 mg into the skin once a week., Disp: 9 mL, Rfl: 3   Vitamin D , Ergocalciferol , (DRISDOL ) 1.25 MG (50000 UNIT) CAPS capsule, TAKE 1 CAPSULE ONCE WEEKLY, Disp: 12 capsule, Rfl: 3  Vitals   Vitals:   12/09/23 0853 12/09/23 0905  BP: (!) 218/88 (!) 189/78  Pulse: 81 76  Resp: 18 15  Temp: 98.1 F (36.7 C)   SpO2: 100% 100%    There is no height or weight on file to calculate BMI.  Current vital signs: BP (!) 189/78   Pulse 76   Temp 98.1 F (36.7 C)   Resp 15   Wt 70.8 kg   LMP 02/26/2018   SpO2 100%   BMI 26.79 kg/m  Vital signs in last 24 hours: Temp:  [98.1 F (36.7 C)] 98.1 F (36.7 C) (06/02 0853) Pulse Rate:  [76-81] 76 (06/02 0905) Resp:  [15-18] 15 (06/02 0905) BP: (189-218)/(78-88) 189/78 (06/02 0905) SpO2:  [100 %] 100 % (06/02 0905) Weight:  [70.8 kg] 70.8 kg (06/02 1011) Hello hello yeah I manage but  Physical Exam   Constitutional: Appears well-developed and well-nourished.  Psych: Affect appropriate to situation, mildly anxious but cooperative Eyes: No scleral injection.  HENT: No OP obstruction.  Head: Normocephalic.  Cardiovascular: Normal rate and regular rhythm.  Respiratory: Tachypneic but no audible wheezing   Neurologic Examination   Mental  Status: Patient is awake, alert, but disoriented, repeatedly asking if she went to work for example.  Unable to state the month, year, or her age.  Recognizes family members in the room.  Follows commands, names high-frequency objects but struggles a bit for  low-frequency objects such as being unable to name cactus, repeats appropriately Cranial Nerves: II: Visual Fields are full.    III,IV, VI: EOMI without ptosis or diploplia.  V: Facial sensation is symmetric to temperature VII: Facial movement is symmetric.  VIII: hearing is intact to voice X: Uvula elevates symmetrically XI: Shoulder shrug is symmetric. XII: tongue is midline without atrophy or fasciculations.  Motor: 5/5 strength was present in all four extremities (deltoids, biceps, triceps, hip flexion, knee flexion, knee extension, foot dorsi and plantarflexion) Sensory: Sensation is symmetric to light touch and temperature in the arms and legs.  No extinction to double simultaneous stimuli Cerebellar: FNF and HKS are intact bilaterally Gait:  Deferred in acute setting    Labs/Imaging/Neurodiagnostic studies   CBC:  Recent Labs  Lab 12/11/23 0908  WBC 4.1  NEUTROABS 2.5  HGB 13.8  HCT 40.3  MCV 90.2  PLT 231   Basic Metabolic Panel:  Lab Results  Component Value Date   NA 133 (L) 09/11/2023   K 3.8 09/11/2023   CO2 23 09/11/2023   GLUCOSE 101 (H) 09/11/2023   BUN 20 09/11/2023   CREATININE 0.82 09/11/2023   CALCIUM 8.4 (L) 09/11/2023   GFRNONAA >60 09/11/2023   GFRAA 87 11/20/2019   LFTs Lab Results  Component Value Date   ALT 495 (H) 12-11-2023   AST 451 (H) 11-Dec-2023   ALKPHOS 144 (H) 12/11/2023   BILITOT 1.4 (H) 2023-12-11   Lipid Panel:  Lab Results  Component Value Date   LDLCALC 87 11/20/2019   Alcohol Level     Component Value Date/Time   ETH <15 12-11-23 0908   INR  Lab Results  Component Value Date   INR 0.9 Dec 11, 2023   APTT  Lab Results  Component Value Date   APTT 24  Dec 11, 2023   CT Head without contrast(Personally reviewed): No acute intracranial process  CT angio Head and Neck with contrast(Personally reviewed): No LVO  CTA neck:  The common carotid, internal carotid and vertebral arteries are patent within the neck without stenosis. Mild atherosclerotic plaque about the carotid bifurcations, bilaterally.   CTA head:  No proximal intracranial large vessel occlusion or high-grade proximal arterial stenosis.   MRI Brain(Personally reviewed): 1. Punctate acute infarct within the right cerebellar hemisphere. 2. Otherwise unremarkable non-contrast MRI appearance of the brain.  Neurodiagnostics EEG:  Pending  ASSESSMENT   Laura Murray is a 58 y.o. female presenting with sudden onset confusion, hypertension, and elevated liver enzymes after being informed about a death in her family today  Overall clinical impression was not very consistent with acute stroke but given that she was within a potential treatment window with potentially disabling language symptoms, decision was made to pursue advanced imaging for treatment decision  CTA was ordered with perfusion in the emergent setting as initially the last known well had not been personally confirmed by myself, and also to increase sensitivity for a branch vessel occlusion at the origin that may not be well picked up in the absence of prior CTA head and neck.  Unfortunately perfusion was not completed due to technical issue, but given no LVO on head CT decision was made to proceed with MRI without perfusion imaging  MRI brain shows an incidental punctate cerebellar stroke that would not be expected to correlate with her symptoms.  Will not treat with TNK as do not expect this to significantly change her outcome and therefore risk would not outweigh benefit.  However will complete stroke workup  to optimize patient from a risk factor perspective  Impression: Possible transient global  amnesia Possible hypertensive encephalopathy Possible metabolic/toxic encephalopathy secondary to elevated AST, ALT and T. bili, as well as relative hypoglycemia on arrival  RECOMMENDATIONS   Emergent recommendations: - CTA head and neck with perfusion, perfusion subsequently canceled after technical difficulty - MRI brain without contrast stat  Additional recommendations:  # Incidental punctate right cerebellar stroke - Aspirin 81 mg daily if no medical contraindication to this (needs to be ordered) - Stroke labs TSH, ESR/CRP,  HgbA1c, fasting lipid panel, - MRI brain completed as above - CTA completed as above - Frequent neuro checks - Echocardiogram pending - Risk factor modification - Telemetry monitoring - Blood pressure goal  - Permissive hypertension to 220/120 due to acute stroke - PT consult, OT consult, Speech consult,   # Encephalopathy, likely multifactorial (toxic/metabolic secondary to deranged hepatic function, hypoglycemia, hypertension) - Stat EEG - RPR, HIV, B12, folate, UA, UDS - Appreciate workup/management of elevated LFTs and hypoglycemia per ED/primary team - Neurology will follow along in consultation  ______________________________________________________________________    Geraldine Kling MD-PhD [facility] [phone] [facility] coverage for [facility] is from 8 AM to 4 AM in-house and 4 PM to 8 PM by telephone/video. 8 PM to 8 AM emergent questions or overnight urgent questions should be addressed to Teleneurology On-call or Arlin Benes neurohospitalist; contact information can be found on AMION   CRITICAL CARE Performed by: Ronnette Coke   Total critical care time: 40 minutes  Critical care time was exclusive of separately billable procedures and treating other patients.  Critical care was necessary to treat or prevent imminent or life-threatening deterioration, emergent evaluation for consideration of  thrombolytic or thrombectomy.  Critical care was time spent personally by me on the following activities: development of treatment plan with patient and/or surrogate as well as nursing, discussions with consultants, evaluation of patient's response to treatment, examination of patient, obtaining history from patient or surrogate, ordering and performing treatments and interventions, ordering and review of laboratory studies, ordering and review of radiographic studies, pulse oximetry and re-evaluation of patient's condition.

## 2023-12-09 NOTE — Plan of Care (Signed)
  Problem: Education: Goal: Knowledge of disease or condition will improve Outcome: Progressing Goal: Knowledge of secondary prevention will improve (MUST DOCUMENT ALL) Outcome: Progressing Goal: Knowledge of patient specific risk factors will improve (DELETE if not current risk factor) Outcome: Progressing   Problem: Ischemic Stroke/TIA Tissue Perfusion: Goal: Complications of ischemic stroke/TIA will be minimized Outcome: Progressing   Problem: Coping: Goal: Will verbalize positive feelings about self Outcome: Progressing

## 2023-12-09 NOTE — ED Notes (Signed)
 Pt was complaining that "R eye feels heavy". Loetta Ringer, stroke coordinator and this RN went to reevaluate pt. Pt has no facial droop or numbness. Hospitalist and neurologist informed.

## 2023-12-09 NOTE — ED Notes (Signed)
 Acute AMS since 0730, code stroke called by Dr Valetta Gaudy.

## 2023-12-09 NOTE — Progress Notes (Signed)
 Eeg done

## 2023-12-09 NOTE — ED Notes (Signed)
Hospitalist at bedside speaking with pt and family

## 2023-12-09 NOTE — Progress Notes (Signed)
   12/09/23 1400  Spiritual Encounters  Type of Visit Follow up  Care provided to: Pt and family  Referral source Chaplain assessment  Spiritual Care Plan  Spiritual Care Issues Still Outstanding No further spiritual care needs at this time (see row info)   Chaplain met patient in the ED and went to room to see how they were doing. Patient has strong family support and chaplain made sure family knows that chaplain services are available 24/7

## 2023-12-09 NOTE — ED Notes (Signed)
 Back in room with pt after CT and MRI. Neurologist spoke with family at bedside.

## 2023-12-09 NOTE — Progress Notes (Signed)
 PT Cancellation Note  Patient Details Name: Laura Murray MRN: [id number] DOB: 01-14-1966   Cancelled Treatment:    Reason Eval/Treat Not Completed: PT screened, no needs identified, will sign off. Orders received and chart reviewed. Per OT, pt is independent with bed mobility, transfers, ambulation around NSG station and stairs. OT offered pt option for PT to evaluate still but pt declining. Per OT pt with no acute therapy needs with excellent family support. PT to sign off in house.    Marc Senior. Fairly IV, PT, DPT Physical Therapist- [place]  [facility]  12/09/2023, 2:01 PM

## 2023-12-09 NOTE — Procedures (Signed)
 Patient Name: Laura Murray  MRN: [id number]  Epilepsy Attending: Arleene Lack  Referring Physician/Provider: Ronnette Coke, MD  Date: 12/09/2023 Duration: 27.20 mins  Patient history: 58 y.o. female presenting with sudden onset confusion, hypertension, and elevated liver enzymes after being informed about a death in her family today. EEG to evaluate for seizure  Level of alertness: Awake  AEDs during EEG study: None  Technical aspects: This EEG study was done with scalp electrodes positioned according to the 10-20 International system of electrode placement. Electrical activity was reviewed with band pass filter of 1-70Hz , sensitivity of 7 uV/mm, display speed of 40mm/sec with a 60Hz  notched filter applied as appropriate. EEG data were recorded continuously and digitally stored.  Video monitoring was available and reviewed as appropriate.  Description: EEG showed an excessive amount of 12 to 15 Hz beta activity distributed symmetrically and diffusely. Hyperventilation and photic stimulation were not performed.     ABNORMALITY - Excessive beta, generalized  IMPRESSION: This study is within normal limits. The excessive beta activity seen in the background is most likely due to the effect of benzodiazepine and is a benign EEG pattern. No seizures or epileptiform discharges were seen throughout the recording.  A normal interictal EEG does not exclude the diagnosis of epilepsy.   Jordyn Doane O Sherrell Farish

## 2023-12-09 NOTE — ED Provider Notes (Signed)
 [facility] Provider Note    Event Date/Time   First MD Initiated Contact with Patient 12/09/23 0901     (approximate)   History   Altered Mental Status  EM caveat: Patient confused  HPI  Laura Murray is a 58 y.o. female   history of previous obesity cervical radiculopathy   This morning patient's husband reports he was with her she was normal.  He spoke to her at about 6 10 in the morning and she was normal at that time, then also family was with her and she received bad news that a family member had died.  Thereafter she started exhibiting strange symptoms of being confused, disoriented, not able to recall or remember what had been told to her.  She was confused to the point that she thought maybe her mother had died when clearly she was told several times that is her uncle.  Husband reports she continues to seem to have poor memory, and ongoing confusion.      Physical Exam   Triage Vital Signs: ED Triage Vitals  Encounter Vitals Group     BP 12/09/23 0853 (!) 218/88     Systolic BP Percentile --      Diastolic BP Percentile --      Pulse Rate 12/09/23 0853 81     Resp 12/09/23 0853 18     Temp 12/09/23 0853 98.1 F (36.7 C)     Temp src --      SpO2 12/09/23 0853 100 %     Weight --      Height --      Head Circumference --      Peak Flow --      Pain Score 12/09/23 0851 0     Pain Loc --      Pain Education --      Exclude from Growth Chart --     Most recent vital signs: Vitals:   12/09/23 1140 12/09/23 1235  BP:  (!) 163/74  Pulse:  64  Resp:  17  Temp: 97.6 F (36.4 C) 98.3 F (36.8 C)  SpO2:  100%     General: Awake, no distress.  Patient asking repeated questioning as to why she is here.  She recognizes her family holds conversation but seems to have no recollection of preceding events of the day. CV:  Good peripheral perfusion.  Normal tones and rate Resp:  Normal effort.  Clear bilateral Abd:  No distention.   No associated abdominal pain Other:     ED Results / Procedures / Treatments   Labs (all labs ordered are listed, but only abnormal results are displayed) Labs Reviewed  COMPREHENSIVE METABOLIC PANEL WITH GFR - Abnormal; Notable for the following components:      Result Value   Potassium 3.4 (*)    Glucose, Bld 109 (*)    AST 451 (*)    ALT 495 (*)    Alkaline Phosphatase 144 (*)    Total Bilirubin 1.4 (*)    All other components within normal limits  URINALYSIS, ROUTINE W REFLEX MICROSCOPIC - Abnormal; Notable for the following components:   pH 8.5 (*)    Glucose, UA 100 (*)    Leukocytes,Ua SMALL (*)    All other components within normal limits  URINE DRUG SCREEN, QUALITATIVE ([facility] ONLY) - Abnormal; Notable for the following components:   Tricyclic, Ur Screen POSITIVE (*)    All other components within normal limits  GAMMA  GT - Abnormal; Notable for the following components:   GGT 177 (*)    All other components within normal limits  GLUCOSE, CAPILLARY - Abnormal; Notable for the following components:   Glucose-Capillary 51 (*)    All other components within normal limits  GLUCOSE, CAPILLARY - Abnormal; Notable for the following components:   Glucose-Capillary 129 (*)    All other components within normal limits  CBG MONITORING, ED - Abnormal; Notable for the following components:   Glucose-Capillary 63 (*)    All other components within normal limits  CBG MONITORING, ED - Abnormal; Notable for the following components:   Glucose-Capillary 61 (*)    All other components within normal limits  CBC  ETHANOL  PROTIME-INR  APTT  DIFFERENTIAL  CK  TSH  HEMOGLOBIN A1C  SEDIMENTATION RATE  FOLATE  AMMONIA  URINALYSIS, MICROSCOPIC (REFLEX)  GLUCOSE, CAPILLARY  HEPATITIS PANEL, ACUTE  C-REACTIVE PROTEIN  RPR  VITAMIN B12  CBG MONITORING, ED  CBG MONITORING, ED  CBG MONITORING, ED  CBG MONITORING, ED  TROPONIN I (HIGH SENSITIVITY)   Labs demonstrate  transaminitis.  Mild to borderline hypoglycemia, treated with half amp D50.  Cause of transaminitis somewhat unclear, no associated acute abdominal symptomatology.  Query potential hepatotoxic causation etc. or underlying hepatocellular disease  EKG  And interpreted by me at 920 heart rate 80 QRS 80 QTc 430 Normal sinus rhythm, T wave abnormality including biphasic appearance in V2 and V3 can be nonspecific but could also be demonstrative of ischemia.  Awaiting troponin.  Patient complains of no pain iccluding no chest pain   RADIOLOGY  US  ABDOMEN LIMITED RUQ (LIVER/GB) Result Date: 12/09/2023 CLINICAL DATA:  Abdominal pain.  Prior cholecystectomy. EXAM: ULTRASOUND ABDOMEN LIMITED RIGHT UPPER QUADRANT COMPARISON:  CT 03/23/2013 FINDINGS: Gallbladder: Surgically absent Common bile duct: Diameter: Normal, 7 mm Liver: No focal lesion identified. Within normal limits in parenchymal echogenicity. Portal vein is patent on color Doppler imaging with normal direction of blood flow towards the liver. Other: Possible right-sided hydronephrosis, incompletely imaged. IMPRESSION: Cholecystectomy without biliary duct dilatation. Possible right-sided hydronephrosis, incompletely imaged. Depending on clinical concern, this could either be further evaluated with renal ultrasound or CT stone study. Electronically Signed   By: Lore Rode M.D.   On: 12/09/2023 12:57   MR BRAIN WO CONTRAST Result Date: 12/09/2023 CLINICAL DATA:  Provided history: Neuro deficit, acute, stroke suspected. Altered mental status. EXAM: MRI HEAD WITHOUT CONTRAST TECHNIQUE: Multiplanar, multiecho pulse sequences of the brain and surrounding structures were obtained without intravenous contrast. COMPARISON:  Non-contrast head CT and CT angiogram head/neck performed earlier today 12/09/2023. FINDINGS: Brain: Cerebral volume is normal. Punctate acute infarct within the right cerebellar hemisphere (series 5, image 75) (series 7, image 53). No  cortical encephalomalacia is identified. No significant cerebral white matter disease. No evidence of an intracranial mass. No extra-axial fluid collection. No midline shift. Vascular: Maintained flow voids within the proximal large arterial vessels. Skull and upper cervical spine: No focal worrisome marrow lesion. Sinuses/Orbits: No mass or acute finding within the imaged orbits. No significant paranasal sinus disease. IMPRESSION: 1. Punctate acute infarct within the right cerebellar hemisphere. 2. Otherwise unremarkable non-contrast MRI appearance of the brain. Electronically Signed   By: Bascom Lily D.O.   On: 12/09/2023 10:14   CT ANGIO HEAD NECK W WO CM (CODE STROKE) Result Date: 12/09/2023 CLINICAL DATA:  Provided history: Neuro deficit, acute, stroke suspected. EXAM: CT ANGIOGRAPHY HEAD AND NECK WITH AND WITHOUT CONTRAST TECHNIQUE: Multidetector CT imaging  of the head and neck was performed using the standard protocol during bolus administration of intravenous contrast. Multiplanar CT image reconstructions and MIPs were obtained to evaluate the vascular anatomy. Carotid stenosis measurements (when applicable) are obtained utilizing NASCET criteria, using the distal internal carotid diameter as the denominator. RADIATION DOSE REDUCTION: This exam was performed according to the departmental dose-optimization program which includes automated exposure control, adjustment of the mA and/or kV according to patient size and/or use of iterative reconstruction technique. CONTRAST:  75mL OMNIPAQUE  IOHEXOL  350 MG/ML SOLN COMPARISON:  Noncontrast head CT performed earlier today 12/09/2023. FINDINGS: CTA NECK FINDINGS Upper chest: No consolidation within the imaged lung apices. Other neck: No neck mass or cervical lymphadenopathy. Skeleton: Nonspecific reversal of the expected cervical lordosis. Cervical spondylosis. No acute fracture or aggressive osseous lesion. Aortic arch: Common origin of the innominate and left  common carotid arteries. The visualized thoracic aorta is normal in caliber. Streak/beam hardening artifact arising from a dense contrast bolus partially obscures the right subclavian artery. Within this limitation, there is no appreciable hemodynamically significant innominate or proximal subclavian artery stenosis. Right carotid system: CCA and ICA patent within the neck without stenosis. Mild atherosclerotic plaque at the carotid bifurcation. Tortuosity of the cervical ICA. No evidence of dissection. Left carotid system: CCA and ICA patent within the neck without stenosis. Mild sclerotic plaque about the carotid bifurcation. Tortuosity of the cervical ICA. No evidence of dissection. Vertebral arteries: Codominant and patent within neck without stenosis or significant atherosclerotic disease. No evidence of dissection. Review of the MIP images confirms the above findings CTA HEAD FINDINGS Anterior circulation: The intracranial internal carotid arteries are patent. Nonstenotic atherosclerotic plaque within both vessels. The M1 middle cerebral arteries are patent. No M2 proximal branch occlusion or high-grade proximal stenosis. The anterior cerebral arteries are patent. Hypoplastic right A1 segment. No intracranial aneurysm is identified. Posterior circulation: The intracranial vertebral arteries are patent. The basilar artery is patent. The posterior cerebral arteries are patent. Posterior communicating arteries are present bilaterally. Venous sinuses: Within the limitations of contrast timing, no convincing thrombus. Anatomic variants: As described. Review of the MIP images confirms the above findings No emergent large vessel occlusion identified. These results were communicated to Dr. Cleone Dad at 9:53 amon 6/2/2025by text page via the [facility] messaging system. IMPRESSION: CTA neck: The common carotid, internal carotid and vertebral arteries are patent within the neck without stenosis. Mild atherosclerotic plaque  about the carotid bifurcations, bilaterally. CTA head: No proximal intracranial large vessel occlusion or high-grade proximal arterial stenosis. Electronically Signed   By: Bascom Lily D.O.   On: 12/09/2023 09:55   CT HEAD CODE STROKE WO CONTRAST Result Date: 12/09/2023 CLINICAL DATA:  Code stroke. Provided history: Neuro deficit, acute, stroke suspected. EXAM: CT HEAD WITHOUT CONTRAST TECHNIQUE: Contiguous axial images were obtained from the base of the skull through the vertex without intravenous contrast. RADIATION DOSE REDUCTION: This exam was performed according to the departmental dose-optimization program which includes automated exposure control, adjustment of the mA and/or kV according to patient size and/or use of iterative reconstruction technique. COMPARISON:  Head CT 04/01/2020. FINDINGS: Brain: Cerebral volume is normal. Partially empty sella turcica. There is no acute intracranial hemorrhage. No demarcated cortical infarct. No extra-axial fluid collection. No evidence of an intracranial mass. No midline shift. Vascular: No hyperdense vessel. Skull: No calvarial fracture or aggressive osseous lesion. Sinuses/Orbits: No mass or acute finding within the imaged orbits. No significant paranasal sinus disease at the imaged levels. ASPECTS [facility] Stroke Program  Early CT Score) - Ganglionic level infarction (caudate, lentiform nuclei, internal capsule, insula, M1-M3 cortex): 7 - Supraganglionic infarction (M4-M6 cortex): 3 Total score (0-10 with 10 being normal): 10 No evidence of an acute intracranial abnormality. These results were communicated to Dr. Cleone Dad At 9:29 amon 6/2/2025by text page via the [facility] messaging system. IMPRESSION: No evidence of an acute intracranial abnormality. ASPECTS is 10 Electronically Signed   By: Bascom Lily D.O.   On: 12/09/2023 09:29      PROCEDURES:  Critical Care performed: Yes, see critical care procedure note(s)  CRITICAL CARE Performed by: Iver Marker   Total critical care time: 30 minutes  Critical care time was exclusive of separately billable procedures and treating other patients.  Critical care was necessary to treat or prevent imminent or life-threatening deterioration.  Critical care was time spent personally by me on the following activities: development of treatment plan with patient and/or surrogate as well as nursing, discussions with consultants, evaluation of patient's response to treatment, examination of patient, obtaining history from patient or surrogate, ordering and performing treatments and interventions, ordering and review of laboratory studies, ordering and review of radiographic studies, pulse oximetry and re-evaluation of patient's condition.   Procedures   MEDICATIONS ORDERED IN ED: Medications   stroke: early stages of recovery book (has no administration in time range)  amitriptyline  (ELAVIL ) tablet 50 mg (has no administration in time range)  acetaminophen  (TYLENOL ) tablet 650 mg (has no administration in time range)    Or  acetaminophen  (TYLENOL ) 160 MG/5ML solution 650 mg (has no administration in time range)    Or  acetaminophen  (TYLENOL ) suppository 650 mg (has no administration in time range)  senna-docusate (Senokot-S) tablet 1 tablet (has no administration in time range)  sodium chloride  flush (NS) 0.9 % injection 3-10 mL (10 mLs Intravenous Given 12/09/23 1136)  sodium chloride  flush (NS) 0.9 % injection 3-10 mL (has no administration in time range)  enoxaparin (LOVENOX) injection 40 mg (has no administration in time range)  hydrALAZINE (APRESOLINE) injection 5 mg (has no administration in time range)  potassium chloride  SA (KLOR-CON  M) CR tablet 40 mEq (has no administration in time range)  dextrose  5 %-0.9 % sodium chloride  infusion ( Intravenous New Bag/Given 12/09/23 1305)  dextrose  50 % solution (  Canceled Entry 12/09/23 1307)  dextrose  50 % solution 25 mL (25 mLs Intravenous Given 12/09/23  0932)  iohexol  (OMNIPAQUE ) 350 MG/ML injection 75 mL (75 mLs Intravenous Contrast Given 12/09/23 0938)  dextrose  50 % solution 25 mL (25 mLs Intravenous Given 12/09/23 1135)     IMPRESSION / MDM / ASSESSMENT AND PLAN / ED COURSE  I reviewed the triage vital signs and the nursing notes.                              Differential diagnosis includes, but is not limited to, possible panic type reaction, amnestic event, central neurologic tract abnormality such as intracranial hemorrhage stroke ischemic event, reversible encephalopathy, hypertensive encephalopathy hypoglycemic event metabolic abnormality etc. are considered.  Given patient's presentation with the last known well about 7:30 AM code stroke activated.  Appreciate consult from neurology  Hypoglycemia with a glucose of 63, this may or may not be related to her symptoms but treated with half amp D50 to evaluate for change or improvement.  Advanced imaging being conducted by our neurology team.    Patient's presentation is most consistent with acute presentation with  potential threat to life or bodily function.      Clinical Course as of 12/09/23 1551  Mon Dec 09, 2023  1308 Code stroke activated. LKW 730am. Acute loss of memory, NIH = 2 (age, orientation) [MQ]    Clinical Course User Index [MQ] Iver Marker, MD   Patient accepted admission to the hospital service by Dr. Jeane Miguel.  Appreciate consultation by neurology who feels most likely episode of transient amnesia, but also aware of small finding of potential acute infarct on MRI though advising this is more likely incidental.    FINAL CLINICAL IMPRESSION(S) / ED DIAGNOSES   Final diagnoses:  Amnestic state  Abnormal transaminases  Hypoglycemia     Rx / DC Orders   ED Discharge Orders     None        Note:  This document was prepared using Dragon voice recognition software and may include unintentional dictation errors.   Iver Marker, MD 12/09/23 [phone]

## 2023-12-09 NOTE — Progress Notes (Signed)
 Telestroke RN Note:  Elert [id number] - pt in CT at this time Dr. Cleone Dad paged [phone] Dr. Cleone Dad in CT [phone], NIHSS started at this time Stroke cart remained in CT with Dr. Cleone Dad while pt transported to MRI [phone]  Dr. Cleone Dad and stroke cart to MRI at 0946  Pt leaving MRI 0959  LNW 0700 mRS 0 acute confusion

## 2023-12-09 NOTE — Evaluation (Signed)
 Occupational Therapy Evaluation Patient Details Name: Laura Murray MRN: [id number] DOB: [birth year] Today's Date: 12/09/2023   History of Present Illness   NIEVES CHAPA is a 58 y.o. female with medical history significant of obesity status post gastric bypass surgery, chronic insomnia, presented with acute confusion, hypertension, and elevated liver enzymes after being informed about a death in her family today. Brain MRI revealing "Incidental punctate right cerebellar stroke"   Clinical Impressions Ms Monger was seen for OT evaluation this date. Prior to hospital admission, pt was IND including working full time. Pt lives with spouse in home c 3 STE. Pt demonstrates near baseline functional independence for mobility and ADLs including navigating x4 steps. Pt oriented to self, time, location, not oriented to situation. Step by step cues to sequence FNF, improved sequencing of coordination tasks with visual demonstration. Provided stroke education, d/c recs, and OT role. All education complete, no skilled acute OT needs identified, will sign off. Upon hospital discharge, recommend no OT follow up.     Functional Status Assessment   Patient has had a recent decline in their functional status and demonstrates the ability to make significant improvements in function in a reasonable and predictable amount of time.     Equipment Recommendations   None recommended by OT      Precautions/Restrictions   Precautions Precautions: None Recall of Precautions/Restrictions: Intact Restrictions Weight Bearing Restrictions Per Provider Order: No     Mobility Bed Mobility Overal bed mobility: Independent                  Transfers Overall transfer level: Independent                        Balance Overall balance assessment: No apparent balance deficits (not formally assessed)                                         ADL either performed or  assessed with clinical judgement   ADL Overall ADL's : Independent                                             Vision         Perception         Praxis         Pertinent Vitals/Pain Pain Assessment Pain Assessment: No/denies pain     Extremity/Trunk Assessment Upper Extremity Assessment Upper Extremity Assessment: Right hand dominant;Overall WFL for tasks assessed (R bicep/tricep testing limited by IV)   Lower Extremity Assessment Lower Extremity Assessment: Overall WFL for tasks assessed       Communication Communication Communication: No apparent difficulties   Cognition Arousal: Alert Behavior During Therapy: WFL for tasks assessed/performed Cognition: Cognition impaired             OT - Cognition Comments: oreinted to self/location/time, not oriented to situation.                 Following commands: Intact       Cueing  General Comments   Cueing Techniques: Gestural cues      Exercises     Shoulder Instructions      Home Living Family/patient expects to be discharged to:: Private residence Living Arrangements: Spouse/significant other  Available Help at Discharge: Family;Available 24 hours/day Type of Home: House Home Access: Stairs to enter [employer] of Steps: 3 Entrance Stairs-Rails: None Home Layout: One level               Home Equipment: None          Prior Functioning/Environment Prior Level of Function : Independent/Modified Independent;Working/employed;Driving               ADLs Comments: works as Hotel manager Problem List: Decreased safety awareness        OT Goals(Current goals can be found in the care plan section)   Acute Rehab OT Goals Patient Stated Goal: to return to work OT Goal Formulation: With patient/family Time For Goal Achievement: 12/09/23 Potential to Achieve Goals: Good   AM-PAC OT "6 Clicks" Daily Activity     Outcome Measure Help  from another person eating meals?: None Help from another person taking care of personal grooming?: None Help from another person toileting, which includes using toliet, bedpan, or urinal?: None Help from another person bathing (including washing, rinsing, drying)?: A Little Help from another person to put on and taking off regular upper body clothing?: None Help from another person to put on and taking off regular lower body clothing?: None 6 Click Score: 23   End of Session    Activity Tolerance: Patient tolerated treatment well Patient left: in bed;with call bell/phone within reach;with family/visitor present  OT Visit Diagnosis: Other abnormalities of gait and mobility (R26.89);Muscle weakness (generalized) (M62.81)                Time: 5284-1324 OT Time Calculation (min): 16 min Charges:  OT General Charges $OT Visit: 1 Visit OT Evaluation $OT Eval Low Complexity: 1 Low  Gordan Latina, M.S. OTR/L  12/09/23, 2:17 PM  ascom [phone]

## 2023-12-09 NOTE — Code Documentation (Signed)
 Stroke Response Nurse Documentation Code Documentation  Laura Murray is a 58 y.o. female arriving to [facility] via [employer] on 12/09/2023 with past medical hx of obesity, s/p gastric bypass surgery, iron  deficiency anemia. On No antithrombotic. Code stroke was activated by ED.   Patient from work parking lot where she was LKW at 0700 and now complaining of confusion. Patient was in her normal state this morning. Around 0630, she received a phone call from a family member informing her of a death in the family. At 0700, she called her husband- he noticed that she was confused and picked her up to bring her to the hospital.    Stroke team at the bedside on patient arrival. Labs drawn and patient cleared for CT by Dr. Valetta Gaudy. Patient to CT with team. NIHSS 3, see documentation for details and code stroke times. Patient with disoriented and Expressive aphasia  on exam. The following imaging was completed:  CT Head, CTA, and MRI. Patient is not a candidate for IV Thrombolytic due to stroke not suspected, per MD. Patient is not a candidate for IR due to no LVO, per MD.   Care Plan: every 2 hour NIHSS/ VS swallow screen per order.   Process Delays Noted: none  Bedside handoff with ED RN Ivan Marion.    Gareld June  Stroke Response RN

## 2023-12-10 ENCOUNTER — Inpatient Hospital Stay

## 2023-12-10 ENCOUNTER — Inpatient Hospital Stay: Admit: 2023-12-10 | Discharge: 2023-12-10 | Disposition: A | Discharge status: Home / Self Care | Attending: Neurology

## 2023-12-10 DIAGNOSIS — E162 Hypoglycemia, unspecified: Secondary | ICD-10-CM | POA: Diagnosis not present

## 2023-12-10 DIAGNOSIS — F4320 Adjustment disorder, unspecified: Secondary | ICD-10-CM

## 2023-12-10 DIAGNOSIS — F5104 Psychophysiologic insomnia: Secondary | ICD-10-CM

## 2023-12-10 DIAGNOSIS — I639 Cerebral infarction, unspecified: Secondary | ICD-10-CM

## 2023-12-10 DIAGNOSIS — R7401 Elevation of levels of liver transaminase levels: Secondary | ICD-10-CM

## 2023-12-10 DIAGNOSIS — I63541 Cerebral infarction due to unspecified occlusion or stenosis of right cerebellar artery: Principal | ICD-10-CM

## 2023-12-10 DIAGNOSIS — I739 Peripheral vascular disease, unspecified: Secondary | ICD-10-CM | POA: Diagnosis not present

## 2023-12-10 DIAGNOSIS — G928 Other toxic encephalopathy: Secondary | ICD-10-CM

## 2023-12-10 LAB — COMPREHENSIVE METABOLIC PANEL WITH GFR
ALT: 396 U/L — ABNORMAL HIGH (ref 0–44)
AST: 225 U/L — ABNORMAL HIGH (ref 15–41)
Albumin: 3.3 g/dL — ABNORMAL LOW (ref 3.5–5.0)
Alkaline Phosphatase: 134 U/L — ABNORMAL HIGH (ref 38–126)
Anion gap: 7 (ref 5–15)
BUN: 10 mg/dL (ref 6–20)
CO2: 26 mmol/L (ref 22–32)
Calcium: 9 mg/dL (ref 8.9–10.3)
Chloride: 107 mmol/L (ref 98–111)
Creatinine, Ser: 0.64 mg/dL (ref 0.44–1.00)
GFR, Estimated: >60 mL/min (ref >60)
Glucose, Bld: 105 mg/dL — ABNORMAL HIGH (ref 70–99)
Potassium: 3.9 mmol/L (ref 3.5–5.1)
Sodium: 140 mmol/L (ref 135–145)
Total Bilirubin: 0.5 mg/dL (ref 0.0–1.2)
Total Protein: 6.5 g/dL (ref 6.5–8.1)

## 2023-12-10 LAB — LIPID PANEL
Cholesterol: 160 mg/dL (ref 0–200)
HDL: 72 mg/dL (ref >40)
LDL Cholesterol: 81 mg/dL (ref 0–99)
Total CHOL/HDL Ratio: 2.2 ratio
Triglycerides: 34 mg/dL (ref <150)
VLDL: 7 mg/dL (ref 0–40)

## 2023-12-10 LAB — RPR: RPR Ser Ql: NONREACTIVE

## 2023-12-10 LAB — GLUCOSE, CAPILLARY
Glucose-Capillary: 90 mg/dL (ref 70–99)
Glucose-Capillary: 73 mg/dL (ref 70–99)
Glucose-Capillary: 76 mg/dL (ref 70–99)
Glucose-Capillary: 114 mg/dL — ABNORMAL HIGH (ref 70–99)

## 2023-12-10 LAB — HIV ANTIBODY (ROUTINE TESTING W REFLEX): HIV Screen 4th Generation wRfx: NONREACTIVE

## 2023-12-10 NOTE — Progress Notes (Addendum)
 NEUROLOGY CONSULT FOLLOW UP NOTE   Date of service: December 10, 2023 Patient Name: Laura Murray MRN:  [id number] DOB:  08/10/1965  Interval Hx/subjective   - Feels back to baseline, but does not recall the events of yesterday until speaking with the chaplain later in the day, is able to recall that she was told she had a stroke - Husband, son, sister, niece and friend at bedside  Vitals   Vitals:   12/09/23 2013 12/10/23 0031 12/10/23 0616 12/10/23 0753  BP: 134/68 126/67 119/63 (!) 149/69  Pulse: 64 65 66 62  Resp: 18 18 18 18   Temp: 98.1 F (36.7 C) 98.2 F (36.8 C) 98.3 F (36.8 C) 98 F (36.7 C)  TempSrc:      SpO2: 100% 100% 97% 100%  Weight:      Height:         Body mass index is 26.79 kg/m.  Physical Exam   Constitutional: Appears well-developed and well-nourished.  Psych: Affect appropriate to situation, calm and cooperative Eyes: No scleral injection HENT: No oropharyngeal obstruction.  MSK: no joint deformities.  Cardiovascular: Normal rate and regular rhythm. Perfusing extremities well Respiratory: Effort normal, non-labored breathing GI: Soft.  No distension. There is no tenderness.  Skin: Warm dry and intact visible skin  Neurologic Examination   Mental Status: Patient is awake, alert, oriented to person, place, month, year, and situation. Patient is able to give a clear and coherent history. No signs of aphasia or neglect in casual conversation Cranial Nerves: Tracks examiner bilaterally.  Hearing intact to voice.  Face symmetric. Motor/gait: Rises easily from the bed.  Using bilateral upper extremities equally spontaneously.  Normal casual gait.  Able to rise on heels and toes.  Able to tandem walk Cerebellar: Finger-to-nose intact bilaterally   Medications  Current Facility-Administered Medications:     stroke: early stages of recovery book, , Does not apply, Once, Rhilynn Preyer L, MD   acetaminophen  (TYLENOL ) tablet 650 mg, 650 mg,  Oral, Q4H PRN **OR** acetaminophen  (TYLENOL ) 160 MG/5ML solution 650 mg, 650 mg, Per Tube, Q4H PRN **OR** acetaminophen  (TYLENOL ) suppository 650 mg, 650 mg, Rectal, Q4H PRN, Antoniette Batty T, MD   amitriptyline  (ELAVIL ) tablet 50 mg, 50 mg, Oral, QHS PRN, Zhang, Ping T, MD   enoxaparin (LOVENOX) injection 40 mg, 40 mg, Subcutaneous, Q24H, Zhang, Ping T, MD, 40 mg at 12/09/23 2149   hydrALAZINE (APRESOLINE) injection 5 mg, 5 mg, Intravenous, Q6H PRN, Antoniette Batty T, MD   insulin aspart (novoLOG) injection 0-9 Units, 0-9 Units, Subcutaneous, TID WC, Zhang, Ping T, MD   senna-docusate (Senokot-S) tablet 1 tablet, 1 tablet, Oral, QHS PRN, Antoniette Batty T, MD   sodium chloride  flush (NS) 0.9 % injection 3-10 mL, 3-10 mL, Intravenous, Q12H, Jeane Miguel, Ping T, MD, 10 mL at 12/09/23 2150   sodium chloride  flush (NS) 0.9 % injection 3-10 mL, 3-10 mL, Intravenous, PRN, [place], MD  Labs and Diagnostic Imaging   CBC:  Recent Labs  Lab 12/09/23 0908  WBC 4.1  NEUTROABS 2.5  HGB 13.8  HCT 40.3  MCV 90.2  PLT 231    Basic Metabolic Panel:  Lab Results  Component Value Date   NA 140 12/10/2023   K 3.9 12/10/2023   CO2 26 12/10/2023   GLUCOSE 105 (H) 12/10/2023   BUN 10 12/10/2023   CREATININE 0.64 12/10/2023   CALCIUM 9.0 12/10/2023   GFRNONAA >60 12/10/2023   GFRAA 87 11/20/2019   Lipid Panel:  Lab Results  Component Value Date   CHOL 160 12/10/2023   HDL 72 12/10/2023   LDLCALC 81 12/10/2023   TRIG 34 12/10/2023   CHOLHDL 2.2 12/10/2023     HgbA1c:  Lab Results  Component Value Date   HGBA1C 5.1 12/09/2023   Urine Drug Screen:     Component Value Date/Time   LABOPIA NONE DETECTED 12/09/2023 1042   COCAINSCRNUR NONE DETECTED 12/09/2023 1042   LABBENZ NONE DETECTED 12/09/2023 1042   AMPHETMU NONE DETECTED 12/09/2023 1042   THCU NONE DETECTED 12/09/2023 1042   LABBARB NONE DETECTED 12/09/2023 1042    Alcohol Level     Component Value Date/Time   ETH <15 12/09/2023 0908    INR  Lab Results  Component Value Date   INR 0.9 12/09/2023   APTT  Lab Results  Component Value Date   APTT 24 12/09/2023   AED levels: No results found for: "PHENYTOIN", "ZONISAMIDE", "LAMOTRIGINE", "LEVETIRACETA"  CT Head without contrast(Personally reviewed): No acute intracranial process   CT angio Head and Neck with contrast(Personally reviewed): No LVO  CTA neck:  The common carotid, internal carotid and vertebral arteries are patent within the neck without stenosis. Mild atherosclerotic plaque about the carotid bifurcations, bilaterally.   CTA head:  No proximal intracranial large vessel occlusion or high-grade proximal arterial stenosis.     MRI Brain(Personally reviewed): 1. Punctate acute infarct within the right cerebellar hemisphere. 2. Otherwise unremarkable non-contrast MRI appearance of the brain.   Neurodiagnostics EEG:  - Excessive beta, generalized IMPRESSION: This study is within normal limits. The excessive beta activity seen in the background is most likely due to the effect of benzodiazepine [or other toxic/metabolic ingestion] and is a benign EEG pattern. No seizures or epileptiform discharges were seen throughout the recording. A normal interictal EEG does not exclude the diagnosis of epilepsy.  Assessment  Laura Murray is a 58 y.o. female presenting with sudden onset confusion, hypertension, and elevated liver enzymes after being informed about a death in her family today (she does not seem to recall being informed about this death)   MRI brain shows an incidental punctate cerebellar stroke that would not be expected to correlate with her symptoms.  Did not treat with TNK as do not expect this to significantly change her outcome and therefore risk would not outweigh benefit.  However will complete stroke workup to optimize patient from a risk factor perspective, at this time only echocardiogram is pending   Impression: Possible  transient global amnesia given triggered by stress Possible hypertensive encephalopathy Possible metabolic/toxic encephalopathy secondary to elevated AST, ALT and T. bili, as well as relative hypoglycemia on arrival   RECOMMENDATIONS    Emergent recommendations: - CTA head and neck with perfusion, perfusion subsequently canceled after technical difficulty - MRI brain without contrast stat   Additional recommendations:   # Incidental punctate right cerebellar stroke, etiology possibly small vessel disease given size and location - Aspirin 81 mg daily recommended but patient would prefer to hold off until discussion with her GI team given history of gastric ulcers, which is reasonable - Stroke labs TSH 1.307, ESR/CRP within normal limits,  HgbA1c meeting goal at 5.1%,  - LDL 81, will hold off on statin for now due to acute liver injury, and recheck on an outpatient basis and consider initiation if LDL is greater than 70 - Frequent neuro checks per protocol - Echocardiogram pending, if there is left atrial enlargement and no other indication for anticoagulation found during  admission would recommend Zio patch on discharge - Risk factor modification - Telemetry monitoring okay to discontinue if ECHO is reassuring - Blood pressure goal, gradual normotension  # Encephalopathy, likely multifactorial (toxic/metabolic secondary to deranged hepatic function, hypoglycemia, hypertension, acute grief reaction) - EEG reassuring (consistent with toxic/metabolic insult) - RPR negative, HIV negative, B12 (1071 consistent with supplementation), folate 21.5, ammonia 13, UA reassuring, UDS positive for tricyclics consistent with her use of amitriptyline  - Appreciate workup/management of elevated LFTs and hypoglycemia per ED/primary team, notably LFTs do appear to be improving, consider role of Wegovy  and/or amitriptyline  - Neurology will follow-up echocardiogram but otherwise will sign off at this time.   Please reach out if additional questions or concerns arise  # Chronic insomnia - Sleep hygiene discussed with patient and family and this should be followed up further outpatient ______________________________________________________________________   Baldwin Levee MD-PhD [facility] [phone] Available 7 AM to 7 PM, outside these hours please contact Neurologist on call listed on AMION

## 2023-12-10 NOTE — Progress Notes (Signed)
 PROGRESS NOTE   HPI was taken from Dr. Jeane Miguel: Laura Murray is a 58 y.o. female with medical history significant of obesity status post gastric bypass surgery, chronic insomnia, presented with acute confusion.   Patient is confused and unable to provide any history, history provided by patient's husband and 2 sons at bedside.  Family reported that the patient has a chronic insomnia and taking prescription medications for sleep.  Last night patient felt headache and took 1 Tylenol  and went to sleep.  This morning patient woke up and received a phone call from one of the relatives who told her that one of her uncle passed away last night.  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  Family reported that when talking to her patient appeared to be very confused and forgetful and kept asking questions in circle "Why am I here" "What day is today" and thinking of her mother other than uncle has just passed away. Her mother however passed away long time ago.   ED Course: Afebrile, nontachycardic blood pressure 118/88 O2 saturation 100% on room air.  Code stroke was called for appearingly aphasia on arrival.  CT head and CTA negative for acute findings or LVO.  MRI showed punctate stroke of the right cerebellar.  Blood work showed AST 450, ALT 495.  Bilirubin 1.4 INR 0.9 sodium 140 potassium 3.4 BUN 10 creatinine 0.7 glucose 109.   Laura Murray  UJW:[id number] DOB: 01-27-1966 DOA: 12/09/2023 PCP: Lamon Pillow, MD   Assessment & Plan:   Principal Problem:   Encephalopathy Active Problems:   Acute encephalopathy   Stroke (cerebrum) (HCC)  Assessment and Plan:   CVA: of right cerebellum as per MRI. Allow permissive HTN. Echo ordered. Continue on aspirin. Continue on tele. Neuro following and recs apprec. PT/OT consulted   Acute encephalopathy: likely  secondary to CVA vs infection. Re-orient prn. UA is unremarkable. Urine drug screen is positive for TCAs. Mental status improved    Acute transaminitis: etiology unclear. US  shows cholecystectomy w/o biliary duct dilatation. Hepatitis panel is neg. Possible home wegovy  use so will hold while inpatient. No statin currently secondary to acute transaminitis.   HTN emergency: likely secondary to CVA. Allow permissive HTN  ? Hydronephrosis: renal US  ordered   DVT prophylaxis: lovenox  Code Status: full  Family Communication: discussed pt's care w/ pt's family at bedside and answered their questions Disposition Plan: likely d/c back home   Level of care: Telemetry Medical  Status is: Inpatient Remains inpatient appropriate because: severity of illness    Consultants:  Neuro   Procedures:   Antimicrobials:    Subjective: Pt c/o headache   Objective: Vitals:   12/09/23 2013 12/10/23 0031 12/10/23 0616 12/10/23 0753  BP: 134/68 126/67 119/63 (!) 149/69  Pulse: 64 65 66 62  Resp: 18 18 18 18   Temp: 98.1 F (36.7 C) 98.2 F (36.8 C) 98.3 F (36.8 C) 98 F (36.7 C)  TempSrc:      SpO2: 100% 100% 97% 100%  Weight:      Height:        Intake/Output Summary (Last 24 hours) at 12/10/2023 0812 Last data filed at 12/09/2023 1049 Gross per 24 hour  Intake 50 ml  Output --  Net 50 ml   Filed Weights   12/09/23 1011  Weight: 70.8 kg  Examination:  General exam: Appears calm and comfortable  Respiratory system: Clear to auscultation. Respiratory effort normal. Cardiovascular system: S1 & S2+. No rubs, gallops or clicks.  Gastrointestinal system: Abdomen is nondistended, soft and nontender.  Normal bowel sounds heard. Central nervous system: Alert and oriented. Moves all extremities  Psychiatry: Judgement and insight appears improved. Flat mood and affect     Data Reviewed: I have personally reviewed following labs and imaging studies  CBC: Recent Labs  Lab  12/09/23 0908  WBC 4.1  NEUTROABS 2.5  HGB 13.8  HCT 40.3  MCV 90.2  PLT 231   Basic Metabolic Panel: Recent Labs  Lab 12/09/23 0908 12/10/23 0318  NA 141 140  K 3.4* 3.9  CL 106 107  CO2 27 26  GLUCOSE 109* 105*  BUN 10 10  CREATININE 0.76 0.64  CALCIUM 9.4 9.0   GFR: Estimated Creatinine Clearance: 75.7 mL/min (by C-G formula based on SCr of 0.64 mg/dL). Liver Function Tests: Recent Labs  Lab 12/09/23 0908 12/10/23 0318  AST 451* 225*  ALT 495* 396*  ALKPHOS 144* 134*  BILITOT 1.4* 0.5  PROT 7.2 6.5  ALBUMIN 3.8 3.3*   No results for input(s): "LIPASE", "AMYLASE" in the last 168 hours. Recent Labs  Lab 12/09/23 1100  AMMONIA 13   Coagulation Profile: Recent Labs  Lab 12/09/23 0908  INR 0.9   Cardiac Enzymes: Recent Labs  Lab 12/09/23 1042  CKTOTAL 105   BNP (last 3 results) No results for input(s): "PROBNP" in the last 8760 hours. HbA1C: Recent Labs    12/09/23 1042  HGBA1C 5.1   CBG: Recent Labs  Lab 12/09/23 1311 12/09/23 1454 12/09/23 1718 12/09/23 2018 12/10/23 0614  GLUCAP 83 129* 89 114* 90   Lipid Profile: Recent Labs    12/10/23 0318  CHOL 160  HDL 72  LDLCALC 81  TRIG 34  CHOLHDL 2.2   Thyroid Function Tests: Recent Labs    12/09/23 1042  TSH 1.307   Anemia Panel: Recent Labs    12/09/23 1042  VITAMINB12 1,071*  FOLATE 21.5   Sepsis Labs: No results for input(s): "PROCALCITON", "LATICACIDVEN" in the last 168 hours.  No results found for this or any previous visit (from the past 240 hours).       Radiology Studies: EEG adult Result Date: 12/09/2023 Arleene Lack, MD     12/09/2023  9:02 PM Patient Name: Laura Murray MRN: [id number] Epilepsy Attending: Arleene Lack Referring Physician/Provider: Ronnette Coke, MD Date: 12/09/2023 Duration: 27.20 mins Patient history: 58 y.o. female presenting with sudden onset confusion, hypertension, and elevated liver enzymes after being informed about a  death in her family today. EEG to evaluate for seizure Level of alertness: Awake AEDs during EEG study: None Technical aspects: This EEG study was done with scalp electrodes positioned according to the 10-20 International system of electrode placement. Electrical activity was reviewed with band pass filter of 1-70Hz , sensitivity of 7 uV/mm, display speed of 78mm/sec with a 60Hz  notched filter applied as appropriate. EEG data were recorded continuously and digitally stored.  Video monitoring was available and reviewed as appropriate. Description: EEG showed an excessive amount of 12 to 15 Hz beta activity distributed symmetrically and diffusely. Hyperventilation and photic stimulation were not performed.   ABNORMALITY - Excessive beta, generalized IMPRESSION: This study is within normal limits. The excessive beta activity seen in the background is most likely due to the effect of benzodiazepine and is a benign EEG pattern. No  seizures or epileptiform discharges were seen throughout the recording. A normal interictal EEG does not exclude the diagnosis of epilepsy. Priyanka O Yadav   US  ABDOMEN LIMITED RUQ (LIVER/GB) Result Date: 12/09/2023 CLINICAL DATA:  Abdominal pain.  Prior cholecystectomy. EXAM: ULTRASOUND ABDOMEN LIMITED RIGHT UPPER QUADRANT COMPARISON:  CT 03/23/2013 FINDINGS: Gallbladder: Surgically absent Common bile duct: Diameter: Normal, 7 mm Liver: No focal lesion identified. Within normal limits in parenchymal echogenicity. Portal vein is patent on color Doppler imaging with normal direction of blood flow towards the liver. Other: Possible right-sided hydronephrosis, incompletely imaged. IMPRESSION: Cholecystectomy without biliary duct dilatation. Possible right-sided hydronephrosis, incompletely imaged. Depending on clinical concern, this could either be further evaluated with renal ultrasound or CT stone study. Electronically Signed   By: Lore Rode M.D.   On: 12/09/2023 12:57   MR BRAIN WO  CONTRAST Result Date: 12/09/2023 CLINICAL DATA:  Provided history: Neuro deficit, acute, stroke suspected. Altered mental status. EXAM: MRI HEAD WITHOUT CONTRAST TECHNIQUE: Multiplanar, multiecho pulse sequences of the brain and surrounding structures were obtained without intravenous contrast. COMPARISON:  Non-contrast head CT and CT angiogram head/neck performed earlier today 12/09/2023. FINDINGS: Brain: Cerebral volume is normal. Punctate acute infarct within the right cerebellar hemisphere (series 5, image 75) (series 7, image 53). No cortical encephalomalacia is identified. No significant cerebral white matter disease. No evidence of an intracranial mass. No extra-axial fluid collection. No midline shift. Vascular: Maintained flow voids within the proximal large arterial vessels. Skull and upper cervical spine: No focal worrisome marrow lesion. Sinuses/Orbits: No mass or acute finding within the imaged orbits. No significant paranasal sinus disease. IMPRESSION: 1. Punctate acute infarct within the right cerebellar hemisphere. 2. Otherwise unremarkable non-contrast MRI appearance of the brain. Electronically Signed   By: Bascom Lily D.O.   On: 12/09/2023 10:14   CT ANGIO HEAD NECK W WO CM (CODE STROKE) Result Date: 12/09/2023 CLINICAL DATA:  Provided history: Neuro deficit, acute, stroke suspected. EXAM: CT ANGIOGRAPHY HEAD AND NECK WITH AND WITHOUT CONTRAST TECHNIQUE: Multidetector CT imaging of the head and neck was performed using the standard protocol during bolus administration of intravenous contrast. Multiplanar CT image reconstructions and MIPs were obtained to evaluate the vascular anatomy. Carotid stenosis measurements (when applicable) are obtained utilizing NASCET criteria, using the distal internal carotid diameter as the denominator. RADIATION DOSE REDUCTION: This exam was performed according to the departmental dose-optimization program which includes automated exposure control, adjustment of  the mA and/or kV according to patient size and/or use of iterative reconstruction technique. CONTRAST:  75mL OMNIPAQUE  IOHEXOL  350 MG/ML SOLN COMPARISON:  Noncontrast head CT performed earlier today 12/09/2023. FINDINGS: CTA NECK FINDINGS Upper chest: No consolidation within the imaged lung apices. Other neck: No neck mass or cervical lymphadenopathy. Skeleton: Nonspecific reversal of the expected cervical lordosis. Cervical spondylosis. No acute fracture or aggressive osseous lesion. Aortic arch: Common origin of the innominate and left common carotid arteries. The visualized thoracic aorta is normal in caliber. Streak/beam hardening artifact arising from a dense contrast bolus partially obscures the right subclavian artery. Within this limitation, there is no appreciable hemodynamically significant innominate or proximal subclavian artery stenosis. Right carotid system: CCA and ICA patent within the neck without stenosis. Mild atherosclerotic plaque at the carotid bifurcation. Tortuosity of the cervical ICA. No evidence of dissection. Left carotid system: CCA and ICA patent within the neck without stenosis. Mild sclerotic plaque about the carotid bifurcation. Tortuosity of the cervical ICA. No evidence of dissection. Vertebral arteries: Codominant and patent within neck  without stenosis or significant atherosclerotic disease. No evidence of dissection. Review of the MIP images confirms the above findings CTA HEAD FINDINGS Anterior circulation: The intracranial internal carotid arteries are patent. Nonstenotic atherosclerotic plaque within both vessels. The M1 middle cerebral arteries are patent. No M2 proximal branch occlusion or high-grade proximal stenosis. The anterior cerebral arteries are patent. Hypoplastic right A1 segment. No intracranial aneurysm is identified. Posterior circulation: The intracranial vertebral arteries are patent. The basilar artery is patent. The posterior cerebral arteries are patent.  Posterior communicating arteries are present bilaterally. Venous sinuses: Within the limitations of contrast timing, no convincing thrombus. Anatomic variants: As described. Review of the MIP images confirms the above findings No emergent large vessel occlusion identified. These results were communicated to Dr. Cleone Dad at 9:53 amon 6/2/2025by text page via the [facility] messaging system. IMPRESSION: CTA neck: The common carotid, internal carotid and vertebral arteries are patent within the neck without stenosis. Mild atherosclerotic plaque about the carotid bifurcations, bilaterally. CTA head: No proximal intracranial large vessel occlusion or high-grade proximal arterial stenosis. Electronically Signed   By: Bascom Lily D.O.   On: 12/09/2023 09:55   CT HEAD CODE STROKE WO CONTRAST Result Date: 12/09/2023 CLINICAL DATA:  Code stroke. Provided history: Neuro deficit, acute, stroke suspected. EXAM: CT HEAD WITHOUT CONTRAST TECHNIQUE: Contiguous axial images were obtained from the base of the skull through the vertex without intravenous contrast. RADIATION DOSE REDUCTION: This exam was performed according to the departmental dose-optimization program which includes automated exposure control, adjustment of the mA and/or kV according to patient size and/or use of iterative reconstruction technique. COMPARISON:  Head CT 04/01/2020. FINDINGS: Brain: Cerebral volume is normal. Partially empty sella turcica. There is no acute intracranial hemorrhage. No demarcated cortical infarct. No extra-axial fluid collection. No evidence of an intracranial mass. No midline shift. Vascular: No hyperdense vessel. Skull: No calvarial fracture or aggressive osseous lesion. Sinuses/Orbits: No mass or acute finding within the imaged orbits. No significant paranasal sinus disease at the imaged levels. ASPECTS [facility] Stroke Program Early CT Score) - Ganglionic level infarction (caudate, lentiform nuclei, internal capsule, insula, M1-M3  cortex): 7 - Supraganglionic infarction (M4-M6 cortex): 3 Total score (0-10 with 10 being normal): 10 No evidence of an acute intracranial abnormality. These results were communicated to Dr. Cleone Dad At 9:29 amon 6/2/2025by text page via the [facility] messaging system. IMPRESSION: No evidence of an acute intracranial abnormality. ASPECTS is 10 Electronically Signed   By: Bascom Lily D.O.   On: 12/09/2023 09:29        Scheduled Meds:   stroke: early stages of recovery book   Does not apply Once   enoxaparin (LOVENOX) injection  40 mg Subcutaneous Q24H   insulin aspart  0-9 Units Subcutaneous TID WC   sodium chloride  flush  3-10 mL Intravenous Q12H   Continuous Infusions:   LOS: 1 day       Alphonsus Jeans, MD [facility] Pager [phone]  If 7PM-7AM, please contact night-coverage [email or web address] 12/10/2023, 8:12 AM

## 2023-12-10 NOTE — Progress Notes (Signed)
   12/10/23 0900  Spiritual Encounters  Type of Visit Follow up  Care provided to: Pt and family  Conversation partners present during encounter Physician  Referral source Chaplain assessment  Reason for visit Routine spiritual support  OnCall Visit No  Interventions  Spiritual Care Interventions Made Compassionate presence;Reflective listening  Intervention Outcomes  Outcomes Awareness around self/spiritual resourses;Awareness of support  Spiritual Care Plan  Spiritual Care Issues Still Outstanding No further spiritual care needs at this time (see row info)   Chaplain entered room to speak with patient and patient remembered her. Her two sons and her mother were at her bedside. They were trying to get the glue from the patients hair and chaplain found a cap with shampoo and gave it to the patient. Patient did not remember chaplain yesterday, but was able to say, "I remember you, you prayed for me," this morning.

## 2023-12-10 NOTE — TOC Initial Note (Addendum)
 Transition of Care [facility]) - Initial/Assessment Note    Patient Details  Name: Laura Murray MRN: [id number] Date of Birth: 15-Dec-1965  Transition [facility]) CM/SW Contact:    Crayton Docker, RN 12/10/2023, 3:35 PM  Clinical Narrative:                  CM to patient's room regarding TOC screening assessment. CM introduced case management role and discharge care planning process. Patient verbalized understanding and agreement with screening assessment. Patient's husband, Laura Murray is at patient's bedside. Per patient lives at home with patient's husband. Per patient, patient's husband, will provide transportation to home and caregiver support. Per patient, no previous home health, no previous SNF experience. Patient states she does not use DME.  Per PT, signed off and per OT, no OT follow up.  Expected Discharge Plan: Home/Self Care Barriers to Discharge: Continued Medical Work up   Patient Goals and CMS Choice    Home/self care  Expected Discharge Plan and Services    Home/self care   Last place lived in the past 2 months: Single Family Home      Prior Living Arrangements/Services Living arrangements for the past 2 months: Skilled Nursing Facility Lives with:: Spouse Patient language and need for interpreter reviewed:: No Do you feel safe going back to the place where you live?: Yes      Need for Family Participation in Patient Care: Yes (Comment) Care giver support system in place?: Yes (comment)   Criminal Activity/Legal Involvement Pertinent to Current Situation/Hospitalization: No - Comment as needed  Activities of Daily Living   ADL Screening (condition at time of admission) Independently performs ADLs?: Yes (appropriate for developmental age) Is the patient deaf or have difficulty hearing?: No Does the patient have difficulty seeing, even when wearing glasses/contacts?: No Does the patient have difficulty concentrating, remembering, or making decisions?:  No  Permission Sought/Granted Permission sought to share information with : Case Manager, Family Supports Permission granted to share information with : Yes, Verbal Permission Granted     Emotional Assessment Appearance:: Appears stated age Attitude/Demeanor/Rapport: Engaged   Orientation: : Oriented to Self, Oriented to Place, Oriented to  Time, Oriented to Situation Alcohol / Substance Use: Not Applicable    Admission diagnosis:  Encephalopathy [G93.40] Patient Active Problem List   Diagnosis Date Noted   Acute encephalopathy 12/09/2023   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   Encephalopathy 12/09/2023   Radiculitis of right cervical region 07/30/2022   Acquired iron  deficiency anemia due to decreased absorption 06/25/2019   Raynaud phenomenon 02/04/2019   Diastasis recti 03/14/2017   Chronic diarrhea 12/20/2014   Gastric varices 12/20/2014   H/O: obesity 12/20/2014   Cannot sleep 12/20/2014   Iron  deficiency anemia due to chronic blood loss 12/20/2014   Excessive or frequent menstruation 12/20/2014   Extreme obesity 12/20/2014   Cyst of ovary 12/20/2014   B12 deficiency 12/20/2014   Vitamin D  deficiency 12/20/2014   Breast cyst 02/03/2014   Cyst (solitary) of breast 02/03/2014   PCP:  Lamon Pillow, MD Pharmacy:   [facility] [address] [place] - [address] [place] [address] Phone: [phone] Fax: [phone]  [facility] - [place], [place] - [address] [place] [address] Phone: [phone] Fax: [phone]     Social Drivers of Health (SDOH) Social History: SDOH Screenings   Food Insecurity: No Food Insecurity (12/09/2023)  Housing: Low Risk  (  12/09/2023)  Transportation Needs: No Transportation Needs (12/09/2023)  Utilities: Not At Risk (12/09/2023)  Alcohol Screen: Low Risk  (08/17/2022)  Depression (PHQ2-9): Low Risk  (08/17/2022)  Financial Resource Strain: Low Risk   (06/19/2023)   Received from [facility]  Stress: No Stress Concern Present (06/19/2023)   Received from [facility]  Tobacco Use: Low Risk  (12/09/2023)   SDOH Interventions:     Readmission Risk Interventions     No data to display

## 2023-12-10 NOTE — Evaluation (Signed)
 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   Encephalopathy 12/09/2023   Radiculitis of right cervical region 07/30/2022   Acquired iron  deficiency anemia due to decreased absorption 06/25/2019   Raynaud phenomenon 02/04/2019   Diastasis recti 03/14/2017   Chronic diarrhea 12/20/2014   Gastric varices 12/20/2014   H/O: obesity 12/20/2014   Cannot sleep 12/20/2014   Iron  deficiency anemia due to chronic blood loss 12/20/2014   Excessive or frequent menstruation 12/20/2014   Extreme obesity 12/20/2014   Cyst of ovary 12/20/2014   B12 deficiency 12/20/2014   Vitamin D  deficiency 12/20/2014   Breast cyst 02/03/2014   Cyst (solitary) of breast 02/03/2014   Past Medical History:  Past Medical History:  Diagnosis Date   Anemia    Insomnia    Obesity    Ovarian cyst    Upper GI bleeding 2015   gastric ulcer   Past Surgical History:  Past Surgical History:  Procedure Laterality Date   BREAST CYST ASPIRATION Left 02/02/2014   Dr Marquita Situ   BREAST CYST ASPIRATION Right 02/2018   CHOLECYSTECTOMY  2009   COLONOSCOPY  2014   GASTRIC BYPASS  2006, 2015   2015 revision   GASTRIC RESECTION  2015   SHOULDER ARTHROSCOPY WITH OPEN ROTATOR CUFF REPAIR Right 03/27/2018   Procedure: SHOULDER ARTHROSCOPY WITH OPEN ROTATOR CUFF REPAIR;  Surgeon: Elner Hahn, MD;  Location: [facility] ORS;  Service: Orthopedics;  Laterality: Right;   SHOULDER CLOSED REDUCTION Right 07/08/2018   Procedure: CLOSED MANIPULATION SHOULDER;  Surgeon: Elner Hahn, MD;  Location: [facility];  Service: Orthopedics;  Laterality: Right;   STERIOD INJECTION Right 07/08/2018   Procedure: STEROID INJECTION OF RIGHT SHOULDER;  Surgeon:  Elner Hahn, MD;  Location: [facility] OR[facility];  Service: Orthopedics;  Laterality: Right;   TONSILLECTOMY     TUBAL LIGATION     UPPER GI ENDOSCOPY     HPI:  Laura Murray is a 58 y.o. female with medical history significant of obesity status post gastric bypass surgery, chronic insomnia, presented with acute confusion, hypertension, and elevated liver enzymes after being informed about a death in her family today. Brain MRI revealing "Incidental punctate right cerebellar stroke"   Assessment / Plan / Recommendation Clinical Impression  Pt seen for cognitive-communication evaluation. Assessment completed via informal means and diagnostic interviewing. Pt demonstrated intact basic cognitive-linguistic ability. No ST f/u recommended at this time.    SLP Assessment  SLP Recommendation/Assessment: Patient does not need any further Speech Lanaguage Pathology Services    Recommendations for follow up therapy are one component of a multi-disciplinary discharge planning process, led by the attending physician.  Recommendations may be updated based on patient status, additional functional criteria and insurance authorization.    Follow Up Recommendations  No SLP follow up    Assistance Recommended at Discharge   (defer to OT/PT)  Functional Status Assessment Patient has not had a recent decline in their functional status        SLP Evaluation Cognition  Overall Cognitive Status: Within Functional Limits for tasks assessed Orientation Level: Oriented X4       Comprehension  Auditory Comprehension Overall Auditory Comprehension: Appears within functional limits for tasks assessed    Expression Expression Primary Mode  of Expression: Verbal Verbal Expression Overall Verbal Expression: Appears within functional limits for tasks assessed   Oral / Motor  Oral Motor/Sensory Function Overall Oral Motor/Sensory Function: Within functional limits Motor Speech Overall Motor Speech: Appears  within functional limits for tasks assessed           Dia Forget, M.S., CCC-SLP Speech-Language Pathologist [facility] [phone] (ASCOM)  Adin Honour 12/10/2023, 9:49 AM

## 2023-12-10 NOTE — Plan of Care (Signed)
  Problem: Education: Goal: Knowledge of disease or condition will improve Outcome: Progressing   Problem: Coping: Goal: Will verbalize positive feelings about self Outcome: Progressing Goal: Will identify appropriate support needs Outcome: Progressing   Problem: Health Behavior/Discharge Planning: Goal: Goals will be collaboratively established with patient/family Outcome: Progressing   Problem: Self-Care: Goal: Ability to communicate needs accurately will improve Outcome: Progressing   Problem: Education: Goal: Knowledge of General Education information will improve Description: Including pain rating scale, medication(s)/side effects and non-pharmacologic comfort measures Outcome: Progressing   Problem: Health Behavior/Discharge Planning: Goal: Ability to manage health-related needs will improve Outcome: Progressing

## 2023-12-11 ENCOUNTER — Inpatient Hospital Stay (HOSPITAL_COMMUNITY)
Admit: 2023-12-11 | Discharge: 2023-12-11 | Disposition: A | Discharge status: Home / Self Care | Attending: Neurology | Admitting: Neurology

## 2023-12-11 DIAGNOSIS — I6389 Other cerebral infarction: Secondary | ICD-10-CM | POA: Diagnosis not present

## 2023-12-11 DIAGNOSIS — E663 Overweight: Secondary | ICD-10-CM

## 2023-12-11 DIAGNOSIS — R7989 Other specified abnormal findings of blood chemistry: Secondary | ICD-10-CM

## 2023-12-11 DIAGNOSIS — E162 Hypoglycemia, unspecified: Secondary | ICD-10-CM

## 2023-12-11 DIAGNOSIS — G9341 Metabolic encephalopathy: Secondary | ICD-10-CM

## 2023-12-11 DIAGNOSIS — G4709 Other insomnia: Secondary | ICD-10-CM

## 2023-12-11 DIAGNOSIS — N1330 Unspecified hydronephrosis: Secondary | ICD-10-CM

## 2023-12-11 HISTORY — DX: Metabolic encephalopathy: G93.41

## 2023-12-11 LAB — COMPREHENSIVE METABOLIC PANEL WITH GFR
ALT: 297 U/L — ABNORMAL HIGH (ref 0–44)
AST: 121 U/L — ABNORMAL HIGH (ref 15–41)
Albumin: 3.5 g/dL (ref 3.5–5.0)
Alkaline Phosphatase: 142 U/L — ABNORMAL HIGH (ref 38–126)
Anion gap: 8 (ref 5–15)
BUN: 16 mg/dL (ref 6–20)
CO2: 26 mmol/L (ref 22–32)
Calcium: 9.1 mg/dL (ref 8.9–10.3)
Chloride: 106 mmol/L (ref 98–111)
Creatinine, Ser: 0.69 mg/dL (ref 0.44–1.00)
GFR, Estimated: >60 mL/min (ref >60)
Glucose, Bld: 92 mg/dL (ref 70–99)
Potassium: 3.9 mmol/L (ref 3.5–5.1)
Sodium: 140 mmol/L (ref 135–145)
Total Bilirubin: 0.6 mg/dL (ref 0.0–1.2)
Total Protein: 6.8 g/dL (ref 6.5–8.1)

## 2023-12-11 LAB — ECHOCARDIOGRAM COMPLETE
AR max vel: 2.87 cm2
AV Area VTI: 3.27 cm2
AV Area mean vel: 2.97 cm2
AV Mean grad: 3 mmHg
AV Peak grad: 5.2 mmHg
Ao pk vel: 1.14 m/s
Area-P 1/2: 3.76 cm2
Height: 64 in
MV VTI: 2.96 cm2
S' Lateral: 2.4 cm
Weight: 2497.37 [oz_av]

## 2023-12-11 LAB — CBC
HCT: 39.4 % (ref 36.0–46.0)
Hemoglobin: 13 g/dL (ref 12.0–15.0)
MCH: 30.2 pg (ref 26.0–34.0)
MCHC: 33 g/dL (ref 30.0–36.0)
MCV: 91.4 fL (ref 80.0–100.0)
Platelets: 221 10*3/uL (ref 150–400)
RBC: 4.31 MIL/uL (ref 3.87–5.11)
RDW: 12.8 % (ref 11.5–15.5)
WBC: 3.8 10*3/uL — ABNORMAL LOW (ref 4.0–10.5)
nRBC: 0 % (ref 0.0–0.2)

## 2023-12-11 LAB — GLUCOSE, CAPILLARY: Glucose-Capillary: 92 mg/dL (ref 70–99)

## 2023-12-11 MED ORDER — ASPIRIN 81 MG PO TBEC: 81.0000 mg | DELAYED_RELEASE_TABLET | Freq: Every day | ORAL | 0 refills | Status: DC

## 2023-12-11 MED ORDER — PANTOPRAZOLE SODIUM 40 MG PO TBEC: 40.0000 mg | DELAYED_RELEASE_TABLET | Freq: Every day | ORAL | 0 refills | Status: DC

## 2023-12-11 MED ORDER — TRAZODONE HCL 50 MG PO TABS: 25.0000 mg | ORAL_TABLET | Freq: Every evening | ORAL | 0 refills | Status: DC | PRN

## 2023-12-11 MED ORDER — TRAZODONE HCL 50 MG PO TABS
25.0000 mg | ORAL_TABLET | Freq: Every evening | ORAL | Status: DC | PRN
  Administered 2023-12-11: 25 mg via ORAL
  Filled 2023-12-11: qty 1

## 2023-12-11 NOTE — Assessment & Plan Note (Signed)
 Can consider urology referral as outpatient

## 2023-12-11 NOTE — Assessment & Plan Note (Signed)
 Patient must eat.  Hold off on Wegovy .

## 2023-12-11 NOTE — Assessment & Plan Note (Addendum)
 Trending in the right direction.  Will hold off on amitriptyline  and also Wegovy .  Acute hepatitis profile negative cholecystectomy without biliary ductal dilation.

## 2023-12-11 NOTE — Discharge Summary (Signed)
 Physician Discharge Summary   Patient: Laura Murray MRN: [id number] DOB: December 25, 1965  Admit date:     12/09/2023  Discharge date: 12/11/23  Discharge Physician: Laura Murray   PCP: Laura Pillow, MD   Recommendations at discharge:   Follow-up PCP 5 days  Discharge Diagnoses: Principal Problem:   Acute stroke due to ischemia [facility]) Active Problems:   Acute metabolic encephalopathy   Elevated liver function tests   Overweight (BMI 25.0-29.9)   Hypoglycemia   Bilateral hydronephrosis   Insomnia  Resolved Problems:   * No resolved hospital problems. *  Hospital Course: 58 y.o. female with medical history significant of obesity status post gastric bypass surgery, chronic insomnia, presented with acute confusion.   Patient is confused and unable to provide any history, history provided by patient's husband and 2 sons at bedside.  Family reported that the patient has a chronic insomnia and taking prescription medications for sleep.  Last night patient felt headache and took 1 Tylenol  and went to sleep.  This morning patient woke up and received a phone call from one of the relatives who told her that one of her uncle passed away last night.  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  Family reported that when talking to her patient appeared to be very confused and forgetful and kept asking questions in circle "Why am I here" "What day is today" and thinking of her mother other than uncle has just passed away. Her mother however passed away long time ago.   ED Course: Afebrile, nontachycardic blood pressure 118/88 O2 saturation 100% on room air.  Code stroke was called for appearingly aphasia on arrival.  CT head and CTA negative for acute findings or LVO.  MRI showed punctate stroke of the right cerebellar.  Blood work showed AST 450, ALT 495.   Bilirubin 1.4 INR 0.9 sodium 140 potassium 3.4 BUN 10 creatinine 0.7 glucose 109.  6/2.  MRI of the brain shows punctate acute infarct within the right cerebellar hemisphere.  CT angiogram did not show any large vessel occlusion.  EEG normal limits. 6/4.  echocardiogram shows an EF of 60 to 65%.  Patient feeling much better than when she came in.  Stable to go home.  Patient okay with starting enteric-coated aspirin for stroke prevention.  Hold off on Lipitor secondary to elevated liver function test.  Consider starting as outpatient.    Assessment and Plan: * Acute stroke due to ischemia (HCC) Right cerebellum.  Patient not having any incoordination or balance issues.  Feels okay.  Patient okay starting enteric-coated aspirin on a daily basis along with proton pump inhibitor.  Hold off on Lipitor secondary to elevated liver function tests.  Consider starting as outpatient.  LDL 81.  Acute metabolic encephalopathy Resolved.  Could be secondary to stroke versus medications.  Low sugar could have been contributing.  Elevated liver function tests Trending in the right direction.  Will hold off on amitriptyline  and also Wegovy .  Acute hepatitis profile negative cholecystectomy without biliary ductal dilation.  Overweight (BMI 25.0-29.9) Hold off on Wegovy   Hypoglycemia Patient must eat.  Hold off on Wegovy .  Bilateral hydronephrosis Can consider urology referral as outpatient  Insomnia Amitriptyline  switched over to trazodone as needed.         Consultants: Neurology Procedures performed: None Disposition: Home Diet recommendation:  Cardiac diet DISCHARGE MEDICATION: Allergies as of 12/11/2023       Reactions   Nsaids Other (See Comments)   Avoid due to gastric bypass         Medication List     STOP taking these medications    amitriptyline  50 MG tablet Commonly known as: ELAVIL    CALCIUM 500 PO   CYANOCOBALAMIN  IJ   Wegovy  2.4 MG/0.75ML Soaj Generic drug:  Semaglutide -Weight Management       TAKE these medications    acetaminophen  500 MG tablet Commonly known as: TYLENOL  Take 1,000 mg by mouth every 6 (six) hours as needed for moderate pain or headache.   aspirin EC 81 MG tablet Take 1 tablet (81 mg total) by mouth daily. Swallow whole.   pantoprazole 40 MG tablet Commonly known as: Protonix Take 1 tablet (40 mg total) by mouth daily.   traZODone 50 MG tablet Commonly known as: DESYREL Take 0.5 tablets (25 mg total) by mouth at bedtime as needed for sleep.   Vitamin D  (Ergocalciferol ) 1.25 MG (50000 UNIT) Caps capsule Commonly known as: DRISDOL  TAKE 1 CAPSULE ONCE WEEKLY        Follow-up Information     Laura Pillow, MD. Go on 12/20/2023.   Specialty: Family Medicine Why: @10am  Contact information: [address] [place] [address] [phone]                Discharge Exam: Laura Murray Weights   12/09/23 1011  Weight: 70.8 kg   Physical Exam HENT:     Head: Normocephalic.     Mouth/Throat:     Pharynx: No oropharyngeal exudate.  Cardiovascular:     Rate and Rhythm: Normal rate and regular rhythm.     Heart sounds: Normal heart sounds, S1 normal and S2 normal.  Pulmonary:     Breath sounds: No decreased breath sounds, wheezing, rhonchi or rales.  Abdominal:     Palpations: Abdomen is soft.     Tenderness: There is no abdominal tenderness.  Musculoskeletal:     Right lower leg: No swelling.     Left lower leg: No swelling.  Skin:    General: Skin is warm.     Findings: No rash.  Neurological:     Mental Status: She is alert and oriented to person, place, and time.      Condition at discharge: stable  The results of significant diagnostics from this hospitalization (including imaging, microbiology, ancillary and laboratory) are listed below for reference.   Imaging Studies: ECHOCARDIOGRAM COMPLETE Result Date: 12/11/2023    ECHOCARDIOGRAM REPORT   Patient Name:   Laura C  Murray Date of Exam: 12/11/2023 Medical Rec #:  [id number]           Height:       64.0 in Accession #:    [id number]          Weight:       156.1 lb Date of Birth:  [birth year]           BSA:          1.761 m Patient Age:    58 years            BP:           150/62 mmHg Patient Gender: F                   HR:           62 bpm. Exam Location:  [facility] Procedure: 2D Echo, Cardiac Doppler, Color Doppler and Saline Contrast Bubble            Study (Both Spectral and Color Flow Doppler were utilized during            procedure). Indications:     Stroke I63.9  History:         Patient has no prior history of Echocardiogram examinations.                  Anemia.  Sonographer:     Broadus Canes Referring Phys:  [id number] Ronnette Coke Diagnosing Phys: Sammy Crisp MD IMPRESSIONS  1. Left ventricular ejection fraction, by estimation, is 60 to 65%. The left ventricle has normal function. The left ventricle has no regional wall motion abnormalities. Left ventricular diastolic parameters are consistent with Grade I diastolic dysfunction (impaired relaxation).  2. Right ventricular systolic function is normal. The right ventricular size is normal. Tricuspid regurgitation signal is inadequate for assessing PA pressure.  3. The mitral valve is normal in structure. No evidence of mitral valve regurgitation. No evidence of mitral stenosis.  4. The aortic valve is tricuspid. Aortic valve regurgitation is not visualized. No aortic stenosis is present.  5. The inferior vena cava is normal in size with greater than 50% respiratory variability, suggesting right atrial pressure of 3 mmHg.  6. Agitated saline contrast bubble study was negative, with no evidence of any interatrial shunt. FINDINGS  Left Ventricle: Left ventricular ejection fraction, by estimation, is 60 to 65%. The left ventricle has normal function. The left ventricle has no regional wall motion abnormalities. The left ventricular internal cavity size was normal in size. There  is  borderline left ventricular hypertrophy. Left ventricular diastolic parameters are consistent with Grade I diastolic dysfunction (impaired relaxation). Right Ventricle: The right ventricular size is normal. No increase in right ventricular wall thickness. Right ventricular systolic function is normal. Tricuspid regurgitation signal is inadequate for assessing PA pressure. Left Atrium: Left atrial size was normal in size. Right Atrium: Right atrial size was normal in size. Pericardium: There is no evidence of pericardial effusion. Mitral Valve: The mitral valve is normal in structure. No evidence of mitral valve regurgitation. No evidence of mitral valve stenosis. MV peak gradient, 3.1 mmHg. The mean mitral valve gradient is 1.0 mmHg. Tricuspid Valve: The tricuspid valve is normal in structure. Tricuspid valve regurgitation is trivial. Aortic Valve: The aortic valve is tricuspid. Aortic valve regurgitation is not visualized. No aortic stenosis is present. Aortic valve mean gradient measures 3.0 mmHg. Aortic valve peak gradient measures 5.2 mmHg. Aortic valve area, by VTI measures 3.27 cm. Pulmonic Valve: The pulmonic valve was normal in structure. Pulmonic valve regurgitation is mild. No evidence of pulmonic stenosis. Aorta: The aortic root is normal in size and structure. Pulmonary Artery: The pulmonary artery is of normal size. Venous: The inferior vena cava is normal in size with greater than 50% respiratory variability, suggesting right atrial pressure of 3 mmHg. IAS/Shunts: The interatrial septum was not well visualized. Agitated saline contrast was given intravenously to evaluate for intracardiac shunting. Agitated saline contrast bubble study was negative, with no evidence of any interatrial shunt.  LEFT VENTRICLE PLAX 2D LVIDd:         3.80 cm   Diastology LVIDs:         2.40 cm   LV e' medial:    8.27 cm/s LV PW:         0.84 cm  LV E/e' medial:  7.3 LV IVS:        1.04 cm   LV e' lateral:   7.94 cm/s  LVOT diam:     2.00 cm   LV E/e' lateral: 7.6 LV SV:         71 LV SV Index:   40 LVOT Area:     3.14 cm  RIGHT VENTRICLE RV Basal diam:  2.00 cm RV Mid diam:    2.30 cm LEFT ATRIUM             Index        RIGHT ATRIUM          Index LA diam:        1.80 cm 1.02 cm/m   RA Area:     7.61 cm LA Vol (A2C):   33.4 ml 18.97 ml/m  RA Volume:   10.10 ml 5.74 ml/m LA Vol (A4C):   29.0 ml 16.47 ml/m LA Biplane Vol: 32.8 ml 18.63 ml/m  AORTIC VALVE AV Area (Vmax):    2.87 cm AV Area (Vmean):   2.97 cm AV Area (VTI):     3.27 cm AV Vmax:           114.00 cm/s AV Vmean:          74.900 cm/s AV VTI:            0.217 m AV Peak Grad:      5.2 mmHg AV Mean Grad:      3.0 mmHg LVOT Vmax:         104.00 cm/s LVOT Vmean:        70.800 cm/s LVOT VTI:          0.226 m LVOT/AV VTI ratio: 1.04  AORTA Ao Root diam: 3.10 cm MITRAL VALVE MV Area (PHT): 3.76 cm    SHUNTS MV Area VTI:   2.96 cm    Systemic VTI:  0.23 m MV Peak grad:  3.1 mmHg    Systemic Diam: 2.00 cm MV Mean grad:  1.0 mmHg MV Vmax:       0.88 m/s MV Vmean:      55.5 cm/s MV Decel Time: 202 msec MV E velocity: 60.30 cm/s MV A velocity: 80.70 cm/s MV E/A ratio:  0.75 Sammy Crisp MD Electronically signed by Sammy Crisp MD Signature Date/Time: 12/11/2023/8:52:44 AM    Final    US  RENAL Result Date: 12/10/2023 CLINICAL DATA:  Hydronephrosis. EXAM: RENAL / URINARY TRACT ULTRASOUND COMPLETE COMPARISON:  December 08, 2021. FINDINGS: Right Kidney: Renal measurements: 9.0 x 5.4 x 5.1 cm = volume: 131 mL. Echogenicity within normal limits. No mass visualized. Moderate hydronephrosis is noted. Left Kidney: Renal measurements: 10.1 x 7.0 x 6.0 cm = volume: 219 mL. Echogenicity within normal limits. No mass visualized. Moderate hydronephrosis is noted. Bladder: Appears normal for degree of bladder distention. Bilateral ureteral jets are noted. Calculated prevoid volume of 62 mL. Other: None. IMPRESSION: Moderate bilateral hydronephrosis. Electronically Signed   By: Rosalene Colon M.D.   On: 12/10/2023 15:12   EEG adult Result Date: 12/09/2023 Arleene Lack, MD     12/09/2023  9:02 PM Patient Name: TEIRA ARCILLA MRN: [id number] Epilepsy Attending: Arleene Lack Referring Physician/Provider: Ronnette Coke, MD Date: 12/09/2023 Duration: 27.20 mins Patient history: 58 y.o. female presenting with sudden onset confusion, hypertension, and elevated liver enzymes after being informed about a death in her family today. EEG to evaluate for seizure Level of  alertness: Awake AEDs during EEG study: None Technical aspects: This EEG study was done with scalp electrodes positioned according to the 10-20 International system of electrode placement. Electrical activity was reviewed with band pass filter of 1-70Hz , sensitivity of 7 uV/mm, display speed of 72mm/sec with a 60Hz  notched filter applied as appropriate. EEG data were recorded continuously and digitally stored.  Video monitoring was available and reviewed as appropriate. Description: EEG showed an excessive amount of 12 to 15 Hz beta activity distributed symmetrically and diffusely. Hyperventilation and photic stimulation were not performed.   ABNORMALITY - Excessive beta, generalized IMPRESSION: This study is within normal limits. The excessive beta activity seen in the background is most likely due to the effect of benzodiazepine and is a benign EEG pattern. No seizures or epileptiform discharges were seen throughout the recording. A normal interictal EEG does not exclude the diagnosis of epilepsy. Priyanka Suzanne Erps   US  ABDOMEN LIMITED RUQ (LIVER/GB) Result Date: 12/09/2023 CLINICAL DATA:  Abdominal pain.  Prior cholecystectomy. EXAM: ULTRASOUND ABDOMEN LIMITED RIGHT UPPER QUADRANT COMPARISON:  CT 03/23/2013 FINDINGS: Gallbladder: Surgically absent Common bile duct: Diameter: Normal, 7 mm Liver: No focal lesion identified. Within normal limits in parenchymal echogenicity. Portal vein is patent on color Doppler imaging with  normal direction of blood flow towards the liver. Other: Possible right-sided hydronephrosis, incompletely imaged. IMPRESSION: Cholecystectomy without biliary duct dilatation. Possible right-sided hydronephrosis, incompletely imaged. Depending on clinical concern, this could either be further evaluated with renal ultrasound or CT stone study. Electronically Signed   By: Lore Rode M.D.   On: 12/09/2023 12:57   MR BRAIN WO CONTRAST Result Date: 12/09/2023 CLINICAL DATA:  Provided history: Neuro deficit, acute, stroke suspected. Altered mental status. EXAM: MRI HEAD WITHOUT CONTRAST TECHNIQUE: Multiplanar, multiecho pulse sequences of the brain and surrounding structures were obtained without intravenous contrast. COMPARISON:  Non-contrast head CT and CT angiogram head/neck performed earlier today 12/09/2023. FINDINGS: Brain: Cerebral volume is normal. Punctate acute infarct within the right cerebellar hemisphere (series 5, image 75) (series 7, image 53). No cortical encephalomalacia is identified. No significant cerebral white matter disease. No evidence of an intracranial mass. No extra-axial fluid collection. No midline shift. Vascular: Maintained flow voids within the proximal large arterial vessels. Skull and upper cervical spine: No focal worrisome marrow lesion. Sinuses/Orbits: No mass or acute finding within the imaged orbits. No significant paranasal sinus disease. IMPRESSION: 1. Punctate acute infarct within the right cerebellar hemisphere. 2. Otherwise unremarkable non-contrast MRI appearance of the brain. Electronically Signed   By: Bascom Lily D.O.   On: 12/09/2023 10:14   CT ANGIO HEAD NECK W WO CM (CODE STROKE) Result Date: 12/09/2023 CLINICAL DATA:  Provided history: Neuro deficit, acute, stroke suspected. EXAM: CT ANGIOGRAPHY HEAD AND NECK WITH AND WITHOUT CONTRAST TECHNIQUE: Multidetector CT imaging of the head and neck was performed using the standard protocol during bolus administration of  intravenous contrast. Multiplanar CT image reconstructions and MIPs were obtained to evaluate the vascular anatomy. Carotid stenosis measurements (when applicable) are obtained utilizing NASCET criteria, using the distal internal carotid diameter as the denominator. RADIATION DOSE REDUCTION: This exam was performed according to the departmental dose-optimization program which includes automated exposure control, adjustment of the mA and/or kV according to patient size and/or use of iterative reconstruction technique. CONTRAST:  75mL OMNIPAQUE  IOHEXOL  350 MG/ML SOLN COMPARISON:  Noncontrast head CT performed earlier today 12/09/2023. FINDINGS: CTA NECK FINDINGS Upper chest: No consolidation within the imaged lung apices. Other neck: No neck mass or  cervical lymphadenopathy. Skeleton: Nonspecific reversal of the expected cervical lordosis. Cervical spondylosis. No acute fracture or aggressive osseous lesion. Aortic arch: Common origin of the innominate and left common carotid arteries. The visualized thoracic aorta is normal in caliber. Streak/beam hardening artifact arising from a dense contrast bolus partially obscures the right subclavian artery. Within this limitation, there is no appreciable hemodynamically significant innominate or proximal subclavian artery stenosis. Right carotid system: CCA and ICA patent within the neck without stenosis. Mild atherosclerotic plaque at the carotid bifurcation. Tortuosity of the cervical ICA. No evidence of dissection. Left carotid system: CCA and ICA patent within the neck without stenosis. Mild sclerotic plaque about the carotid bifurcation. Tortuosity of the cervical ICA. No evidence of dissection. Vertebral arteries: Codominant and patent within neck without stenosis or significant atherosclerotic disease. No evidence of dissection. Review of the MIP images confirms the above findings CTA HEAD FINDINGS Anterior circulation: The intracranial internal carotid arteries are  patent. Nonstenotic atherosclerotic plaque within both vessels. The M1 middle cerebral arteries are patent. No M2 proximal branch occlusion or high-grade proximal stenosis. The anterior cerebral arteries are patent. Hypoplastic right A1 segment. No intracranial aneurysm is identified. Posterior circulation: The intracranial vertebral arteries are patent. The basilar artery is patent. The posterior cerebral arteries are patent. Posterior communicating arteries are present bilaterally. Venous sinuses: Within the limitations of contrast timing, no convincing thrombus. Anatomic variants: As described. Review of the MIP images confirms the above findings No emergent large vessel occlusion identified. These results were communicated to Dr. Cleone Dad at 9:53 amon 6/2/2025by text page via the [facility] messaging system. IMPRESSION: CTA neck: The common carotid, internal carotid and vertebral arteries are patent within the neck without stenosis. Mild atherosclerotic plaque about the carotid bifurcations, bilaterally. CTA head: No proximal intracranial large vessel occlusion or high-grade proximal arterial stenosis. Electronically Signed   By: Bascom Lily D.O.   On: 12/09/2023 09:55   CT HEAD CODE STROKE WO CONTRAST Result Date: 12/09/2023 CLINICAL DATA:  Code stroke. Provided history: Neuro deficit, acute, stroke suspected. EXAM: CT HEAD WITHOUT CONTRAST TECHNIQUE: Contiguous axial images were obtained from the base of the skull through the vertex without intravenous contrast. RADIATION DOSE REDUCTION: This exam was performed according to the departmental dose-optimization program which includes automated exposure control, adjustment of the mA and/or kV according to patient size and/or use of iterative reconstruction technique. COMPARISON:  Head CT 04/01/2020. FINDINGS: Brain: Cerebral volume is normal. Partially empty sella turcica. There is no acute intracranial hemorrhage. No demarcated cortical infarct. No extra-axial fluid  collection. No evidence of an intracranial mass. No midline shift. Vascular: No hyperdense vessel. Skull: No calvarial fracture or aggressive osseous lesion. Sinuses/Orbits: No mass or acute finding within the imaged orbits. No significant paranasal sinus disease at the imaged levels. ASPECTS [facility] Stroke Program Early CT Score) - Ganglionic level infarction (caudate, lentiform nuclei, internal capsule, insula, M1-M3 cortex): 7 - Supraganglionic infarction (M4-M6 cortex): 3 Total score (0-10 with 10 being normal): 10 No evidence of an acute intracranial abnormality. These results were communicated to Dr. Cleone Dad At 9:29 amon 6/2/2025by text page via the [facility] messaging system. IMPRESSION: No evidence of an acute intracranial abnormality. ASPECTS is 10 Electronically Signed   By: Bascom Lily D.O.   On: 12/09/2023 09:29    Microbiology: Results for orders placed or performed in visit on 08/10/19  Novel Coronavirus, NAA ([facility])     Status: None   Collection Time: 08/10/19  8:12 AM   Specimen: Oropharyngeal(OP) collection in  vial transport medium   OROPHARYNGEA  TESTING  Result Value Ref Range Status   SARS-CoV-2, NAA Not Detected Not Detected Final    Comment: This nucleic acid amplification test was developed and its performance characteristics determined by [employer]. Nucleic acid amplification tests include RT-PCR and TMA. This test has not been FDA cleared or approved. This test has been authorized by FDA under an Emergency Use Authorization (EUA). This test is only authorized for the duration of time the declaration that circumstances exist justifying the authorization of the emergency use of in vitro diagnostic tests for detection of SARS-CoV-2 virus and/or diagnosis of COVID-19 infection under section 564(b)(1) of the Act, 21 U.S.C. 604VWU-9(W) (1), unless the authorization is terminated or revoked sooner. When diagnostic testing is negative, the possibility of a  false negative result should be considered in the context of a patient's recent exposures and the presence of clinical signs and symptoms consistent with COVID-19. An individual without symptoms of COVID-19 and who is not shedding SARS-CoV-2 virus wo uld expect to have a negative (not detected) result in this assay.     Labs: CBC: Recent Labs  Lab 12/09/23 0908 12/11/23 0326  WBC 4.1 3.8*  NEUTROABS 2.5  --   HGB 13.8 13.0  HCT 40.3 39.4  MCV 90.2 91.4  PLT 231 221   Basic Metabolic Panel: Recent Labs  Lab 12/09/23 0908 12/10/23 0318 12/11/23 0326  NA 141 140 140  K 3.4* 3.9 3.9  CL 106 107 106  CO2 27 26 26   GLUCOSE 109* 105* 92  BUN 10 10 16   CREATININE 0.76 0.64 0.69  CALCIUM 9.4 9.0 9.1   Liver Function Tests: Recent Labs  Lab 12/09/23 0908 12/10/23 0318 12/11/23 0326  AST 451* 225* 121*  ALT 495* 396* 297*  ALKPHOS 144* 134* 142*  BILITOT 1.4* 0.5 0.6  PROT 7.2 6.5 6.8  ALBUMIN 3.8 3.3* 3.5   CBG: Recent Labs  Lab 12/10/23 0614 12/10/23 1142 12/10/23 1614 12/10/23 1944 12/11/23 0818  GLUCAP 90 73 76 114* 92    Discharge time spent: greater than 30 minutes.  Signed: [name], MD [facility] 12/11/2023

## 2023-12-11 NOTE — Hospital Course (Signed)
 58 y.o. female with medical history significant of obesity status post gastric bypass surgery, chronic insomnia, presented with acute confusion.   Patient is confused and unable to provide any history, history provided by patient's husband and 2 sons at bedside.  Family reported that the patient has a chronic insomnia and taking prescription medications for sleep.  Last night patient felt headache and took 1 Tylenol  and went to sleep.  This morning patient woke up and received a phone call from one of the relatives who told her that one of her uncle passed away last night.  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  Family reported that when talking to her patient appeared to be very confused and forgetful and kept asking questions in circle "Why am I here" "What day is today" and thinking of her mother other than uncle has just passed away. Her mother however passed away long time ago.   ED Course: Afebrile, nontachycardic blood pressure 118/88 O2 saturation 100% on room air.  Code stroke was called for appearingly aphasia on arrival.  CT head and CTA negative for acute findings or LVO.  MRI showed punctate stroke of the right cerebellar.  Blood work showed AST 450, ALT 495.  Bilirubin 1.4 INR 0.9 sodium 140 potassium 3.4 BUN 10 creatinine 0.7 glucose 109.  6/2.  MRI of the brain shows punctate acute infarct within the right cerebellar hemisphere.  CT angiogram did not show any large vessel occlusion.  EEG normal limits. 6/4.  echocardiogram shows an EF of 60 to 65%.  Patient feeling much better than when she came in.  Stable to go home.  Patient okay with starting enteric-coated aspirin for stroke prevention.  Hold off on Lipitor secondary to elevated liver function test.  Consider starting as outpatient.

## 2023-12-11 NOTE — Progress Notes (Signed)
*  PRELIMINARY RESULTS* Echocardiogram 2D Echocardiogram has been performed.  Laura Murray 12/11/2023, 7:58 AM

## 2023-12-11 NOTE — Assessment & Plan Note (Addendum)
 Amitriptyline  switched over to trazodone as needed.

## 2023-12-11 NOTE — Assessment & Plan Note (Signed)
 Right cerebellum.  Patient not having any incoordination or balance issues.  Feels okay.  Patient okay starting enteric-coated aspirin on a daily basis along with proton pump inhibitor.  Hold off on Lipitor secondary to elevated liver function tests.  Consider starting as outpatient.  LDL 81.

## 2023-12-11 NOTE — Assessment & Plan Note (Signed)
 Hold off on Wegovy 

## 2023-12-11 NOTE — Assessment & Plan Note (Addendum)
 Resolved.  Could be secondary to stroke versus medications.  Low sugar could have been contributing.

## 2023-12-11 NOTE — Progress Notes (Signed)
Per Dr Renae Gloss, dc tele monitoring

## 2023-12-11 NOTE — TOC Transition Note (Signed)
 Transition of Care [facility]) - Discharge Note   Patient Details  Name: Laura Murray MRN: [id number] Date of Birth: 1966/05/14  Transition of Care [facility]) CM/SW Contact:  Crayton Docker, RN 12/11/2023, 11:58 AM   Clinical Narrative:     Discharge orders noted to home/self care. Private transportation arranged. No identified needs.   Final next level of care: Home/Self Care Barriers to Discharge: No Barriers Identified   Patient Goals and CMS Choice    Home/self care  Discharge Placement               Home/self care   Discharge Plan and Services Additional resources added to the After Visit Summary for       Social Drivers of Health (SDOH) Interventions SDOH Screenings   Food Insecurity: No Food Insecurity (12/09/2023)  Housing: Low Risk  (12/09/2023)  Transportation Needs: No Transportation Needs (12/09/2023)  Utilities: Not At Risk (12/09/2023)  Alcohol Screen: Low Risk  (08/17/2022)  Depression (PHQ2-9): Low Risk  (08/17/2022)  Financial Resource Strain: Low Risk  (06/19/2023)   Received from [facility]  Stress: No Stress Concern Present (06/19/2023)   Received from [facility]  Tobacco Use: Low Risk  (12/09/2023)     Readmission Risk Interventions     No data to display

## 2023-12-12 ENCOUNTER — Telehealth: Payer: Self-pay

## 2023-12-12 ENCOUNTER — Encounter: Payer: Self-pay | Admitting: Family Medicine

## 2023-12-12 NOTE — Transitions of Care (Post Inpatient/ED Visit) (Signed)
   12/12/2023  Name: Laura Murray MRN: [id number] DOB: [birth year]  Today's TOC FU Call Status: Today's TOC FU Call Status:: Successful TOC FU Call Completed TOC FU Call Complete Date: 12/12/23 Patient's Name and Date of Birth confirmed.  Transition Care Management Follow-up Telephone Call Date of Discharge: 12/11/23 Discharge Facility: [facility]) Type of Discharge: Inpatient Admission Primary Inpatient Discharge Diagnosis:: encephapathy How have you been since you were released from the hospital?: Better Any questions or concerns?: Yes Patient Questions/Concerns:: right lower leg numbness Patient Questions/Concerns Addressed: Notified Provider of Patient Questions/Concerns  Items Reviewed: Did you receive and understand the discharge instructions provided?: Yes Medications obtained,verified, and reconciled?: Yes (Medications Reviewed) Any new allergies since your discharge?: No Dietary orders reviewed?: Yes Do you have support at home?: Yes People in Home [RPT]: spouse  Medications Reviewed Today: Medications Reviewed Today     Reviewed by Darrall Ellison, LPN (Licensed Practical Nurse) on 12/12/23 at 0935  Med List Status: <None>   Medication Order Taking? Sig Documenting Provider Last Dose Status Informant  acetaminophen  (TYLENOL ) 500 MG tablet [id number] No Take 1,000 mg by mouth every 6 (six) hours as needed for moderate pain or headache.  [provider] Taking Active Spouse/Significant Other, Child, Family Member, Pharmacy Records           Med Note [name], [place]   Mon Dec 09, 2023 11:54 AM) prn  aspirin EC 81 MG tablet [id number]  Take 1 tablet (81 mg total) by mouth daily. Swallow whole. [name], MD  Active   pantoprazole (PROTONIX) 40 MG tablet [id number]  Take 1 tablet (40 mg total) by mouth daily. [name], MD  Active   traZODone (DESYREL) 50 MG tablet [id number]  Take 0.5 tablets (25 mg total) by mouth at  bedtime as needed for sleep. [name], MD  Active   Vitamin D , Ergocalciferol , (DRISDOL ) 1.25 MG (50000 UNIT) CAPS capsule [id number] No TAKE 1 CAPSULE ONCE WEEKLY [name], [name], MD Past Month Active Spouse/Significant Other, Child, Family Member, Pharmacy Records            Home Care and Equipment/Supplies: Were Home Health Services Ordered?: NA Any new equipment or medical supplies ordered?: NA  Functional Questionnaire: Do you need assistance with bathing/showering or dressing?: No Do you need assistance with meal preparation?: No Do you need assistance with eating?: No Do you have difficulty maintaining continence: No Do you need assistance with getting out of bed/getting out of a chair/moving?: No Do you have difficulty managing or taking your medications?: No  Follow up appointments reviewed: PCP Follow-up appointment confirmed?: Yes Date of PCP follow-up appointment?: 12/20/23 Follow-up Provider: [facility] Follow-up appointment confirmed?: NA Do you need transportation to your follow-up appointment?: No Do you understand care options if your condition(s) worsen?: Yes-patient verbalized understanding    SIGNATURE Darrall Ellison, LPN [facility] Nurse Health Advisor Direct Dial [phone]

## 2023-12-13 NOTE — Telephone Encounter (Signed)
 She can have one of the same day slots on Monday. Also, should be taking either 81mg  aspirin or Plavix due to finding of old stroke. Please verify is she is taking aspirin that was prescribed at discharge

## 2023-12-16 ENCOUNTER — Encounter: Payer: Self-pay | Admitting: Family Medicine

## 2023-12-16 ENCOUNTER — Ambulatory Visit: Admitting: Family Medicine

## 2023-12-16 ENCOUNTER — Encounter: Payer: Self-pay | Admitting: Internal Medicine

## 2023-12-16 VITALS — BP 126/64 | HR 72 | Ht 63.0 in | Wt 150.3 lb

## 2023-12-16 DIAGNOSIS — G4700 Insomnia, unspecified: Secondary | ICD-10-CM

## 2023-12-16 DIAGNOSIS — N1330 Unspecified hydronephrosis: Secondary | ICD-10-CM | POA: Diagnosis not present

## 2023-12-16 DIAGNOSIS — G454 Transient global amnesia: Secondary | ICD-10-CM

## 2023-12-16 DIAGNOSIS — R7989 Other specified abnormal findings of blood chemistry: Secondary | ICD-10-CM

## 2023-12-16 DIAGNOSIS — G8929 Other chronic pain: Secondary | ICD-10-CM

## 2023-12-16 DIAGNOSIS — R519 Headache, unspecified: Secondary | ICD-10-CM | POA: Diagnosis not present

## 2023-12-16 NOTE — Progress Notes (Addendum)
 Established patient visit   Patient: Laura Murray   DOB: 10-Mar-1966   58 y.o. Female  MRN: [id number] Visit Date: 12/16/2023  Today's healthcare provider: Nancyann Perry, MD   Chief Complaint  Patient presents with   Hospitalization Follow-up    Admit date: 12/09/2023 Discharge date: 12/11/23 Dx: Acute stroke due to ischemia   Subjective    Discussed the use of AI scribe software for clinical note transcription with the patient, who gave verbal consent to proceed.  History of Present Illness   Laura Murray is a 58 year old female who presents for follow up hospitalization for episode of confusion and transient amnesia.  She was hospitalized for three days starting on December 09, 2023, due to confusion. She does not recall the events leading to her hospitalization, including driving to work. An MRI was performed, which showed acute punctate right cerebellar CVA. She was seen by neurology who did not feel that her symptoms were consistent with CVA seen on MRI. she has not had any neurological sx with cerebella CVA. She has been experiencing headaches located in the front of her head, which have been ongoing for a while. She takes Tylenol  for relief, noting that she took it on a Sunday when she felt unwell at church. She went home, lay down, and did not get up until the next morning.  She was previously on amitriptyline  for sleep issues but was switched to trazodone . She mentions not sleeping well for years and took amitriptyline  infrequently, about three times a week. Her liver function tests were elevated during her hospitalization. She has been taking Tylenol  frequently due to headaches. She also mentions a history of kidney issues, with her kidney being full of fluid.  She reports frequent urination without increased fluid intake, which has been occurring for about four months. She drinks more diet soda than water and has recently started consuming energy  drinks.  Since returning from the hospital, she has experienced numbness in her leg, which she initially attributed to lying on it for too long. However, the numbness has persisted. No dizziness or wooziness, but persistent headaches.       Medications: Outpatient Medications Prior to Visit  Medication Sig   acetaminophen  (TYLENOL ) 500 MG tablet Take 1,000 mg by mouth every 6 (six) hours as needed for moderate pain or headache.    aspirin  EC 81 MG tablet Take 1 tablet (81 mg total) by mouth daily. Swallow whole.   pantoprazole  (PROTONIX ) 40 MG tablet Take 1 tablet (40 mg total) by mouth daily.   traZODone  (DESYREL ) 50 MG tablet Take 0.5 tablets (25 mg total) by mouth at bedtime as needed for sleep.   Vitamin D , Ergocalciferol , (DRISDOL ) 1.25 MG (50000 UNIT) CAPS capsule TAKE 1 CAPSULE ONCE WEEKLY   No facility-administered medications prior to visit.   Review of Systems  Constitutional:  Negative for appetite change, chills, fatigue and fever.  Respiratory:  Negative for chest tightness and shortness of breath.   Cardiovascular:  Negative for chest pain and palpitations.  Gastrointestinal:  Negative for abdominal pain, nausea and vomiting.  Neurological:  Negative for dizziness and weakness.       Objective    BP 126/64 (BP Location: Left Arm, Patient Position: Sitting, Cuff Size: Normal)   Pulse 72   Ht 5' 3 (1.6 m)   Wt 150 lb 4.8 oz (68.2 kg)   LMP 02/26/2018   SpO2 100%   BMI 26.62 kg/m  Physical Exam   General: Appearance:    Well developed, well nourished female in no acute distress  Eyes:    PERRL, conjunctiva/corneas clear, EOM's intact       Lungs:     Clear to auscultation bilaterally, respirations unlabored  Heart:    Normal heart rate. Normal rhythm. No murmurs, rubs, or gallops.    MS:   All extremities are intact.    Neurologic:   Awake, alert, oriented x 3. No apparent focal neurological defect.        Assessment & Plan     Punctate acute right  cerebellar CVA on MRI 12-09-2023    Confusion and Memory Loss Recent confusion and memory loss linked to tiny strokes, medication interactions, and possible viral infection. Elevated WBC suggests infection. - Recheck blood work for liver function and infections. - Follow up with renal ultrasound. - Consider urology referral if renal ultrasound shows issues.  Elevated Liver Enzymes Liver enzymes elevated, decreasing but not normalized. Possible causes: Tylenol , wegovy , amitriptyline , or transient viral infection. - Recheck liver function tests.  Headaches Persistent frontal headaches managed with Tylenol , possibly elevating liver enzymes. No dizziness or wooziness. - Advise limiting Tylenol  use, consider alternative pain management if persistent.  Insomnia Chronic insomnia managed with trazodone , safer than amitriptyline  due to overdose risks and urinary retention. - Continue trazodone  for insomnia.  Hydronephrosis Repeat renal ultrasound.  Refer urology if not significantly improved.       Nancyann Perry, MD  [facility] [phone] (phone) [phone] (fax)  [facility]

## 2023-12-17 LAB — COMPREHENSIVE METABOLIC PANEL WITH GFR
ALT: 120 IU/L — ABNORMAL HIGH (ref 0–32)
AST: 55 IU/L — ABNORMAL HIGH (ref 0–40)
Albumin: 4.2 g/dL (ref 3.8–4.9)
Alkaline Phosphatase: 174 IU/L — ABNORMAL HIGH (ref 44–121)
BUN/Creatinine Ratio: 19 (ref 9–23)
BUN: 15 mg/dL (ref 6–24)
Bilirubin Total: 0.2 mg/dL (ref 0.0–1.2)
CO2: 21 mmol/L (ref 20–29)
Calcium: 9.1 mg/dL (ref 8.7–10.2)
Chloride: 105 mmol/L (ref 96–106)
Creatinine, Ser: 0.78 mg/dL (ref 0.57–1.00)
Globulin, Total: 2.7 g/dL (ref 1.5–4.5)
Glucose: 71 mg/dL (ref 70–99)
Potassium: 4.1 mmol/L (ref 3.5–5.2)
Sodium: 142 mmol/L (ref 134–144)
Total Protein: 6.9 g/dL (ref 6.0–8.5)
eGFR: 89 mL/min/{1.73_m2} (ref >59)

## 2023-12-17 LAB — HEPATITIS A ANTIBODY, IGM: Hep A IgM: NEGATIVE

## 2023-12-17 NOTE — Telephone Encounter (Signed)
**Note De-identified  [place] Obfuscation** Please advise 

## 2023-12-19 ENCOUNTER — Encounter: Payer: Self-pay | Admitting: Internal Medicine

## 2023-12-20 ENCOUNTER — Inpatient Hospital Stay: Admitting: Family Medicine

## 2023-12-20 ENCOUNTER — Ambulatory Visit
Admission: RE | Admit: 2023-12-20 | Discharge: 2023-12-20 | Disposition: A | Discharge status: Home / Self Care | Source: Ambulatory Visit | Attending: Family Medicine | Admitting: Family Medicine

## 2023-12-20 DIAGNOSIS — N1330 Unspecified hydronephrosis: Secondary | ICD-10-CM | POA: Insufficient documentation

## 2023-12-22 ENCOUNTER — Ambulatory Visit: Payer: Self-pay | Admitting: Family Medicine

## 2023-12-22 DIAGNOSIS — N1330 Unspecified hydronephrosis: Secondary | ICD-10-CM

## 2023-12-22 DIAGNOSIS — E669 Obesity, unspecified: Secondary | ICD-10-CM

## 2023-12-22 DIAGNOSIS — Z8673 Personal history of transient ischemic attack (TIA), and cerebral infarction without residual deficits: Secondary | ICD-10-CM

## 2023-12-22 NOTE — Addendum Note (Signed)
 Addended by: Lamon Pillow on: 12/22/2023 07:05 PM   Modules accepted: Level of Service

## 2023-12-26 ENCOUNTER — Ambulatory Visit: Admitting: Urology

## 2023-12-26 VITALS — BP 138/73 | HR 69 | Ht 64.0 in | Wt 151.0 lb

## 2023-12-26 DIAGNOSIS — R350 Frequency of micturition: Secondary | ICD-10-CM

## 2023-12-26 DIAGNOSIS — N3281 Overactive bladder: Secondary | ICD-10-CM | POA: Diagnosis not present

## 2023-12-26 DIAGNOSIS — N1330 Unspecified hydronephrosis: Secondary | ICD-10-CM

## 2023-12-26 LAB — BLADDER SCAN AMB NON-IMAGING

## 2023-12-26 NOTE — Progress Notes (Signed)
 12/26/23 12:57 PM   Laura Murray [birth year] [id number]  CC: Bilateral hydronephrosis, urinary frequency/nocturia  HPI: 58 year old female referred for bilateral hydronephrosis seen on ultrasound.  She was recently hospitalized on 12/09/2023 with confusion after poor sleep and death of a family member, MRI brain showed a punctate acute infarct within the right cerebellar hemisphere, but per the neurology notes this did not correlate with her symptoms.  An abdominal ultrasound was performed and showed possible right hydronephrosis, a follow-up renal ultrasound 12/10/2023 suggested moderate bilateral hydronephrosis with a decompressed bladder.  Renal function has been normal, most recently creatinine 0.78, EGFR greater than 60 from 12/16/2023 which is unchanged over the last few years.  PCP ordered a follow-up renal ultrasound 12/20/2023 that showed persistent moderate hydronephrosis bilaterally, similar to prior, and postvoid residual was normal at 12ml.  She denies any abdominal or flank pain.  She has had 4 to 5 months of urinary frequency 15-20 times per day, nocturia 2-3 times per night.  She drinks primarily diet Sunkist during the day.  She also complains of some intermittent headaches she associates with the urge to urinate.  Multiple urine samples have been benign with no microscopic hematuria or infection.  She denies any gross hematuria.  PVR today normal at 2ml.  PMH: Past Medical History:  Diagnosis Date   Acute metabolic encephalopathy 12/11/2023   Acute stroke due to ischemia (HCC) 12/09/2023   Anemia    Insomnia    Obesity    Ovarian cyst    Upper GI bleeding 2015   gastric ulcer    Surgical History: Past Surgical History:  Procedure Laterality Date   BREAST CYST ASPIRATION Left 02/02/2014   Dr Marquita Situ   BREAST CYST ASPIRATION Right 02/2018   CHOLECYSTECTOMY  2009   COLONOSCOPY  2014   GASTRIC BYPASS  2006, 2015   2015 revision   GASTRIC RESECTION  2015    SHOULDER ARTHROSCOPY WITH OPEN ROTATOR CUFF REPAIR Right 03/27/2018   Procedure: SHOULDER ARTHROSCOPY WITH OPEN ROTATOR CUFF REPAIR;  Surgeon: Elner Hahn, MD;  Location: [facility] ORS;  Service: Orthopedics;  Laterality: Right;   SHOULDER CLOSED REDUCTION Right 07/08/2018   Procedure: CLOSED MANIPULATION SHOULDER;  Surgeon: Elner Hahn, MD;  Location: [facility] ORS;  Service: Orthopedics;  Laterality: Right;   STERIOD INJECTION Right 07/08/2018   Procedure: STEROID INJECTION OF RIGHT SHOULDER;  Surgeon: Elner Hahn, MD;  Location: [facility] ORS;  Service: Orthopedics;  Laterality: Right;   TONSILLECTOMY     TUBAL LIGATION     UPPER GI ENDOSCOPY      Family History: Family History  Problem Relation Age of Onset   Lung cancer Maternal Grandfather    Cancer Maternal Grandfather    Diabetes Mother    Hypertension Mother    Diabetes Father    Hypertension Father    Crohn's disease Son    Rheum arthritis Son    Asthma Son    Cancer Maternal Grandmother    Breast cancer Maternal Aunt        [phone]   Colon cancer Maternal Uncle     Social History:  reports that she has never smoked. She has never used smokeless tobacco. She reports that she does not drink alcohol and does not use drugs.  Physical Exam: LMP 02/26/2018    Constitutional:  Alert and oriented, No acute distress. Cardiovascular: No clubbing, cyanosis, or edema. Respiratory: Normal respiratory effort, no increased work of breathing. GI: Abdomen is soft, nontender, nondistended, no  abdominal masses   Laboratory Data: Reviewed, see HPI, benign urinalysis, normal renal function  Pertinent Imaging: I have personally viewed and interpreted the renal ultrasound showing bilateral moderate hydronephrosis, decompressed bladder.  Assessment & Plan:   58 year old female with 5 to 6 months of urinary frequency and nocturia, as well as renal ultrasound x 2 from June 2025 showing bilateral moderate hydronephrosis, but normal bladder  emptying with PVR of 15ml.  I reviewed the hospitalization notes at length.  Etiology of hydronephrosis is unclear, no recent other imaging to review chronicity.  I recommended a CT urogram for further evaluation of bilateral hydronephrosis.  We discussed this could be a benign finding related to extrarenal pelvis bilaterally, or obstruction that would require further evaluation or treatment.  Reassuring is her normal renal function, and normal bladder emptying.  In terms of her urinary symptoms this may be related to her high intake of diet soda and I recommended drinking primarily water to see if her urinary symptoms improve, could consider an OAB medication at follow-up.  CT urogram for further evaluation of bilateral hydronephrosis on ultrasound, call with results Consider trial of OAB medication pending CT findings   Jay Meth, MD 12/26/2023  [facility] [address] [place], [place] [address] [phone]

## 2023-12-26 NOTE — Patient Instructions (Signed)
 Please call [phone] to set up appointment for CT Scan.

## 2024-01-02 ENCOUNTER — Ambulatory Visit
Admission: RE | Admit: 2024-01-02 | Discharge: 2024-01-02 | Disposition: A | Discharge status: Home / Self Care | Source: Ambulatory Visit | Attending: Urology | Admitting: Urology

## 2024-01-02 DIAGNOSIS — N1330 Unspecified hydronephrosis: Secondary | ICD-10-CM | POA: Diagnosis present

## 2024-01-02 MED ORDER — IOHEXOL 300 MG/ML  SOLN
100.0000 mL | Freq: Once | INTRAMUSCULAR | Status: AC | PRN
  Administered 2024-01-02: 100 mL via INTRAVENOUS

## 2024-01-02 MED ORDER — SODIUM CHLORIDE 0.9 % IV SOLN: INTRAVENOUS | Status: DC

## 2024-01-03 ENCOUNTER — Ambulatory Visit: Payer: Self-pay | Admitting: Urology

## 2024-01-15 ENCOUNTER — Telehealth: Payer: Self-pay

## 2024-01-15 ENCOUNTER — Other Ambulatory Visit: Payer: Self-pay

## 2024-01-15 DIAGNOSIS — Z8673 Personal history of transient ischemic attack (TIA), and cerebral infarction without residual deficits: Secondary | ICD-10-CM | POA: Insufficient documentation

## 2024-01-15 MED ORDER — WEGOVY 0.5 MG/0.5ML ~~LOC~~ SOAJ
0.5000 mg | SUBCUTANEOUS | 0 refills | Status: DC
Start: 2024-01-15 — End: 2024-01-29

## 2024-01-15 NOTE — Telephone Encounter (Signed)
 Copied from CRM [id number]. Topic: Clinical - Medication Prior Auth >> Jan 15, 2024 12:54 PM Tobias L wrote: Reason for CRM: Autumn with [employer] is calling to request a prior authorization for patient's wegovy .  Please assist patient further    PA needed for wegovy 

## 2024-01-16 MED ORDER — ASPIRIN 81 MG PO TBEC
81.0000 mg | DELAYED_RELEASE_TABLET | Freq: Every day | ORAL | 3 refills | Status: DC
Start: 2024-01-16 — End: 2024-06-01

## 2024-01-16 NOTE — Addendum Note (Signed)
 Addended by: GASPER NANCYANN BRAVO on: 01/16/2024 09:09 AM   Modules accepted: Orders

## 2024-01-17 ENCOUNTER — Other Ambulatory Visit (HOSPITAL_COMMUNITY): Payer: Self-pay

## 2024-01-17 ENCOUNTER — Encounter: Payer: Self-pay | Admitting: Internal Medicine

## 2024-01-17 NOTE — Telephone Encounter (Signed)
 Unfortunately, pt's BMI is too low to be approved for Wegovy  without evidence of any of the following: Myocardial infarction (MI), (2) Prior ischemic or hemorrhagic stroke, (3) Symptomatic peripheral arterial disease (PAD) as evidence by one of the following (a) Intermittent claudication with ankle-brachial index (ABI) less than 0.85 (at rest); (b) Peripheral arterial revascularization procedure; (c) Amputation due to atherosclerotic disease.   Pt doesn't meet current PA requirements at this time. No PA submitted, thank you.

## 2024-01-21 NOTE — Telephone Encounter (Signed)
 Copied from CRM [id number]. Topic: Clinical - Medication Prior Auth >> Jan 21, 2024 12:43 PM Delon DASEN wrote: Reason for CRM: Harlene with [employer]- they have not received any requests for PA for Semaglutide -Weight Management (WEGOVY ) 0.5 MG/0.5ML SOAJ- Utilization Mgmnt- [phone] option 3 then option 2   Has PA been started for this patient?

## 2024-01-21 NOTE — Telephone Encounter (Signed)
 She had an ischemic stroke in June of this year. Records are in epic. She meets the second criteria.

## 2024-01-21 NOTE — Telephone Encounter (Signed)
 Thank you for catching that! Currently working on the PA, however:  Please see clinical question(s) below that I am not finding the answer to in their chart and advise.

## 2024-01-22 NOTE — Telephone Encounter (Signed)
 Please read previous note as PA has been started, but an answer is required for PA submission.

## 2024-01-22 NOTE — Telephone Encounter (Signed)
 Sorry for the multiple messages!

## 2024-01-23 NOTE — Telephone Encounter (Signed)
 She has been participated in [employer] for a couple of years before she was first prescribed Wegovy  in 2023. You put 24 months of lifestyle modifications programs.

## 2024-01-24 ENCOUNTER — Telehealth: Payer: Self-pay

## 2024-01-24 NOTE — Telephone Encounter (Signed)
 Pharmacy Patient Advocate Encounter   Received notification from CoverMyMeds that prior authorization for Wegovy  0.5MG /0.5ML auto-injectors is required/requested.   Insurance verification completed.   The patient is insured through [facility] .   Per test claim: PA required; PA submitted to above mentioned insurance via CoverMyMeds Key/confirmation #/EOC [id number] Status is pending

## 2024-01-24 NOTE — Telephone Encounter (Signed)
Thank you, PA has been submitted

## 2024-01-28 ENCOUNTER — Other Ambulatory Visit (HOSPITAL_COMMUNITY): Payer: Self-pay

## 2024-01-28 NOTE — Telephone Encounter (Signed)
 Pharmacy Patient Advocate Encounter  Received notification from [employer] that Prior Authorization for Wegovy  0.5MG /0.5ML auto-injectors  has been APPROVED from 11/24/2023 to 01/22/2025. Ran test claim, Copay is $24.99. This test claim was processed through [facility]- copay amounts may vary at other pharmacies due to pharmacy/plan contracts, or as the patient moves through the different stages of their insurance plan.   PA #/Case ID/Reference #: [id number]

## 2024-01-29 MED ORDER — WEGOVY 0.5 MG/0.5ML ~~LOC~~ SOAJ: 0.5000 mg | SUBCUTANEOUS | 0 refills | Status: DC

## 2024-01-29 MED ORDER — WEGOVY 1.7 MG/0.75ML ~~LOC~~ SOAJ: 1.7000 mg | SUBCUTANEOUS | 0 refills | Status: DC

## 2024-01-29 MED ORDER — WEGOVY 1 MG/0.5ML ~~LOC~~ SOAJ: 1.0000 mg | SUBCUTANEOUS | 0 refills | Status: DC

## 2024-01-29 NOTE — Addendum Note (Signed)
 Addended by: GASPER NANCYANN BRAVO on: 01/29/2024 04:36 PM   Modules accepted: Orders

## 2024-02-05 ENCOUNTER — Telehealth: Payer: Self-pay

## 2024-02-05 NOTE — Telephone Encounter (Signed)
 Patient has been off of it for the last 2 months due to a medical issues. Is not starting back on it at lower dose, starting at.5mg  dose for 4 weeks, then 1mg  for 4 weeks, then 1.7 for 4 weeks.

## 2024-02-05 NOTE — Telephone Encounter (Signed)
 Called and notified pharmacy of dosage info.

## 2024-02-05 NOTE — Telephone Encounter (Signed)
 Copied from CRM [id number]. Topic: Clinical - Medication Question >> Feb 04, 2024  9:32 AM Turkey B wrote: Reason for CRM: Karleen from [facility] called in, states needs dosage info for Wegovy . Ref #[id number]

## 2024-02-12 ENCOUNTER — Telehealth: Payer: Self-pay | Admitting: Family Medicine

## 2024-02-12 NOTE — Telephone Encounter (Signed)
 Need to know all of the days he was out of work due to her illness.

## 2024-02-12 NOTE — Telephone Encounter (Signed)
 Patient dropped off [employer] Dis[employer] papers to be completed.  They are in Dr. Cleola mailbox.   Her husband has a policy at work where he can collect benefits from [employer] when he stayed out with [place] during her stroke.    Please fax forms when complete and call patient to pick up a copy of them when completed.  [phone]

## 2024-02-13 ENCOUNTER — Telehealth: Payer: Self-pay | Admitting: Family Medicine

## 2024-02-13 NOTE — Telephone Encounter (Signed)
 LVMTCB. Ok to verify dates per Dr.[name]

## 2024-02-13 NOTE — Telephone Encounter (Signed)
 Duplicate encounter     Closing this one.

## 2024-02-13 NOTE — Telephone Encounter (Unsigned)
 Copied from CRM #[id number]. Topic: General - Other >> Feb 13, 2024  2:27 PM Tiffini S wrote: Reason for CRM: Patient states that the information is for her husband Carldwell Marengo to get paid on his job 6/2 thru 12/13/23 for taking care of her- she returned to work on Monday  The patient call back number at work is: [phone] and she will be at work today until [employer]

## 2024-03-06 ENCOUNTER — Encounter: Payer: Self-pay | Admitting: Family Medicine

## 2024-03-06 ENCOUNTER — Ambulatory Visit (INDEPENDENT_AMBULATORY_CARE_PROVIDER_SITE_OTHER): Admitting: Family Medicine

## 2024-03-06 VITALS — BP 128/78 | HR 67 | Temp 98.7°F | Ht 64.0 in | Wt 156.6 lb

## 2024-03-06 DIAGNOSIS — G4700 Insomnia, unspecified: Secondary | ICD-10-CM

## 2024-03-06 DIAGNOSIS — Z8673 Personal history of transient ischemic attack (TIA), and cerebral infarction without residual deficits: Secondary | ICD-10-CM | POA: Diagnosis not present

## 2024-03-06 DIAGNOSIS — Z8639 Personal history of other endocrine, nutritional and metabolic disease: Secondary | ICD-10-CM | POA: Diagnosis not present

## 2024-03-06 DIAGNOSIS — N281 Cyst of kidney, acquired: Secondary | ICD-10-CM | POA: Diagnosis not present

## 2024-03-06 DIAGNOSIS — Z1211 Encounter for screening for malignant neoplasm of colon: Secondary | ICD-10-CM

## 2024-03-06 MED ORDER — TRAZODONE HCL 50 MG PO TABS: 50.0000 mg | ORAL_TABLET | Freq: Every evening | ORAL | 1 refills | Status: DC | PRN

## 2024-03-06 NOTE — Progress Notes (Signed)
 Established patient visit   Patient: Laura Murray   DOB: 12-07-1965   58 y.o. Female  MRN: [id number] Visit Date: 03/06/2024  Today's healthcare provider: Nancyann Perry, MD   Chief Complaint  Patient presents with   Follow-up    Patient reports she here for the fluid on my kidneys or cyst, talk about her stroke and weight loss medication.  Vaccines- declined Colonoscopy- Dr. Lovetta, was supposed to be set it up but never heard anything back. GYN- see Dr. Lovetta   Subjective    Discussed the use of AI scribe software for clinical note transcription with the patient, who gave verbal consent to proceed.  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 .  She has experienced weight gain, currently weighing 160 pounds, after being hospitalized when she weighed 150 pounds. She attributes this weight gain to being off Wegovy , which she is now taking at a dose of 0.5 mg. There were issues with prescription fulfillment, noting that a 1.7 mg dose was sent before the 1.5 mg dose, causing a delay in her treatment.   She is currently taking 1/2 x 50mg  trazodone  for sleep but reports inadequate sleep, only getting a couple of hours. She inquires about increasing her dose from half a tablet to a full tablet. She also mentions not taking a PPI prescribed for acid reflux, as she takes cortisol instead.  She recalls drinking sugar-free Monster drinks for about six months prior to her stroke, noting that she stopped after learning about potential risks associated with caffeine. She is concerned about memory issues post-stroke, affecting her work performance, though her boss has not noticed any significant problems.     Lab Results  Component Value Date   NA 142 12/16/2023   K 4.1 12/16/2023   CREATININE 0.78 12/16/2023   EGFR  89 12/16/2023   GLUCOSE 71 12/16/2023     Medications: Outpatient Medications Prior to Visit  Medication Sig   acetaminophen  (TYLENOL ) 500 MG tablet Take 1,000 mg by mouth every 6 (six) hours as needed for moderate pain or headache.    aspirin  EC 81 MG tablet Take 1 tablet (81 mg total) by mouth daily. Swallow whole.   Semaglutide -Weight Management (WEGOVY ) 0.5 MG/0.5ML SOAJ Inject 0.5 mg into the skin once a week.   Semaglutide -Weight Management (WEGOVY ) 1 MG/0.5ML SOAJ Inject 1 mg into the skin once a week.   Semaglutide -Weight Management (WEGOVY ) 1.7 MG/0.75ML SOAJ Inject 1.7 mg into the skin once a week.   Vitamin D , Ergocalciferol , (DRISDOL ) 1.25 MG (50000 UNIT) CAPS capsule TAKE 1 CAPSULE ONCE WEEKLY   [DISCONTINUED] traZODone  (DESYREL ) 50 MG tablet Take 0.5 tablets (25 mg total) by mouth at bedtime as needed for sleep.   pantoprazole  (PROTONIX ) 40 MG tablet Take 1 tablet (40 mg total) by mouth daily. (Patient not taking: Reported on 03/06/2024)   No facility-administered medications prior to visit.   Review of Systems  Constitutional:  Negative for appetite change, chills, fatigue and fever.  Respiratory:  Negative for chest tightness and shortness of breath.   Cardiovascular:  Negative for chest pain and palpitations.  Gastrointestinal:  Negative for abdominal pain, nausea and vomiting.  Neurological:  Negative for dizziness and weakness.       Objective    BP 128/78 (BP Location: Left Arm, Patient Position: Sitting)  Pulse 67   Temp 98.7 F (37.1 C) (Oral)   Ht 5' 4 (1.626 m)   Wt 156 lb 9.6 oz (71 kg)   LMP 02/26/2018   SpO2 100%   BMI 26.88 kg/m   Physical Exam    General appearance: Well developed, well nourished female, cooperative and in no acute distress Head: Normocephalic, without obvious abnormality, atraumatic Respiratory: Respirations even and unlabored, normal respiratory rate Extremities: All extremities are intact.  Skin: Skin color, texture,  turgor normal. No rashes seen  Psych: Appropriate mood and affect. Neurologic: Mental status: Alert, oriented to person, place, and time, thought content appropriate.    Assessment & Plan     1. Renal cysts, acquired, bilateral (Primary) Reassured of typically benign nature of these cysts that were elucidated on CT scan. She does not have hydronephrosis. Plan on checking eGFR once or twice a year.   2. Colon cancer screening  - Ambulatory referral to Gastroenterology  3. obesity Had done well on maintenance dose of Wegovy  but stopped for several weeks after hospitalization for transient global amnesia. Has since restarted titration now on 0.5 and anticipates titrating up to 1.7.   4. History of cerebrovascular accident (CVA) in adulthood Likely incidental finding during workup for episode of transient global amnesia in April.    5. Insomnia Increase traZODone  (DESYREL ) 50 MG tablet; to Take 1-2 tablets (50-100 mg total) by mouth at bedtime as needed for sleep.  Dispense: 90 tablet; Refill: 1  Return in about 4 months (around 07/06/2024).     Nancyann Perry, MD  [facility] [phone] (phone) [phone] (fax)  [facility]

## 2024-03-06 NOTE — Patient Instructions (Signed)
 Laura Murray  Please review the attached list of medications and notify my office if there are any errors.   . Please bring all of your medications to every appointment so we can make sure that our medication list is the same as yours.

## 2024-03-13 ENCOUNTER — Inpatient Hospital Stay: Attending: Internal Medicine

## 2024-03-13 DIAGNOSIS — E538 Deficiency of other specified B group vitamins: Secondary | ICD-10-CM | POA: Diagnosis present

## 2024-03-13 DIAGNOSIS — D500 Iron deficiency anemia secondary to blood loss (chronic): Secondary | ICD-10-CM | POA: Diagnosis present

## 2024-03-13 DIAGNOSIS — Z9884 Bariatric surgery status: Secondary | ICD-10-CM | POA: Diagnosis not present

## 2024-03-13 DIAGNOSIS — Z800 Family history of malignant neoplasm of digestive organs: Secondary | ICD-10-CM | POA: Diagnosis not present

## 2024-03-13 DIAGNOSIS — N920 Excessive and frequent menstruation with regular cycle: Secondary | ICD-10-CM | POA: Diagnosis present

## 2024-03-13 DIAGNOSIS — R5383 Other fatigue: Secondary | ICD-10-CM | POA: Insufficient documentation

## 2024-03-13 DIAGNOSIS — Z803 Family history of malignant neoplasm of breast: Secondary | ICD-10-CM | POA: Diagnosis not present

## 2024-03-13 DIAGNOSIS — D508 Other iron deficiency anemias: Secondary | ICD-10-CM

## 2024-03-13 DIAGNOSIS — Z801 Family history of malignant neoplasm of trachea, bronchus and lung: Secondary | ICD-10-CM | POA: Insufficient documentation

## 2024-03-13 LAB — CBC WITH DIFFERENTIAL (CANCER CENTER ONLY)
Abs Immature Granulocytes: 0.02 K/uL (ref 0.00–0.07)
Basophils Absolute: 0 K/uL (ref 0.0–0.1)
Basophils Relative: 0 %
Eosinophils Absolute: 0.2 K/uL (ref 0.0–0.5)
Eosinophils Relative: 4 %
HCT: 39.4 % (ref 36.0–46.0)
Hemoglobin: 13.2 g/dL (ref 12.0–15.0)
Immature Granulocytes: 0 %
Lymphocytes Relative: 32 %
Lymphs Abs: 1.5 K/uL (ref 0.7–4.0)
MCH: 30.7 pg (ref 26.0–34.0)
MCHC: 33.5 g/dL (ref 30.0–36.0)
MCV: 91.6 fL (ref 80.0–100.0)
Monocytes Absolute: 0.3 K/uL (ref 0.1–1.0)
Monocytes Relative: 7 %
Neutro Abs: 2.6 K/uL (ref 1.7–7.7)
Neutrophils Relative %: 57 %
Platelet Count: 230 K/uL (ref 150–400)
RBC: 4.3 MIL/uL (ref 3.87–5.11)
RDW: 11.8 % (ref 11.5–15.5)
WBC Count: 4.5 K/uL (ref 4.0–10.5)
nRBC: 0 % (ref 0.0–0.2)

## 2024-03-13 LAB — BASIC METABOLIC PANEL WITH GFR
Anion gap: 9 (ref 5–15)
BUN: 16 mg/dL (ref 6–20)
CO2: 25 mmol/L (ref 22–32)
Calcium: 8.6 mg/dL — ABNORMAL LOW (ref 8.9–10.3)
Chloride: 103 mmol/L (ref 98–111)
Creatinine, Ser: 0.88 mg/dL (ref 0.44–1.00)
GFR, Estimated: >60 mL/min (ref >60)
Glucose, Bld: 93 mg/dL (ref 70–99)
Potassium: 3.8 mmol/L (ref 3.5–5.1)
Sodium: 137 mmol/L (ref 135–145)

## 2024-03-13 LAB — IRON AND TIBC
Iron: 84 ug/dL (ref 28–170)
Saturation Ratios: 22 % (ref 10.4–31.8)
TIBC: 388 ug/dL (ref 250–450)
UIBC: 304 ug/dL

## 2024-03-13 LAB — VITAMIN B12: Vitamin B-12: 191 pg/mL (ref 180–914)

## 2024-03-13 LAB — FERRITIN: Ferritin: 83 ng/mL (ref 11–307)

## 2024-03-13 LAB — VITAMIN D 25 HYDROXY (VIT D DEFICIENCY, FRACTURES): Vit D, 25-Hydroxy: 73.03 ng/mL (ref 30–100)

## 2024-03-20 ENCOUNTER — Inpatient Hospital Stay (HOSPITAL_BASED_OUTPATIENT_CLINIC_OR_DEPARTMENT_OTHER): Admitting: Internal Medicine

## 2024-03-20 ENCOUNTER — Encounter: Payer: Self-pay | Admitting: Internal Medicine

## 2024-03-20 ENCOUNTER — Inpatient Hospital Stay

## 2024-03-20 VITALS — BP 145/68 | HR 67 | Temp 98.9°F | Resp 16 | Ht 64.0 in | Wt 156.1 lb

## 2024-03-20 DIAGNOSIS — D508 Other iron deficiency anemias: Secondary | ICD-10-CM | POA: Diagnosis not present

## 2024-03-20 DIAGNOSIS — E538 Deficiency of other specified B group vitamins: Secondary | ICD-10-CM | POA: Diagnosis not present

## 2024-03-20 DIAGNOSIS — D500 Iron deficiency anemia secondary to blood loss (chronic): Secondary | ICD-10-CM

## 2024-03-20 MED ORDER — CYANOCOBALAMIN 1000 MCG/ML IJ SOLN
1000.0000 ug | Freq: Once | INTRAMUSCULAR | Status: AC
  Administered 2024-03-20: 1000 ug via INTRAMUSCULAR

## 2024-03-20 NOTE — Assessment & Plan Note (Signed)
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 Again reminded on barimelt.   # Hemoglobin is 12.08 September 2023- I sat 20%, ferrint 127 but with gastric malabsorption- pending insurance Proceed  IV ferrahem.   # B12 deficiency- B12 injection q 46M. Reminded b12 pills at home.   # Hx of Stroke [confusion-Punctate acute infarct within the right cerebellar hemisphere -ischemic- June 2025]- on asprin 81/day-   1 week- labs prior sec to insurance  # DISPOSITION: # Note for work # B12 injection today # HOLD  Ferrahem today-  # Follow up in 6 months-MD;  1 week prior-labs cbc; bmp; vit D 25-OH levels; b12 levels/ferritin/iron  studies; possible ferrahem/ b12 injection-Dr.B  Cc;Dr.[name].

## 2024-03-20 NOTE — Progress Notes (Signed)
 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 raynauds' s/p rheuma eval.   INTERVAL HISTORY: Patient ambulating-independently. Alone.  A very pleasant 58 year-old female patient with above history of iron  deficiency anemia secondary to gastric bypass- is here for follow-up.  Pt states she had a stroke in June 2025. She is concerned about the cyst around her kidney and liver.   No dizziness. No visible blood in stool.  Patient unfortunately not compliant with her oral B12. Patient denies any blood in stools or black or stools.   Review of Systems  Constitutional:  Positive for malaise/fatigue. Negative for chills, diaphoresis, fever and weight loss.  HENT:  Negative for nosebleeds and sore throat.   Eyes:  Negative for double vision.  Respiratory:  Negative for cough, hemoptysis, sputum production, shortness of breath and wheezing.   Cardiovascular:  Negative for chest pain, palpitations, orthopnea and leg swelling.  Gastrointestinal:  Negative for abdominal pain, blood in stool, constipation, diarrhea, heartburn, melena, nausea and vomiting.  Genitourinary:  Negative for dysuria, frequency and urgency.  Musculoskeletal:  Negative for back pain and joint pain.  Skin: Negative.  Negative for itching and rash.  Neurological:  Negative for dizziness, tingling, focal weakness, weakness and headaches.  Endo/Heme/Allergies:  Does not bruise/bleed  easily.  Psychiatric/Behavioral:  Negative for depression. The patient has insomnia. The patient is not nervous/anxious.    SABRA   PAST MEDICAL HISTORY :  Past Medical History:  Diagnosis Date   Acute metabolic encephalopathy 12/11/2023   Acute stroke due to ischemia (HCC) 12/09/2023   Anemia    Insomnia    Obesity    Ovarian cyst    Upper GI bleeding 2015   gastric ulcer    PAST SURGICAL HISTORY :   Past Surgical History:  Procedure Laterality Date   BREAST CYST ASPIRATION Left 02/02/2014   Dr Dessa   BREAST CYST ASPIRATION Right 02/2018   CHOLECYSTECTOMY  2009   COLONOSCOPY  2014   GASTRIC BYPASS  2006, 2015   2015 revision   GASTRIC RESECTION  2015   SHOULDER ARTHROSCOPY WITH OPEN ROTATOR CUFF REPAIR Right 03/27/2018   Procedure: SHOULDER ARTHROSCOPY WITH OPEN ROTATOR CUFF REPAIR;  Surgeon: Edie Norleen PARAS, MD;  Location: [facility];  Service: Orthopedics;  Laterality: Right;   SHOULDER CLOSED REDUCTION Right 07/08/2018   Procedure: CLOSED MANIPULATION SHOULDER;  Surgeon: Edie Norleen PARAS, MD;  Location: [facility];  Service: Orthopedics;  Laterality: Right;   STERIOD INJECTION Right 07/08/2018   Procedure: STEROID INJECTION OF RIGHT SHOULDER;  Surgeon: Edie Norleen PARAS, MD;  Location: [facility];  Service: Orthopedics;  Laterality: Right;   TONSILLECTOMY     TUBAL LIGATION     UPPER GI ENDOSCOPY      FAMILY HISTORY :   Family History  Problem Relation Age of Onset   Lung cancer Maternal Grandfather    Cancer Maternal Grandfather    Diabetes Mother    Hypertension Mother    Diabetes  Father    Hypertension Father    Crohn's disease Son    Rheum arthritis Son    Asthma Son    Cancer Maternal Grandmother    Breast cancer Maternal Aunt        [phone]   Colon cancer Maternal Uncle     SOCIAL HISTORY:   Social History   Tobacco Use   Smoking status: Never   Smokeless tobacco: Never  Vaping Use   Vaping status: Never Used  Substance Use Topics   Alcohol use: No    Drug use: No    ALLERGIES:  is allergic to nsaids.  MEDICATIONS:  Current Outpatient Medications  Medication Sig Dispense Refill   acetaminophen  (TYLENOL ) 500 MG tablet Take 1,000 mg by mouth every 6 (six) hours as needed for moderate pain or headache.      aspirin  EC 81 MG tablet Take 1 tablet (81 mg total) by mouth daily. Swallow whole. 90 tablet 3   Semaglutide -Weight Management (WEGOVY ) 0.5 MG/0.5ML SOAJ Inject 0.5 mg into the skin once a week. 2 mL 0   Semaglutide -Weight Management (WEGOVY ) 1 MG/0.5ML SOAJ Inject 1 mg into the skin once a week. 2 mL 0   Semaglutide -Weight Management (WEGOVY ) 1.7 MG/0.75ML SOAJ Inject 1.7 mg into the skin once a week. 3 mL 0   traZODone  (DESYREL ) 50 MG tablet Take 1-2 tablets (50-100 mg total) by mouth at bedtime as needed for sleep. 90 tablet 1   Vitamin D , Ergocalciferol , (DRISDOL ) 1.25 MG (50000 UNIT) CAPS capsule TAKE 1 CAPSULE ONCE WEEKLY 12 capsule 3   No current facility-administered medications for this visit.   Facility-Administered Medications Ordered in Other Visits  Medication Dose Route Frequency Provider Last Rate Last Admin   cyanocobalamin  (VITAMIN B12) injection 1,000 mcg  1,000 mcg Intramuscular Once Davontay Watlington R, MD        PHYSICAL EXAMINATION: ECOG PERFORMANCE STATUS: 0 - Asymptomatic  BP (!) 145/68 (BP Location: Left Arm, Patient Position: Sitting, Cuff Size: Large)   Pulse 67   Temp 98.9 F (37.2 C) (Tympanic)   Resp 16   Ht 5' 4 (1.626 m)   Wt 156 lb 1.6 oz (70.8 kg)   LMP 02/26/2018   SpO2 100%   BMI 26.79 kg/m   Filed Weights   03/20/24 1256  Weight: 156 lb 1.6 oz (70.8 kg)     Physical Exam HENT:     Head: Normocephalic and atraumatic.     Mouth/Throat:     Pharynx: No oropharyngeal exudate.  Eyes:     Pupils: Pupils are equal, round, and reactive to light.  Cardiovascular:     Rate and Rhythm: Normal rate and regular rhythm.  Pulmonary:     Effort: No respiratory distress.     Breath  sounds: No wheezing.  Abdominal:     General: Bowel sounds are normal. There is no distension.     Palpations: Abdomen is soft. There is no mass.     Tenderness: There is no abdominal tenderness. There is no guarding or rebound.  Musculoskeletal:        General: No tenderness. Normal range of motion.     Cervical back: Normal range of motion and neck supple.  Skin:    General: Skin is warm.  Neurological:     Mental Status: She is alert and oriented to person, place, and time.  Psychiatric:        Mood and Affect: Affect normal.     LABORATORY DATA:  I  have reviewed the data as listed    Component Value Date/Time   NA 137 03/13/2024 1420   NA 142 12/16/2023 1305   NA 142 03/23/2013 0428   K 3.8 03/13/2024 1420   K 4.1 06/29/2013 0902   CL 103 03/13/2024 1420   CL 112 (H) 03/23/2013 0428   CO2 25 03/13/2024 1420   CO2 24 03/23/2013 0428   GLUCOSE 93 03/13/2024 1420   GLUCOSE 90 03/23/2013 0428   BUN 16 03/13/2024 1420   BUN 15 12/16/2023 1305   BUN 5 (L) 03/23/2013 0428   CREATININE 0.88 03/13/2024 1420   CREATININE 0.89 03/14/2023 0807   CREATININE 0.75 06/12/2013 0842   CALCIUM 8.6 (L) 03/13/2024 1420   CALCIUM 7.7 (L) 03/23/2013 0428   PROT 6.9 12/16/2023 1305   PROT 6.1 (L) 03/20/2013 1843   ALBUMIN 4.2 12/16/2023 1305   ALBUMIN 3.0 (L) 03/20/2013 1843   AST 55 (H) 12/16/2023 1305   AST 25 03/20/2013 1843   ALT 120 (H) 12/16/2023 1305   ALT 49 03/20/2013 1843   ALKPHOS 174 (H) 12/16/2023 1305   ALKPHOS 84 03/20/2013 1843   BILITOT 0.2 12/16/2023 1305   BILITOT 0.1 (L) 03/20/2013 1843   GFRNONAA >60 03/13/2024 1420   GFRNONAA >60 03/14/2023 0807   GFRNONAA >60 06/12/2013 0842   GFRAA 87 11/20/2019 0859   GFRAA >60 06/12/2013 0842    No results found for: SPEP, UPEP  Lab Results  Component Value Date   WBC 4.5 03/13/2024   NEUTROABS 2.6 03/13/2024   HGB 13.2 03/13/2024   HCT 39.4 03/13/2024   MCV 91.6 03/13/2024   PLT 230 03/13/2024       Chemistry      Component Value Date/Time   NA 137 03/13/2024 1420   NA 142 12/16/2023 1305   NA 142 03/23/2013 0428   K 3.8 03/13/2024 1420   K 4.1 06/29/2013 0902   CL 103 03/13/2024 1420   CL 112 (H) 03/23/2013 0428   CO2 25 03/13/2024 1420   CO2 24 03/23/2013 0428   BUN 16 03/13/2024 1420   BUN 15 12/16/2023 1305   BUN 5 (L) 03/23/2013 0428   CREATININE 0.88 03/13/2024 1420   CREATININE 0.89 03/14/2023 0807   CREATININE 0.75 06/12/2013 0842   GLU 82 10/16/2013 0000      Component Value Date/Time   CALCIUM 8.6 (L) 03/13/2024 1420   CALCIUM 7.7 (L) 03/23/2013 0428   ALKPHOS 174 (H) 12/16/2023 1305   ALKPHOS 84 03/20/2013 1843   AST 55 (H) 12/16/2023 1305   AST 25 03/20/2013 1843   ALT 120 (H) 12/16/2023 1305   ALT 49 03/20/2013 1843   BILITOT 0.2 12/16/2023 1305   BILITOT 0.1 (L) 03/20/2013 1843        ASSESSMENT & PLAN:  Acquired iron  deficiency anemia due to decreased absorption # 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 Again reminded on barimelt.   # Hemoglobin is 12.08 September 2023- I sat 20%, ferrint 127 but with gastric malabsorption- pending insurance Proceed  IV ferrahem.   # B12 deficiency- B12 injection q 4M. Reminded b12 pills at home.   # Hx of Stroke [confusion-Punctate acute infarct within the right cerebellar hemisphere -ischemic- June 2025]- on asprin 81/day-   1 week- labs prior sec to insurance  # DISPOSITION: # Note for work # B12 injection today # HOLD  Ferrahem today-  # Follow up in 6 months-MD;  1 week prior-labs cbc; bmp; vit D  25-OH levels; b12 levels/ferritin/iron  studies; possible ferrahem/ b12 injection-Dr.B  Cc;Dr.[name].        Cindy JONELLE Joe, MD 03/20/2024 1:35 PM

## 2024-03-20 NOTE — Progress Notes (Signed)
 No feraheme  today per MD

## 2024-03-20 NOTE — Progress Notes (Signed)
 Fatigue/weakness: YES Dyspena: NO  Light headedness: NO Blood in stool: NO   Pt states she had a stroke in June 2025. She is concerned about the cyst around her kidney and liver.

## 2024-03-20 NOTE — Addendum Note (Signed)
 Addended by: JOSHUA ALFONSO CROME on: 03/20/2024 01:42 PM   Modules accepted: Orders

## 2024-04-06 ENCOUNTER — Encounter: Payer: Self-pay | Admitting: Family Medicine

## 2024-04-07 ENCOUNTER — Other Ambulatory Visit: Payer: Self-pay

## 2024-04-07 MED ORDER — WEGOVY 1.7 MG/0.75ML ~~LOC~~ SOAJ: 1.7000 mg | SUBCUTANEOUS | 2 refills | Status: DC

## 2024-04-08 ENCOUNTER — Other Ambulatory Visit: Payer: Self-pay

## 2024-04-08 MED ORDER — WEGOVY 1.7 MG/0.75ML ~~LOC~~ SOAJ: 1.7000 mg | SUBCUTANEOUS | 2 refills | Status: DC

## 2024-04-10 ENCOUNTER — Other Ambulatory Visit: Payer: Self-pay | Admitting: Family Medicine

## 2024-04-10 MED ORDER — WEGOVY 1.7 MG/0.75ML ~~LOC~~ SOAJ: 1.7000 mg | SUBCUTANEOUS | 2 refills | Status: DC

## 2024-06-01 ENCOUNTER — Other Ambulatory Visit: Payer: Self-pay | Admitting: Family Medicine

## 2024-06-01 DIAGNOSIS — Z8673 Personal history of transient ischemic attack (TIA), and cerebral infarction without residual deficits: Secondary | ICD-10-CM

## 2024-06-01 NOTE — Telephone Encounter (Unsigned)
 Copied from CRM [id number]. Topic: Clinical - Medication Refill >> Jun 01, 2024  1:42 PM Emylou G wrote: Medication: aspirin  EC 81 MG tablet  90 day supply  Has the patient contacted their pharmacy? No (Agent: If no, request that the patient contact the pharmacy for the refill. If patient does not wish to contact the pharmacy document the reason why and proceed with request.) (Agent: If yes, when and what did the pharmacy advise?)  This is the patient's preferred pharmacy:   [facility] - [place], [place] - [address] [place] [address] Phone: [phone] Fax: [phone]  Is this the correct pharmacy for this prescription? Yes If no, delete pharmacy and type the correct one.   Has the prescription been filled recently? No  Is the patient out of the medication? Yes  Has the patient been seen for an appointment in the last year OR does the patient have an upcoming appointment? Yes  Can we respond through MyChart? Yes  Agent: Please be advised that Rx refills may take up to 3 business days. We ask that you follow-up with your pharmacy.

## 2024-06-02 MED ORDER — ASPIRIN 81 MG PO TBEC: 81.0000 mg | DELAYED_RELEASE_TABLET | Freq: Every day | ORAL | 3 refills | Status: AC

## 2024-06-02 NOTE — Telephone Encounter (Signed)
 Requested by patient . Future visit 08/14/24.  Requested Prescriptions  Pending Prescriptions Disp Refills   aspirin  EC 81 MG tablet 90 tablet 3    Sig: Take 1 tablet (81 mg total) by mouth daily. Swallow whole.     Analgesics:  NSAIDS - aspirin  Passed - 06/02/2024  3:07 PM      Passed - Cr in normal range and within 360 days    Creatinine  Date Value Ref Range Status  03/14/2023 0.89 0.44 - 1.00 mg/dL Final  87/94/7985 9.24 0.60 - 1.30 mg/dL Final   Creatinine, Ser  Date Value Ref Range Status  03/13/2024 0.88 0.44 - 1.00 mg/dL Final         Passed - eGFR is 10 or above and within 360 days    EGFR (African American)  Date Value Ref Range Status  06/12/2013 >60  Final   GFR calc Af Amer  Date Value Ref Range Status  11/20/2019 87 >59 mL/min/1.73 Final    Comment:    **[facility] currently reports eGFR in compliance with the current**   recommendations of the [employer]. [facility] will   update reporting as new guidelines are published from the NKF-ASN   Task force.    EGFR (Non-African Amer.)  Date Value Ref Range Status  06/12/2013 >60  Final    Comment:    eGFR values <80mL/min/1.73 m2 may be an indication of chronic kidney disease (CKD). Calculated eGFR is useful in patients with stable renal function. The eGFR calculation will not be reliable in acutely ill patients when serum creatinine is changing rapidly. It is not useful in  patients on dialysis. The eGFR calculation may not be applicable to patients at the low and high extremes of body sizes, pregnant women, and vegetarians.    GFR, Estimated  Date Value Ref Range Status  03/13/2024 >60 >60 mL/min Final    Comment:    (NOTE) Calculated using the CKD-EPI Creatinine Equation (2021)   03/14/2023 >60 >60 mL/min Final    Comment:    (NOTE) Calculated using the CKD-EPI Creatinine Equation (2021)    eGFR  Date Value Ref Range Status  12/16/2023 89 >59 mL/min/1.73 Final         Passed -  Patient is not pregnant      Passed - Valid encounter within last 12 months    Recent Outpatient Visits           2 months ago Renal cysts, acquired, bilateral   [facility] Gasper Nancyann BRAVO, MD   5 months ago Transient global amnesia   [facility] Gasper Nancyann BRAVO, MD

## 2024-06-19 ENCOUNTER — Other Ambulatory Visit: Payer: Self-pay | Admitting: Family Medicine

## 2024-06-19 NOTE — Telephone Encounter (Signed)
 Copied from CRM #[id number]. Topic: Clinical - Medication Refill >> Jun 19, 2024  2:03 PM Laura Murray wrote: Medication: semaglutide -weight management (WEGOVY ) 1.7 MG/0.75ML SOAJ SQ injection (90 day supply)  Has the patient contacted their pharmacy? No (Agent: If no, request that the patient contact the pharmacy for the refill. If patient does not wish to contact the pharmacy document the reason why and proceed with request.) stated seh have to contact dr. first (Agent: If yes, when and what did the pharmacy advise?)  This is the patient's preferred pharmacy:   [facility] - [place], [place] - [address] [place] [address] Phone: [phone] Fax: [phone]  Is this the correct pharmacy for this prescription? Yes If no, delete pharmacy and type the correct one.   Has the prescription been filled recently? Yes  Is the patient out of the medication? No, 1 pen left  Has the patient been seen for an appointment in the last year OR does the patient have an upcoming appointment? Yes  Can we respond through MyChart? Yes  Agent: Please be advised that Rx refills may take up to 3 business days. We ask that you follow-up with your pharmacy.

## 2024-06-22 MED ORDER — WEGOVY 1.7 MG/0.75ML ~~LOC~~ SOAJ: 1.7000 mg | SUBCUTANEOUS | 0 refills | Status: AC

## 2024-06-22 NOTE — Telephone Encounter (Signed)
 Requested Prescriptions  Pending Prescriptions Disp Refills   semaglutide -weight management (WEGOVY ) 1.7 MG/0.75ML SOAJ SQ injection 9 mL 0    Sig: Inject 1.7 mg into the skin once a week.     Endocrinology:  Diabetes - GLP-1 Receptor Agonists - semaglutide  Failed - 06/22/2024  4:18 PM      Failed - HBA1C in normal range and within 180 days    Hgb A1c MFr Bld  Date Value Ref Range Status  12/09/2023 5.1 4.8 - 5.6 % Final    Comment:    (NOTE) Diagnosis of Diabetes The following HbA1c ranges recommended by the American Diabetes Association (ADA) may be used as an aid in the diagnosis of diabetes mellitus.  Hemoglobin             Suggested A1C NGSP%              Diagnosis  <5.7                   Non Diabetic  5.7-6.4                Pre-Diabetic  >6.4                   Diabetic  <7.0                   Glycemic control for                       adults with diabetes.           Passed - Cr in normal range and within 360 days    Creatinine  Date Value Ref Range Status  03/14/2023 0.89 0.44 - 1.00 mg/dL Final  87/94/7985 9.24 0.60 - 1.30 mg/dL Final   Creatinine, Ser  Date Value Ref Range Status  03/13/2024 0.88 0.44 - 1.00 mg/dL Final         Passed - Valid encounter within last 6 months    Recent Outpatient Visits           3 months ago Renal cysts, acquired, bilateral   [facility] Gasper Nancyann BRAVO, MD   6 months ago Transient global amnesia   [facility] Gasper Nancyann BRAVO, MD

## 2024-06-25 ENCOUNTER — Other Ambulatory Visit: Payer: Self-pay | Admitting: Obstetrics and Gynecology

## 2024-06-25 DIAGNOSIS — Z1231 Encounter for screening mammogram for malignant neoplasm of breast: Secondary | ICD-10-CM

## 2024-07-27 ENCOUNTER — Other Ambulatory Visit: Payer: Self-pay

## 2024-07-27 DIAGNOSIS — R7989 Other specified abnormal findings of blood chemistry: Secondary | ICD-10-CM

## 2024-07-29 ENCOUNTER — Ambulatory Visit
Admission: RE | Admit: 2024-07-29 | Discharge: 2024-07-29 | Disposition: A | Discharge status: Home / Self Care | Source: Ambulatory Visit

## 2024-07-29 DIAGNOSIS — R7989 Other specified abnormal findings of blood chemistry: Secondary | ICD-10-CM | POA: Insufficient documentation

## 2024-07-31 ENCOUNTER — Ambulatory Visit

## 2024-07-31 ENCOUNTER — Ambulatory Visit
Admission: RE | Admit: 2024-07-31 | Discharge: 2024-07-31 | Disposition: A | Source: Ambulatory Visit | Attending: Obstetrics and Gynecology | Admitting: Obstetrics and Gynecology

## 2024-07-31 DIAGNOSIS — Z1231 Encounter for screening mammogram for malignant neoplasm of breast: Secondary | ICD-10-CM | POA: Insufficient documentation

## 2024-08-14 ENCOUNTER — Ambulatory Visit: Admitting: Family Medicine

## 2024-08-14 ENCOUNTER — Encounter: Payer: Self-pay | Admitting: Family Medicine

## 2024-08-14 VITALS — BP 132/72 | HR 68 | Resp 16 | Ht 64.0 in | Wt 150.7 lb

## 2024-08-14 DIAGNOSIS — Z8673 Personal history of transient ischemic attack (TIA), and cerebral infarction without residual deficits: Secondary | ICD-10-CM

## 2024-08-14 DIAGNOSIS — Z1159 Encounter for screening for other viral diseases: Secondary | ICD-10-CM

## 2024-08-14 DIAGNOSIS — E663 Overweight: Secondary | ICD-10-CM

## 2024-08-14 DIAGNOSIS — D508 Other iron deficiency anemias: Secondary | ICD-10-CM

## 2024-08-14 DIAGNOSIS — I864 Gastric varices: Secondary | ICD-10-CM

## 2024-08-14 DIAGNOSIS — E162 Hypoglycemia, unspecified: Secondary | ICD-10-CM

## 2024-08-14 DIAGNOSIS — E538 Deficiency of other specified B group vitamins: Secondary | ICD-10-CM

## 2024-08-14 DIAGNOSIS — Z124 Encounter for screening for malignant neoplasm of cervix: Secondary | ICD-10-CM

## 2024-08-14 DIAGNOSIS — E559 Vitamin D deficiency, unspecified: Secondary | ICD-10-CM

## 2024-08-14 DIAGNOSIS — G4700 Insomnia, unspecified: Secondary | ICD-10-CM

## 2024-08-14 DIAGNOSIS — Z1211 Encounter for screening for malignant neoplasm of colon: Secondary | ICD-10-CM

## 2024-08-14 DIAGNOSIS — R7989 Other specified abnormal findings of blood chemistry: Secondary | ICD-10-CM

## 2024-08-14 DIAGNOSIS — Z23 Encounter for immunization: Secondary | ICD-10-CM

## 2024-08-14 MED ORDER — VITAMIN D (ERGOCALCIFEROL) 1.25 MG (50000 UNIT) PO CAPS: ORAL_CAPSULE | ORAL | 3 refills | Status: AC

## 2024-08-14 MED ORDER — TRAZODONE HCL 50 MG PO TABS: 50.0000 mg | ORAL_TABLET | Freq: Every evening | ORAL | 2 refills | Status: AC | PRN

## 2024-08-14 NOTE — Progress Notes (Signed)
 "     Established patient visit   Patient: Laura Murray   DOB: 12/12/1965   59 y.o. Female  MRN: [id number] Visit Date: 08/14/2024  Today's healthcare provider: Nancyann Perry, MD   Chief Complaint  Patient presents with   Follow-up    .6 month f/u.SABRA No to Vaccines   Subjective    Discussed the use of AI scribe software for clinical note transcription with the patient, who gave verbal consent to proceed.  History of Present Illness   Laura Murray is a 59 year old female who presents for a routine checkup and follow-up on Wegovy  treatment.  She has been on Wegovy  1.7 mg but has not experienced any weight loss, maintaining a stable weight around 150 pounds. She desires to reach 140 pounds but struggles with energy levels, which she attributes to a past stroke. Fatigue makes it challenging for her to exercise, although her husband encourages her to continue to build stamina.  She experiences difficulty sleeping and only sleeps well when taking tramadol. She has had sleep issues for years and finds that trazodone  allows her to sleep for about four hours, which she considers better than no sleep at all. She currently takes one trazodone  tablet at night.  She is taking a vitamin D  supplement and a B12 supplement, which is a dissolvable tablet of 1000 mcg. She recently had lab work done at the GI doctor's office in preparation for a colonoscopy scheduled for February 20th. The lab work included checking her sugar levels and liver function, and she mentions an ultrasound was performed. She recalls no prior liver function tests since her gastric bypass surgery in 2015.  She feels tired and frustrated due to her inability to exercise as she used to before her stroke. She confirms difficulty sleeping without medication.     Lab Results  Component Value Date   VD25OH 73.03 03/13/2024     Medications: Show/hide medication list[1] Review of Systems  Constitutional:   Negative for appetite change, chills, fatigue and fever.  Respiratory:  Negative for chest tightness and shortness of breath.   Cardiovascular:  Negative for chest pain and palpitations.  Gastrointestinal:  Negative for abdominal pain, nausea and vomiting.  Neurological:  Negative for dizziness and weakness.       Objective    BP 132/72 (BP Location: Left Arm, Patient Position: Sitting, Cuff Size: Normal)   Pulse 68   Resp 16   Ht 5' 4 (1.626 m)   Wt 150 lb 11.2 oz (68.4 kg)   LMP 02/26/2018   SpO2 100%   BMI 25.87 kg/m   Physical Exam   General appearance: Well developed, well nourished female, cooperative and in no acute distress Head: Normocephalic, without obvious abnormality, atraumatic Respiratory: Respirations even and unlabored, normal respiratory rate Extremities: All extremities are intact.  Skin: Skin color, texture, turgor normal. No rashes seen  Psych: Appropriate mood and affect. Neurologic: Mental status: Alert, oriented to person, place, and time, thought content appropriate.    Assessment & Plan        Insomnia Chronic insomnia with difficulty maintaining sleep. Trazodone  100 mg provides approximately four hours of sleep without significant side effects. - Increase trazodone  to 200 mg at night if needed for improved sleep duration.  Vitamin D  deficiency Managed with supplementation. - Continue vitamin D  supplementation.  Vitamin B12 deficiency Managed with sublingual B12 tablets. - Continue B12 supplementation.  Obesity Weight stable at 150 lbs. On Wegovy  1.7 mg with  no significant side effects. No weight loss observed, but weight is healthy. Discussed exercise benefits for weight management and health. Higher Wegovy  doses may increase side effects, so current dose maintained. - Continue Wegovy  1.7 mg. - Encouraged regular exercise to aid in weight management.  History of cerebrovascular accident Residual fatigue impacts exercise tolerance.  Discussed Wegovy 's potential benefits for arterial health. - Encouraged gradual increase in physical activity as tolerated.  General Health Maintenance Up to date on colonoscopy screening. Discussed importance of Heplisav vaccine for hepatitis protection. - Will schedule Heplisav vaccination at next visit.     Return in about 6 months (around 02/11/2025).     Nancyann Perry, MD  [facility] [phone] (phone) [phone] (fax)  [facility]    [1]  Outpatient Medications Prior to Visit  Medication Sig   acetaminophen  (TYLENOL ) 500 MG tablet Take 1,000 mg by mouth every 6 (six) hours as needed for moderate pain or headache.    aspirin  EC 81 MG tablet Take 1 tablet (81 mg total) by mouth daily. Swallow whole.   semaglutide -weight management (WEGOVY ) 1.7 MG/0.75ML SOAJ SQ injection Inject 1.7 mg into the skin once a week.   [DISCONTINUED] traZODone  (DESYREL ) 50 MG tablet Take 1-2 tablets (50-100 mg total) by mouth at bedtime as needed for sleep.   [DISCONTINUED] Vitamin D , Ergocalciferol , (DRISDOL ) 1.25 MG (50000 UNIT) CAPS capsule TAKE 1 CAPSULE ONCE WEEKLY   No facility-administered medications prior to visit.   "

## 2024-08-14 NOTE — Patient Instructions (Signed)
 SABRA  Please review the attached list of medications and notify my office if there are any errors.   . Please bring all of your medications to every appointment so we can make sure that our medication list is the same as yours.

## 2024-08-28 ENCOUNTER — Encounter: Admission: RE | Payer: Self-pay | Source: Home / Self Care

## 2024-08-28 ENCOUNTER — Ambulatory Visit: Admission: RE | Admit: 2024-08-28 | Source: Home / Self Care | Admitting: Gastroenterology

## 2024-09-11 ENCOUNTER — Other Ambulatory Visit

## 2024-09-18 ENCOUNTER — Ambulatory Visit: Admitting: Internal Medicine

## 2024-09-18 ENCOUNTER — Ambulatory Visit

## 2025-02-12 ENCOUNTER — Ambulatory Visit: Admitting: Family Medicine
